# Patient Record
Sex: Female | Born: 1975
Health system: Southern US, Community
[De-identification: ages and names within clinical notes are randomized; demographics above are authoritative.]

## PROBLEM LIST (undated history)

## (undated) DIAGNOSIS — F319 Bipolar disorder, unspecified: Secondary | ICD-10-CM

## (undated) DIAGNOSIS — D219 Benign neoplasm of connective and other soft tissue, unspecified: Secondary | ICD-10-CM

## (undated) DIAGNOSIS — F419 Anxiety disorder, unspecified: Secondary | ICD-10-CM

## (undated) DIAGNOSIS — F329 Major depressive disorder, single episode, unspecified: Secondary | ICD-10-CM

## (undated) DIAGNOSIS — F32A Depression, unspecified: Secondary | ICD-10-CM

## (undated) DIAGNOSIS — J45909 Unspecified asthma, uncomplicated: Secondary | ICD-10-CM

## (undated) HISTORY — DX: Major depressive disorder, single episode, unspecified: F32.9

## (undated) HISTORY — DX: Unspecified asthma, uncomplicated: J45.909

## (undated) HISTORY — DX: Benign neoplasm of connective and other soft tissue, unspecified: D21.9

## (undated) HISTORY — DX: Bipolar disorder, unspecified: F31.9

## (undated) HISTORY — DX: Anxiety disorder, unspecified: F41.9

## (undated) HISTORY — DX: Depression, unspecified: F32.A

---

## 1998-02-04 ENCOUNTER — Emergency Department (HOSPITAL_COMMUNITY): Admission: EM | Admit: 1998-02-04 | Discharge: 1998-02-04 | Payer: Self-pay | Admitting: Emergency Medicine

## 1998-12-07 ENCOUNTER — Emergency Department (HOSPITAL_COMMUNITY): Admission: EM | Admit: 1998-12-07 | Discharge: 1998-12-07 | Payer: Self-pay | Admitting: Emergency Medicine

## 1999-02-26 ENCOUNTER — Inpatient Hospital Stay (HOSPITAL_COMMUNITY): Admission: AD | Admit: 1999-02-26 | Discharge: 1999-02-26 | Payer: Self-pay | Admitting: Obstetrics

## 1999-04-30 ENCOUNTER — Emergency Department (HOSPITAL_COMMUNITY): Admission: EM | Admit: 1999-04-30 | Discharge: 1999-04-30 | Payer: Self-pay | Admitting: *Deleted

## 1999-04-30 ENCOUNTER — Emergency Department (HOSPITAL_COMMUNITY): Admission: EM | Admit: 1999-04-30 | Discharge: 1999-05-01 | Payer: Self-pay | Admitting: Emergency Medicine

## 2000-03-10 ENCOUNTER — Emergency Department (HOSPITAL_COMMUNITY): Admission: EM | Admit: 2000-03-10 | Discharge: 2000-03-10 | Payer: Self-pay | Admitting: Emergency Medicine

## 2000-03-10 ENCOUNTER — Encounter: Payer: Self-pay | Admitting: Emergency Medicine

## 2000-03-29 ENCOUNTER — Inpatient Hospital Stay (HOSPITAL_COMMUNITY): Admission: AD | Admit: 2000-03-29 | Discharge: 2000-03-29 | Payer: Self-pay | Admitting: Obstetrics & Gynecology

## 2000-08-03 ENCOUNTER — Emergency Department (HOSPITAL_COMMUNITY): Admission: EM | Admit: 2000-08-03 | Discharge: 2000-08-04 | Payer: Self-pay | Admitting: Emergency Medicine

## 2001-04-03 ENCOUNTER — Emergency Department (HOSPITAL_COMMUNITY): Admission: EM | Admit: 2001-04-03 | Discharge: 2001-04-03 | Payer: Self-pay | Admitting: Emergency Medicine

## 2001-04-04 ENCOUNTER — Emergency Department (HOSPITAL_COMMUNITY): Admission: EM | Admit: 2001-04-04 | Discharge: 2001-04-04 | Payer: Self-pay

## 2001-05-20 ENCOUNTER — Inpatient Hospital Stay (HOSPITAL_COMMUNITY): Admission: AD | Admit: 2001-05-20 | Discharge: 2001-05-20 | Payer: Self-pay | Admitting: Obstetrics

## 2001-05-20 ENCOUNTER — Encounter: Payer: Self-pay | Admitting: Obstetrics

## 2001-07-18 ENCOUNTER — Other Ambulatory Visit: Admission: RE | Admit: 2001-07-18 | Discharge: 2001-07-18 | Payer: Self-pay | Admitting: Obstetrics and Gynecology

## 2001-08-07 HISTORY — PX: TUBAL LIGATION: SHX77

## 2001-09-09 ENCOUNTER — Other Ambulatory Visit: Admission: RE | Admit: 2001-09-09 | Discharge: 2001-09-09 | Payer: Self-pay | Admitting: Obstetrics and Gynecology

## 2001-12-12 ENCOUNTER — Inpatient Hospital Stay (HOSPITAL_COMMUNITY): Admission: AD | Admit: 2001-12-12 | Discharge: 2001-12-12 | Payer: Self-pay | Admitting: Obstetrics and Gynecology

## 2001-12-19 ENCOUNTER — Inpatient Hospital Stay (HOSPITAL_COMMUNITY): Admission: AD | Admit: 2001-12-19 | Discharge: 2001-12-22 | Payer: Self-pay | Admitting: Obstetrics and Gynecology

## 2001-12-19 ENCOUNTER — Encounter (INDEPENDENT_AMBULATORY_CARE_PROVIDER_SITE_OTHER): Payer: Self-pay

## 2002-12-18 ENCOUNTER — Emergency Department (HOSPITAL_COMMUNITY): Admission: EM | Admit: 2002-12-18 | Discharge: 2002-12-18 | Payer: Self-pay | Admitting: Emergency Medicine

## 2002-12-18 ENCOUNTER — Encounter: Payer: Self-pay | Admitting: Emergency Medicine

## 2003-03-17 ENCOUNTER — Emergency Department (HOSPITAL_COMMUNITY): Admission: EM | Admit: 2003-03-17 | Discharge: 2003-03-17 | Payer: Self-pay | Admitting: Emergency Medicine

## 2005-05-02 ENCOUNTER — Emergency Department (HOSPITAL_COMMUNITY): Admission: EM | Admit: 2005-05-02 | Discharge: 2005-05-02 | Payer: Self-pay | Admitting: Emergency Medicine

## 2005-07-23 ENCOUNTER — Emergency Department (HOSPITAL_COMMUNITY): Admission: EM | Admit: 2005-07-23 | Discharge: 2005-07-23 | Payer: Self-pay | Admitting: Emergency Medicine

## 2005-10-06 ENCOUNTER — Emergency Department (HOSPITAL_COMMUNITY): Admission: EM | Admit: 2005-10-06 | Discharge: 2005-10-06 | Payer: Self-pay | Admitting: Emergency Medicine

## 2006-06-10 ENCOUNTER — Inpatient Hospital Stay (HOSPITAL_COMMUNITY): Admission: AD | Admit: 2006-06-10 | Discharge: 2006-06-10 | Payer: Self-pay | Admitting: Family Medicine

## 2006-08-01 ENCOUNTER — Emergency Department (HOSPITAL_COMMUNITY): Admission: EM | Admit: 2006-08-01 | Discharge: 2006-08-01 | Payer: Self-pay | Admitting: Emergency Medicine

## 2006-11-04 ENCOUNTER — Emergency Department (HOSPITAL_COMMUNITY): Admission: EM | Admit: 2006-11-04 | Discharge: 2006-11-04 | Payer: Self-pay | Admitting: Emergency Medicine

## 2007-12-12 ENCOUNTER — Emergency Department (HOSPITAL_COMMUNITY): Admission: EM | Admit: 2007-12-12 | Discharge: 2007-12-12 | Payer: Self-pay | Admitting: Emergency Medicine

## 2009-06-01 ENCOUNTER — Emergency Department (HOSPITAL_COMMUNITY): Admission: EM | Admit: 2009-06-01 | Discharge: 2009-06-01 | Payer: Self-pay | Admitting: Emergency Medicine

## 2009-12-30 ENCOUNTER — Ambulatory Visit (HOSPITAL_COMMUNITY): Admission: RE | Admit: 2009-12-30 | Discharge: 2009-12-30 | Payer: Self-pay | Admitting: Psychiatry

## 2010-01-05 ENCOUNTER — Other Ambulatory Visit (HOSPITAL_COMMUNITY): Admission: RE | Admit: 2010-01-05 | Discharge: 2010-01-20 | Payer: Self-pay | Admitting: Psychiatry

## 2010-01-06 ENCOUNTER — Ambulatory Visit (HOSPITAL_COMMUNITY): Payer: Self-pay | Admitting: Psychiatry

## 2010-01-24 ENCOUNTER — Ambulatory Visit (HOSPITAL_COMMUNITY): Payer: Self-pay | Admitting: Psychiatry

## 2010-01-27 ENCOUNTER — Ambulatory Visit: Payer: Self-pay | Admitting: Psychiatry

## 2010-01-31 ENCOUNTER — Ambulatory Visit (HOSPITAL_COMMUNITY): Payer: Self-pay | Admitting: Psychiatry

## 2010-02-21 ENCOUNTER — Ambulatory Visit (HOSPITAL_COMMUNITY): Payer: Self-pay | Admitting: Psychiatry

## 2010-08-29 ENCOUNTER — Ambulatory Visit (HOSPITAL_COMMUNITY)
Admission: RE | Admit: 2010-08-29 | Discharge: 2010-08-29 | Payer: Self-pay | Source: Home / Self Care | Attending: Psychiatry | Admitting: Psychiatry

## 2010-09-01 ENCOUNTER — Other Ambulatory Visit (HOSPITAL_COMMUNITY)
Admission: RE | Admit: 2010-09-01 | Discharge: 2010-09-06 | Payer: Self-pay | Source: Home / Self Care | Attending: Psychiatry | Admitting: Psychiatry

## 2010-09-06 ENCOUNTER — Ambulatory Visit (HOSPITAL_COMMUNITY)
Admission: RE | Admit: 2010-09-06 | Discharge: 2010-09-06 | Payer: Self-pay | Source: Home / Self Care | Attending: Psychiatry | Admitting: Psychiatry

## 2010-09-07 ENCOUNTER — Other Ambulatory Visit (HOSPITAL_COMMUNITY): Payer: PRIVATE HEALTH INSURANCE | Attending: Psychiatry | Admitting: Psychiatry

## 2010-09-08 ENCOUNTER — Other Ambulatory Visit (HOSPITAL_COMMUNITY): Payer: PRIVATE HEALTH INSURANCE | Admitting: Psychiatry

## 2010-09-09 ENCOUNTER — Other Ambulatory Visit (HOSPITAL_COMMUNITY): Payer: PRIVATE HEALTH INSURANCE | Admitting: Psychiatry

## 2010-09-12 ENCOUNTER — Other Ambulatory Visit (HOSPITAL_COMMUNITY): Payer: PRIVATE HEALTH INSURANCE | Admitting: Psychiatry

## 2010-09-13 ENCOUNTER — Other Ambulatory Visit (HOSPITAL_COMMUNITY): Payer: PRIVATE HEALTH INSURANCE | Admitting: Psychiatry

## 2010-09-14 ENCOUNTER — Other Ambulatory Visit (HOSPITAL_COMMUNITY): Payer: PRIVATE HEALTH INSURANCE | Admitting: Psychiatry

## 2010-09-15 ENCOUNTER — Other Ambulatory Visit (HOSPITAL_COMMUNITY): Payer: PRIVATE HEALTH INSURANCE | Admitting: Psychiatry

## 2010-09-16 ENCOUNTER — Other Ambulatory Visit (HOSPITAL_COMMUNITY): Payer: PRIVATE HEALTH INSURANCE | Admitting: Psychiatry

## 2010-09-19 ENCOUNTER — Other Ambulatory Visit (HOSPITAL_COMMUNITY): Payer: PRIVATE HEALTH INSURANCE | Admitting: Psychiatry

## 2010-09-20 ENCOUNTER — Other Ambulatory Visit (HOSPITAL_COMMUNITY): Payer: PRIVATE HEALTH INSURANCE | Admitting: Psychiatry

## 2010-09-21 ENCOUNTER — Other Ambulatory Visit (HOSPITAL_COMMUNITY): Payer: PRIVATE HEALTH INSURANCE | Admitting: Psychiatry

## 2010-09-22 ENCOUNTER — Other Ambulatory Visit (HOSPITAL_COMMUNITY): Payer: PRIVATE HEALTH INSURANCE | Admitting: Psychiatry

## 2010-09-23 ENCOUNTER — Other Ambulatory Visit (HOSPITAL_COMMUNITY): Payer: PRIVATE HEALTH INSURANCE | Admitting: Psychiatry

## 2010-09-26 ENCOUNTER — Other Ambulatory Visit (HOSPITAL_COMMUNITY): Payer: PRIVATE HEALTH INSURANCE | Admitting: Psychiatry

## 2010-09-27 ENCOUNTER — Other Ambulatory Visit (HOSPITAL_COMMUNITY): Payer: PRIVATE HEALTH INSURANCE | Admitting: Psychiatry

## 2010-12-23 NOTE — Discharge Summary (Signed)
[facility]  Patient:    Alexis Beck, Alexis Beck Visit Number: [id number] MRN: [id number]          Service Type: OBS Location: [facility] Attending Physician:  Leonard Schwartz Dictated by:   Vance Gather Duplantis, C.N.M. Adm. Date:  12/19/01 Disc. Date: 12/22/01                             Discharge Summary  ADMISSION DIAGNOSES:          1. Intrauterine pregnancy at term.                               2. Active labor.                               3. Multiparity.                               4. Desires bilateral tubal ligation for                                  sterilization.  DISCHARGE DIAGNOSES:          1. Intrauterine pregnancy at term.                               2. Active labor.                               3. Multiparity.                               4. Desires bilateral tubal ligation for                                  sterilization.                               5. Bottle feeding.  PROCEDURE:                    1. Normal spontaneous vaginal delivery of a                                  viable female infant named Alexis Beck who had                                  Apgars of 8 and 9 and weighed 7 pounds 4                                  ounces on Dec 19, 2001 attended by Alexis Lias  Mayford Beck, CNM.                               2. Bilateral tubal ligation for sterilization by                                  Dr. Leonard Schwartz on Dec 20, 2001                                  with no complications.  Please see operative                                  note for details.  HOSPITAL COURSE:              Alexis Beck is a 35 year old gravida 4, para 2-0-1-2 at 60 5/7 weeks who presents complaining of uterine contractions every two to three minutes and was admitted in early active labor.  She progressed rapidly to complete delivery of a viable female infant named Alexis Beck on Dec 20, 2001 who weighed 7  pounds 4 ounces and had Apgars of 8 and 9 attended by Alexis Beck, CNM.  She underwent bilateral tubal ligation for sterilization on the same day by Dr. Leonard Schwartz, also without complications.  Postoperatively and postpartum she has done well.  She is ambulating, voiding, and eating without difficulty.  She is bottle feeding also without difficulty.  She is deemed ready for discharge today.  DISCHARGE INSTRUCTIONS:       As per the [facility] handout.  DISCHARGE MEDICATIONS:        1. Motrin 600 mg p.o. q.6h. p.r.n. for pain.                               2. Tylox one to two p.o. q.4-6h. p.r.n. for                                  pain.                               3. Prenatal vitamins daily.  DISCHARGE LABORATORIES:       Hemoglobin 11.0, WBC 7.4, platelets 189,000.  DISCHARGE FOLLOWUP:           [facility] in six weeks or p.r.n. Dictated by:   Vance Gather Duplantis, C.N.M. Attending Physician:  Leonard Schwartz DD:  12/22/01 TD:  12/24/01 Job: [id number]

## 2010-12-23 NOTE — H&P (Signed)
[facility]  Patient:    Alexis Beck, Alexis Beck Visit Number: [id number] MRN: [id number]          Service Type: OBS Location: [facility] Attending Physician:  Shaune Spittle Dictated by:   Wynelle Bourgeois, CNM Admit Date:  12/12/2001 Discharge Date: 12/12/2001                           History and Physical  HISTORY OF PRESENT ILLNESS:   This is a 35 year old G4, P2-0-1-2, at 38-5/7ths weeks, who presents with complaints of regular uterine contractions x2 hours.  She denies leaking or bleeding and reports positive fetal movement. The pregnancy has been remarkable for: 1. Sickle cell trait.  2. History of rapid labor.  3. History of hypertension with her first pregnancy.  4. History of abnormal Pap.  5. Asthma.  6. Group B strep negative.  OBSTETRICAL HISTORY:          Remarkable for vaginal deliveries in 1993 and 1995 of term infants with no complications other than elevated blood pressure in the first pregnancy.  She had an elective abortion in 1997.  PRENATAL LABORATORIES:        Hemoglobin 13, platelets 188.  Blood type B positive.  Antibody screen negative.  Sickle cell negative.  RPR nonreactive.  Rubella immune.  HBsAg negative.  HIV nonreactive.  Pap test ASCUS.  Gonorrhea negative.  Chlamydia negative.  Three-hour GTT within normal limits.  AFP within normal limits.  Group B strep negative.  MEDICAL HISTORY:              Remarkable for questionable elevated blood pressure in her first pregnancy, history of abnormal Pap in 1991 which was normal after that, history of childhood varicella, history of asthma for which she uses an albuterol inhaler as needed.  FAMILY HISTORY:               Remarkable for grandmother and grandfather and mother with heart disease, mother with hypertension, sister with lung disease, diabetes in her grandmother, renal failure in her grandmother, bladder cancer in her grandmother, lung cancer in her grandfather, two  cousins with psychiatric diseases.  GENETIC HISTORY:              Remarkable for patient, father, brother and daughter with sickle cell trait.  SOCIAL HISTORY:               The patient is married to [employer] who involved and supportive.  She is of the [place] faith.  She denies any alcohol, tobacco or drug use.  OBJECTIVE DATA:  VITAL SIGNS:                  Stable, afebrile.  HEENT:                        Within normal limits.  NECK:                         Thyroid normal, not enlarged.  CHEST:                        Clear to auscultation.  HEART RATE:                   Regular rate and rhythm.  ABDOMEN:  Gravid at 38 cm.  EFM shows a reactive fetal heart rate with uterine contractions every 2-3 minutes.  Amniotomy was performed for clear fluid.  PELVIC:                       Cervical exam 4, 80%, -1 station with a vertex presentation.  EXTREMITIES:                  Within normal limits.  ASSESSMENT: 1. Intrauterine pregnancy at 38-5/7ths weeks. 2. Active labor.  PLAN: 1. Admit to birthing suites, Dr. Pennie Rushing notified. 2. Routine CNM orders. Dictated by:   Wynelle Bourgeois, CNM Attending Physician:  Shaune Spittle DD:  12/19/01 TD:  12/20/01 Job: [id number]

## 2010-12-23 NOTE — Op Note (Signed)
[facility]  Patient:    Alexis Beck, Alexis Beck Visit Number: [id number] MRN: [id number]          Service Type: OBS Location: [facility] Attending Physician:  Leonard Schwartz Dictated by:   Janine Limbo, M.D. Proc. Date: 12/20/01                             Operative Report  PREOPERATIVE DIAGNOSES:       1. Postpartum day 0.                               2. Desires permanent sterilization.  POSTOPERATIVE DIAGNOSES:      1. Postpartum day 0.                               2. Desires permanent sterilization.  PROCEDURE:                    Postpartum bilateral tubal ligation.  SURGEON:                      Janine Limbo, M.D.  ANESTHESIA:                   General.  DISPOSITION:                  [name] is a 35 year old female now para 3-0-1-3 who had a vaginal delivery of a healthy female infant earlier today.  She desires permanent sterilization.  She understands the indications for her procedure and she accepts the risks of, but not limited to, anesthetic complications, bleeding, infections, possible damage to the surrounding organs, and possible tubal failure (17 per 1000).  FINDINGS:                     The fallopian tubes were normal bilaterally.  PROCEDURE:                    The patient was taken to the operating room where a general anesthetic was given.  The patients abdomen was prepped with multiple layers of Betadine and then sterilely draped.  The subumbilical area was injected with 10 cc of 0.5% Marcaine with epinephrine.  A subumbilical incision was made and the incision was carried sharply through the subcutaneous tissue, the fascia, and the anterior peritoneum.  The left fallopian tube was identified and followed to its fimbriated end.  A knuckle of tube was made on the left using a free tie and then a suture ligature of 0 plain catgut.  The knuckle of tube thus made was excised.  Hemostasis was adequate.   An identical procedure was carried out on the opposite side.  Once again, hemostasis was adequate.  All instruments were removed.  The fascia and the anterior peritoneum were reapproximated in the midline using 2-0 Vicryl. The skin was reapproximated using a subcuticular suture of 4-0 Vicryl. Sponge, needle, and instrument counts were correct on two occasions.  The estimated blood loss was 3 cc.  The patient tolerated her procedure well.  She was awakened from her anesthetic and taken to the recovery room in stable condition. Dictated by:   Janine Limbo, M.D. Attending Physician:  Leonard Schwartz DD:  12/20/01 TD:  12/23/01 Job: [phone] [id number]

## 2011-03-13 ENCOUNTER — Ambulatory Visit (HOSPITAL_COMMUNITY)
Admission: RE | Admit: 2011-03-13 | Discharge: 2011-03-13 | Disposition: A | Discharge status: Home / Self Care | Payer: 59 | Attending: Psychiatry | Admitting: Psychiatry

## 2011-03-13 DIAGNOSIS — F331 Major depressive disorder, recurrent, moderate: Secondary | ICD-10-CM | POA: Insufficient documentation

## 2011-03-14 ENCOUNTER — Other Ambulatory Visit (HOSPITAL_COMMUNITY): Payer: PRIVATE HEALTH INSURANCE | Admitting: Psychiatry

## 2011-03-15 ENCOUNTER — Other Ambulatory Visit (HOSPITAL_COMMUNITY): Payer: PRIVATE HEALTH INSURANCE | Attending: Psychiatry | Admitting: Psychiatry

## 2011-03-15 DIAGNOSIS — F331 Major depressive disorder, recurrent, moderate: Secondary | ICD-10-CM | POA: Insufficient documentation

## 2011-03-15 DIAGNOSIS — J45909 Unspecified asthma, uncomplicated: Secondary | ICD-10-CM | POA: Insufficient documentation

## 2011-03-15 DIAGNOSIS — IMO0002 Reserved for concepts with insufficient information to code with codable children: Secondary | ICD-10-CM | POA: Insufficient documentation

## 2011-03-15 DIAGNOSIS — Z6379 Other stressful life events affecting family and household: Secondary | ICD-10-CM | POA: Insufficient documentation

## 2011-03-16 ENCOUNTER — Other Ambulatory Visit (HOSPITAL_COMMUNITY): Payer: PRIVATE HEALTH INSURANCE | Admitting: Psychiatry

## 2011-03-17 ENCOUNTER — Other Ambulatory Visit (HOSPITAL_COMMUNITY): Payer: PRIVATE HEALTH INSURANCE | Admitting: Psychiatry

## 2011-03-20 ENCOUNTER — Other Ambulatory Visit (HOSPITAL_BASED_OUTPATIENT_CLINIC_OR_DEPARTMENT_OTHER): Payer: PRIVATE HEALTH INSURANCE | Admitting: Psychiatry

## 2011-03-20 DIAGNOSIS — F319 Bipolar disorder, unspecified: Secondary | ICD-10-CM

## 2011-03-21 ENCOUNTER — Other Ambulatory Visit (HOSPITAL_COMMUNITY): Payer: PRIVATE HEALTH INSURANCE | Admitting: Psychiatry

## 2011-03-22 ENCOUNTER — Other Ambulatory Visit (HOSPITAL_COMMUNITY): Payer: PRIVATE HEALTH INSURANCE | Admitting: Psychiatry

## 2011-03-23 ENCOUNTER — Other Ambulatory Visit (HOSPITAL_COMMUNITY): Payer: PRIVATE HEALTH INSURANCE | Admitting: Psychiatry

## 2011-03-24 ENCOUNTER — Other Ambulatory Visit (HOSPITAL_COMMUNITY): Payer: PRIVATE HEALTH INSURANCE | Admitting: Psychiatry

## 2011-03-27 ENCOUNTER — Other Ambulatory Visit (HOSPITAL_COMMUNITY): Payer: PRIVATE HEALTH INSURANCE | Admitting: Psychiatry

## 2011-03-28 ENCOUNTER — Other Ambulatory Visit (HOSPITAL_COMMUNITY): Payer: PRIVATE HEALTH INSURANCE | Admitting: Psychiatry

## 2011-03-29 ENCOUNTER — Other Ambulatory Visit (HOSPITAL_COMMUNITY): Payer: PRIVATE HEALTH INSURANCE | Admitting: Psychiatry

## 2011-03-30 ENCOUNTER — Other Ambulatory Visit (HOSPITAL_COMMUNITY): Payer: PRIVATE HEALTH INSURANCE | Admitting: Psychiatry

## 2011-03-31 ENCOUNTER — Other Ambulatory Visit (HOSPITAL_COMMUNITY): Payer: PRIVATE HEALTH INSURANCE | Admitting: Psychiatry

## 2011-04-03 ENCOUNTER — Other Ambulatory Visit (HOSPITAL_COMMUNITY): Payer: PRIVATE HEALTH INSURANCE | Admitting: Psychiatry

## 2011-04-04 ENCOUNTER — Other Ambulatory Visit (HOSPITAL_COMMUNITY): Payer: PRIVATE HEALTH INSURANCE | Admitting: Psychiatry

## 2011-04-05 ENCOUNTER — Other Ambulatory Visit (HOSPITAL_COMMUNITY): Payer: PRIVATE HEALTH INSURANCE

## 2011-04-06 ENCOUNTER — Other Ambulatory Visit (HOSPITAL_COMMUNITY): Payer: PRIVATE HEALTH INSURANCE

## 2011-04-07 ENCOUNTER — Other Ambulatory Visit (HOSPITAL_COMMUNITY): Payer: PRIVATE HEALTH INSURANCE

## 2011-04-17 ENCOUNTER — Other Ambulatory Visit (HOSPITAL_COMMUNITY): Payer: PRIVATE HEALTH INSURANCE | Admitting: Psychiatry

## 2012-01-25 ENCOUNTER — Ambulatory Visit (HOSPITAL_COMMUNITY)
Admission: RE | Admit: 2012-01-25 | Discharge: 2012-01-25 | Disposition: A | Discharge status: Home / Self Care | Payer: 59 | Attending: Psychiatry | Admitting: Psychiatry

## 2012-01-25 DIAGNOSIS — Z658 Other specified problems related to psychosocial circumstances: Secondary | ICD-10-CM | POA: Insufficient documentation

## 2012-01-25 DIAGNOSIS — F411 Generalized anxiety disorder: Secondary | ICD-10-CM | POA: Insufficient documentation
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
## 2012-02-01 ENCOUNTER — Other Ambulatory Visit (HOSPITAL_COMMUNITY): Payer: PRIVATE HEALTH INSURANCE | Attending: Psychiatry

## 2012-02-01 ENCOUNTER — Telehealth (HOSPITAL_COMMUNITY): Payer: Self-pay | Admitting: Psychiatry

## 2012-02-01 ENCOUNTER — Encounter (HOSPITAL_COMMUNITY): Payer: Self-pay

## 2012-02-01 DIAGNOSIS — F332 Major depressive disorder, recurrent severe without psychotic features: Secondary | ICD-10-CM | POA: Insufficient documentation

## 2012-02-01 DIAGNOSIS — F3132 Bipolar disorder, current episode depressed, moderate: Secondary | ICD-10-CM

## 2012-02-01 DIAGNOSIS — J45909 Unspecified asthma, uncomplicated: Secondary | ICD-10-CM | POA: Insufficient documentation

## 2012-02-01 DIAGNOSIS — F429 Obsessive-compulsive disorder, unspecified: Secondary | ICD-10-CM | POA: Insufficient documentation

## 2012-02-01 NOTE — Progress Notes (Signed)
    Daily Group Progress Note  Program: IOP  Group Time: 9:00-10:30 am   Participation Level: Active  Behavioral Response: Appropriate  Type of Therapy:  Process Group  Summary of Progress: Patient was new to the group today and talked about how she is returning to the IOP due to the return of her depression symptoms. She states her doctor recently diagnosed her with Borderline Personality Disorder and she is hoping to learn better tools to have healthier relationships with others.      Group Time: 10:30 am - 12:00 pm   Participation Level:  Active  Behavioral Response: Appropriate  Type of Therapy: Psycho-education Group  Summary of Progress:  Patient participated in a relaxation exercise using Progressive Muscle Relaxation as a tool to manage anxiety symptoms.   Maxcine Ham, MSW, LCSW
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
## 2012-02-02 ENCOUNTER — Other Ambulatory Visit (HOSPITAL_COMMUNITY): Payer: PRIVATE HEALTH INSURANCE

## 2012-02-02 MED ORDER — ESCITALOPRAM OXALATE 20 MG PO TABS: 20.0000 mg | ORAL_TABLET | Freq: Every day | ORAL | Status: DC
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
## 2012-02-02 NOTE — Progress Notes (Signed)
    Daily Group Progress Note  Program: IOP  Group Time: 9:00-10:30 am   Participation Level: Active  Behavioral Response: Appropriate  Type of Therapy:  Process Group  Summary of Progress:   Patient participated in a goodbye ceremony for two members ending the program and expressed hopes for them going forward.       Group Time: 10:30 am - 12:00 pm  Participation Level:  Active  Behavioral Response: Appropriate  Type of Therapy: Psycho-education Group  Summary of Progress:   Patient participated in a goodbye ceremony for two members ending the program and expressed hopes for them going forward.   Maxcine Ham, MSW, LCSW

## 2012-02-05 ENCOUNTER — Other Ambulatory Visit (HOSPITAL_COMMUNITY): Payer: PRIVATE HEALTH INSURANCE | Attending: Psychiatry

## 2012-02-05 ENCOUNTER — Ambulatory Visit (INDEPENDENT_AMBULATORY_CARE_PROVIDER_SITE_OTHER): Payer: 59 | Admitting: Licensed Clinical Social Worker

## 2012-02-05 DIAGNOSIS — F411 Generalized anxiety disorder: Secondary | ICD-10-CM | POA: Insufficient documentation

## 2012-02-05 DIAGNOSIS — F332 Major depressive disorder, recurrent severe without psychotic features: Secondary | ICD-10-CM | POA: Insufficient documentation

## 2012-02-05 DIAGNOSIS — F4323 Adjustment disorder with mixed anxiety and depressed mood: Secondary | ICD-10-CM

## 2012-02-05 DIAGNOSIS — J45909 Unspecified asthma, uncomplicated: Secondary | ICD-10-CM | POA: Insufficient documentation

## 2012-02-05 DIAGNOSIS — F429 Obsessive-compulsive disorder, unspecified: Secondary | ICD-10-CM | POA: Insufficient documentation

## 2012-02-06 ENCOUNTER — Other Ambulatory Visit (HOSPITAL_COMMUNITY): Payer: PRIVATE HEALTH INSURANCE

## 2012-02-07 ENCOUNTER — Telehealth (HOSPITAL_COMMUNITY): Payer: Self-pay | Admitting: Psychiatry

## 2012-02-07 ENCOUNTER — Other Ambulatory Visit (HOSPITAL_COMMUNITY): Payer: PRIVATE HEALTH INSURANCE

## 2012-02-09 ENCOUNTER — Other Ambulatory Visit (HOSPITAL_COMMUNITY): Payer: PRIVATE HEALTH INSURANCE

## 2012-02-12 ENCOUNTER — Other Ambulatory Visit (HOSPITAL_COMMUNITY): Payer: PRIVATE HEALTH INSURANCE

## 2012-02-12 ENCOUNTER — Encounter (HOSPITAL_COMMUNITY): Payer: Self-pay

## 2012-02-12 ENCOUNTER — Ambulatory Visit: Payer: Self-pay | Admitting: Licensed Clinical Social Worker

## 2012-02-12 DIAGNOSIS — F419 Anxiety disorder, unspecified: Secondary | ICD-10-CM | POA: Insufficient documentation

## 2012-02-12 DIAGNOSIS — F329 Major depressive disorder, single episode, unspecified: Secondary | ICD-10-CM | POA: Insufficient documentation

## 2012-02-12 NOTE — Progress Notes (Signed)
    Daily Group Progress Note  Program: IOP  Group Time: 9:00-10:30 am   Participation Level: Active  Behavioral Response: Appropriate  Type of Therapy:  Process Group  Summary of Progress: Patient expressed high anxiety and depression resulting from stress in her home with her husband and children as well as the added stressor of having car problems that has been interfering with her transportation to and from appointments. She described having a stressful weekend where her oldest daughter moved out due to not liking the household rules and how the conflict with her husband has escalated to him sleeping on the couch. Patient is questioning her effectiveness as a mother which is increasing her depression and anxiety symptoms.      Group Time: 10:30 am - 12:00 pm   Participation Level:  None  Behavioral Response: None  Type of Therapy: Grief and Loss  Summary of Progress: Patient left group to pick up her husband due to car problems and was not present for this group.   Maxcine Ham, MSW, LCSW

## 2012-02-13 ENCOUNTER — Other Ambulatory Visit (HOSPITAL_COMMUNITY): Payer: PRIVATE HEALTH INSURANCE

## 2012-02-13 NOTE — Progress Notes (Signed)
    Daily Group Progress Note  Program: IOP  Group Time: 9:00-10:30 am   Participation Level: Active  Behavioral Response: Appropriate  Type of Therapy:  Process Group  Summary of Progress: Patient reports high stress and depression from work and family situations. She is struggling with ways to manage the symptoms and create mood stability.      Group Time: 10:30 am - 12:00 pm   Participation Level:  Active  Behavioral Response: Appropriate  Type of Therapy: Psycho-education Group  Summary of Progress: Patient participated in a discussion on the symptoms of depression and anxiety and identified personal symptoms associated with the conditions.   Maxcine Ham, MSW, LCSW

## 2012-02-14 ENCOUNTER — Other Ambulatory Visit (HOSPITAL_COMMUNITY): Payer: PRIVATE HEALTH INSURANCE

## 2012-02-14 NOTE — Progress Notes (Signed)
    Daily Group Progress Note  Program: IOP  Group Time: 9:00-10:30 am   Participation Level: Active  Behavioral Response: Appropriate  Type of Therapy:  Process Group  Summary of Progress: Patient reports high depression and anxiety. She identified with another members in feeling "overwhelmed" and taking on more than she can handle, but has not provided an update on her stressors in a few days. She said she was disappointed when she learned her last day in the group would be this Friday.      Group Time: 10:30 am - 12:00 pm   Participation Level:  Active  Behavioral Response: Appropriate  Type of Therapy: Psycho-education Group  Summary of Progress: Patient participated in a discussion on defining group therapy and barriers that could impact making the most progress in treatment as well as planning on how to avoid those pitfalls.   Maxcine Ham, MSW, LCSW

## 2012-02-15 ENCOUNTER — Other Ambulatory Visit (HOSPITAL_COMMUNITY): Payer: PRIVATE HEALTH INSURANCE

## 2012-02-15 NOTE — Progress Notes (Signed)
    Daily Group Progress Note  Program: IOP  Group Time: 9:00-10:30 am   Participation Level: Active  Behavioral Response: Appropriate  Type of Therapy:  Process Group  Summary of Progress: Patient reports feeling "good" today. She described how she is trying to set more realistic expectations of her family and not do more than she can handle. She described how she is trying to take personal responsibility over her wellness.      Group Time: 10:30 am - 12:00 pm   Participation Level:  Active  Behavioral Response: Appropriate  Type of Therapy: Psycho-education Group  Summary of Progress: Patient learned about creating a daily maintainence  list to ensure wellness and monitor depression symptoms and personalized their own individualized list.   Maxcine Ham, MSW, LCSW

## 2012-02-16 ENCOUNTER — Other Ambulatory Visit (HOSPITAL_COMMUNITY): Payer: PRIVATE HEALTH INSURANCE

## 2012-02-16 NOTE — Progress Notes (Signed)
    Daily Group Progress Note  Program: IOP  Group Time: 9:00-10:30 am   Participation Level: Active  Behavioral Response: Appropriate  Type of Therapy:  Process Group  Summary of Progress: Patient reports less depression and anxiety and processed decision she has made recently that have gotten her in bad situations and how she is learning to better manage herself and her moods. She reports feeling ready to end the group and return to her job on Monday.      Group Time: 10:30 am - 12:00 pm   Participation Level:  Active  Behavioral Response: Appropriate  Type of Therapy: Psycho-education Group  Summary of Progress: Patient participated in a goodbye ceremony to say goodbye to other members.   Maxcine Ham, MSW, LCSW

## 2012-02-16 NOTE — Patient Instructions (Signed)
Patient will follow up with Dr. Gwyndolyn Kaufman and Danice Goltz, [facility].  Encouraged support groups.  Return to work on 02-19-12 without any restrictions.

## 2012-02-16 NOTE — Progress Notes (Signed)
Patient ID: Johnye Kist, female   DOB: 04-09-1976, 36 y.o.   MRN: [id number] D:  This is a 11 married african [place] female who was referred per assessment department, treatment for bipolar disorder (depressed) with SI.  Denies SI/HI or A/V hallucinations.  Reports decreased anxiety and depression.  States sleep, concentration and appetite have improved.  Decreased irritability.  Reports that she has been working on being more positive.  "I enjoyed th WRAP (wellness, recovery, action plan) work in the groups."  A:  D/C today.  F/U with Dr. Gwyndolyn Kaufman and Danice Goltz, [facility].  Encouraged support groups.  RTW on 02-19-12 without any restrictions.  R:  Pt receptive.

## 2012-02-19 ENCOUNTER — Other Ambulatory Visit (HOSPITAL_COMMUNITY): Payer: PRIVATE HEALTH INSURANCE

## 2012-02-20 ENCOUNTER — Other Ambulatory Visit (HOSPITAL_COMMUNITY): Payer: PRIVATE HEALTH INSURANCE

## 2012-02-21 ENCOUNTER — Other Ambulatory Visit (HOSPITAL_COMMUNITY): Payer: PRIVATE HEALTH INSURANCE

## 2012-02-22 ENCOUNTER — Other Ambulatory Visit (HOSPITAL_COMMUNITY): Payer: PRIVATE HEALTH INSURANCE

## 2012-02-22 NOTE — Progress Notes (Signed)
  [facility] Intensive Outpatient Program Discharge Summary  [name] [id number]  Discharge Note  Patient:  Alexis Beck is an 36 y.o., female DOB:  July 04, 1976  Date of Admission:  02-02-12  Date of Discharge:  02-16-12  Reason for Admission: Depression and anxiety  Hospital Course: Patient started the IOP and because of her depression was started on Lexapro 10 mg which was later increased to 20 mg. She tolerated this medication well and participated actively in groups. She dealt with multiple issues regarding her family and previous traumas and did very well. Her sleep and appetite were good, her mood and anxiety had improved significantly and she had nose suicidal or homicidal ideation and was coping well and tolerating her medications well.  Mental Status at Discharge: Alert, oriented x3, affect was bright mood was euthymic speech was normal. No suicidal or homicidal ideation. No hallucinations or delusions. Recent and remote memory were good, judgment and insight were good, concentration and recall were good.  Lab Results: No results found for this or any previous visit (from the past 48 hour(s)).  Current outpatient prescriptions:escitalopram (LEXAPRO) 20 MG tablet, Take 1 tablet (20 mg total) by mouth daily., Disp: 30 tablet, Rfl: 0  Axis Diagnosis:   Axis I: Major Depression, Recurrent severe and Obsessive Compulsive Disorder Axis II: Deferred Axis III:  Past Medical History  Diagnosis Date  . Asthma   . Anxiety   . Bipolar disorder   . Depression    Axis IV: economic problems, occupational problems, other psychosocial or environmental problems, problems related to social environment and problems with primary support group Axis V: 61-70 mild symptoms   Level of Care:  OP  Discharge destination:  Home  Is patient on multiple antipsychotic therapies at discharge:  No    Has Patient had three or more failed trials of antipsychotic monotherapy by  history:  No  Patient phone:  [phone] (home)  Patient address:   [address] [place] [address],   Follow-up recommendations:  Activity:  As tolerated Diet:  Regular Other:  Followup with Dr. Gwyndolyn Kaufman for medications and Lestine Mount for therapy  Comments:  None  The patient received suicide prevention pamphlet:  Yes Belongings returned:  Valuables  Margit Banda 02/22/2012, 1:25 PM   [facility] 02/22/2012

## 2012-02-23 ENCOUNTER — Other Ambulatory Visit (HOSPITAL_COMMUNITY): Payer: PRIVATE HEALTH INSURANCE

## 2012-02-26 ENCOUNTER — Other Ambulatory Visit (HOSPITAL_COMMUNITY): Payer: PRIVATE HEALTH INSURANCE

## 2012-02-27 ENCOUNTER — Other Ambulatory Visit (HOSPITAL_COMMUNITY): Payer: PRIVATE HEALTH INSURANCE

## 2012-02-28 ENCOUNTER — Other Ambulatory Visit (HOSPITAL_COMMUNITY): Payer: PRIVATE HEALTH INSURANCE

## 2012-02-29 ENCOUNTER — Other Ambulatory Visit (HOSPITAL_COMMUNITY): Payer: PRIVATE HEALTH INSURANCE

## 2013-03-06 ENCOUNTER — Emergency Department (HOSPITAL_COMMUNITY)
Admission: EM | Admit: 2013-03-06 | Discharge: 2013-03-06 | Disposition: A | Discharge status: Home / Self Care | Payer: Self-pay | Attending: Emergency Medicine | Admitting: Emergency Medicine

## 2013-03-06 ENCOUNTER — Encounter (HOSPITAL_COMMUNITY): Payer: Self-pay | Admitting: Cardiology

## 2013-03-06 DIAGNOSIS — J4521 Mild intermittent asthma with (acute) exacerbation: Secondary | ICD-10-CM

## 2013-03-06 DIAGNOSIS — Z79899 Other long term (current) drug therapy: Secondary | ICD-10-CM | POA: Insufficient documentation

## 2013-03-06 DIAGNOSIS — Z8659 Personal history of other mental and behavioral disorders: Secondary | ICD-10-CM | POA: Insufficient documentation

## 2013-03-06 DIAGNOSIS — J45901 Unspecified asthma with (acute) exacerbation: Secondary | ICD-10-CM | POA: Insufficient documentation

## 2013-03-06 MED ORDER — ALBUTEROL SULFATE HFA 108 (90 BASE) MCG/ACT IN AERS
2.0000 | INHALATION_SPRAY | RESPIRATORY_TRACT | Status: DC | PRN
  Administered 2013-03-06: 2 via RESPIRATORY_TRACT
  Filled 2013-03-06: qty 6.7

## 2013-03-06 MED ORDER — PREDNISONE 20 MG PO TABS: 40.0000 mg | ORAL_TABLET | Freq: Every day | ORAL | Status: DC

## 2013-03-06 MED ORDER — IPRATROPIUM BROMIDE 0.02 % IN SOLN
RESPIRATORY_TRACT | Status: AC
  Administered 2013-03-06: 0.5 mg
  Filled 2013-03-06: qty 2.5

## 2013-03-06 MED ORDER — ALBUTEROL SULFATE (5 MG/ML) 0.5% IN NEBU
INHALATION_SOLUTION | RESPIRATORY_TRACT | Status: AC
  Administered 2013-03-06: 5 mg
  Filled 2013-03-06: qty 1

## 2013-03-06 MED ORDER — PREDNISONE 20 MG PO TABS
60.0000 mg | ORAL_TABLET | Freq: Once | ORAL | Status: AC
  Administered 2013-03-06: 60 mg via ORAL
  Filled 2013-03-06: qty 3

## 2013-03-06 NOTE — ED Notes (Signed)
Pt reports a hx of asthma- states that since Friday she has been feeling SOB. States that when she uses her inhaler that she only gets about 15 minutes of relief. Pt able to speak in complete sentences. Pt is winded when ambulating.

## 2013-03-06 NOTE — ED Provider Notes (Signed)
Medical screening examination/treatment/procedure(s) were performed by non-physician practitioner and as supervising physician I was immediately available for consultation/collaboration.  Ethelda Chick, MD 03/06/13 [phone]

## 2013-03-06 NOTE — ED Notes (Signed)
Pt discharged.Vital signs stable and GCS 15 

## 2013-03-06 NOTE — ED Provider Notes (Signed)
CSN: [id number]     Arrival date & time 03/06/13  1409 History     First MD Initiated Contact with Patient 03/06/13 1420     Chief Complaint  Patient presents with  . Shortness of Breath   (Consider location/radiation/quality/duration/timing/severity/associated sxs/prior Treatment) HPI Comments: Patient presents to the emergency department with chief complaint of asthma exacerbation. She states that she has been noticing increasing wheezing and shortness of breath since last Friday. She states that she has been using her inhaler, but she ran out, and began using her mothers. She states that she only gets about 15 minutes relief. She states that it is exacerbated with activity. She denies fevers, chills, cough, chest pain, nausea, vomiting, diarrhea, constipation. She has had asthma exacerbations in the past.  The history is provided by the patient. No language interpreter was used.    Past Medical History  Diagnosis Date  . Asthma   . Anxiety   . Bipolar disorder   . Depression    History reviewed. No pertinent past surgical history. Family History  Problem Relation Age of Onset  . Drug abuse Mother   . Drug abuse Father   . Depression Brother   . Depression Maternal Grandmother   . Schizophrenia Cousin    History  Substance Use Topics  . Smoking status: Unknown If Ever Smoked  . Smokeless tobacco: Not on file  . Alcohol Use: No   OB History   Grav Para Term Preterm Abortions TAB SAB Ect Mult Living                 Review of Systems  All other systems reviewed and are negative.    Allergies  Review of patient's allergies indicates no known allergies.  Home Medications   Current Outpatient Rx  Name  Route  Sig  Dispense  Refill  . albuterol (PROVENTIL HFA;VENTOLIN HFA) 108 (90 BASE) MCG/ACT inhaler   Inhalation   Inhale 2 puffs into the lungs every 6 (six) hours as needed for wheezing or shortness of breath.         . predniSONE (DELTASONE) 20 MG tablet  Oral   Take 2 tablets (40 mg total) by mouth daily.   10 tablet   0    BP 129/64  Pulse 101  Temp(Src) 98.3 F (36.8 C) (Oral)  Resp 20 Physical Exam  Nursing note and vitals reviewed. Constitutional: She is oriented to person, place, and time. She appears well-developed and well-nourished.  HENT:  Head: Normocephalic and atraumatic.  Eyes: Conjunctivae and EOM are normal. Pupils are equal, round, and reactive to light.  Neck: Normal range of motion. Neck supple.  Cardiovascular: Normal rate and regular rhythm.  Exam reveals no gallop and no friction rub.   No murmur heard. Pulmonary/Chest: Effort normal and breath sounds normal. No respiratory distress. She has no wheezes. She has no rales. She exhibits no tenderness.  Abdominal: Soft. Bowel sounds are normal. She exhibits no distension and no mass. There is no tenderness. There is no rebound and no guarding.  Musculoskeletal: Normal range of motion. She exhibits no edema and no tenderness.  Neurological: She is alert and oriented to person, place, and time.  Skin: Skin is warm and dry.  Psychiatric: She has a normal mood and affect. Her behavior is normal. Judgment and thought content normal.    ED Course   Procedures (including critical care time)  Labs Reviewed - No data to display No results found. 1. Asthma exacerbation, mild  intermittent     MDM  Patient with asthma exacerbation. She received a breathing treatment prior to my initial evaluation. Following the breathing treatment, she did not have any wheezing. She states that her breathing has improved dramatically. She states that she feels much better.  Patient ambulated in ED with O2 saturations maintained >90, no current signs of respiratory distress. Lung exam improved after nebulizer treatment. Prednisone given in the ED and pt will bd dc with 5 day burst. Pt states they are breathing at baseline. Pt has been instructed to continue using prescribed medications and  to speak with PCP about today's exacerbation.    Roxy Horseman, PA-C 03/06/13 1541

## 2013-03-06 NOTE — ED Notes (Signed)
SPO2 98 when walking.

## 2013-03-08 ENCOUNTER — Observation Stay (HOSPITAL_COMMUNITY)
Admission: EM | Admit: 2013-03-08 | Discharge: 2013-03-09 | Disposition: A | Discharge status: Home / Self Care | Payer: MEDICAID | Attending: Internal Medicine | Admitting: Internal Medicine

## 2013-03-08 ENCOUNTER — Encounter (HOSPITAL_COMMUNITY): Payer: Self-pay | Admitting: Emergency Medicine

## 2013-03-08 DIAGNOSIS — T7840XA Allergy, unspecified, initial encounter: Secondary | ICD-10-CM

## 2013-03-08 DIAGNOSIS — R220 Localized swelling, mass and lump, head: Secondary | ICD-10-CM

## 2013-03-08 DIAGNOSIS — J45909 Unspecified asthma, uncomplicated: Secondary | ICD-10-CM

## 2013-03-08 DIAGNOSIS — D509 Iron deficiency anemia, unspecified: Secondary | ICD-10-CM | POA: Insufficient documentation

## 2013-03-08 DIAGNOSIS — Z23 Encounter for immunization: Secondary | ICD-10-CM | POA: Insufficient documentation

## 2013-03-08 DIAGNOSIS — T782XXA Anaphylactic shock, unspecified, initial encounter: Principal | ICD-10-CM

## 2013-03-08 DIAGNOSIS — R000 Tachycardia, unspecified: Secondary | ICD-10-CM | POA: Insufficient documentation

## 2013-03-08 DIAGNOSIS — F319 Bipolar disorder, unspecified: Secondary | ICD-10-CM | POA: Insufficient documentation

## 2013-03-08 DIAGNOSIS — R062 Wheezing: Secondary | ICD-10-CM

## 2013-03-08 DIAGNOSIS — I10 Essential (primary) hypertension: Secondary | ICD-10-CM | POA: Insufficient documentation

## 2013-03-08 DIAGNOSIS — R0600 Dyspnea, unspecified: Secondary | ICD-10-CM

## 2013-03-08 LAB — CBC
HCT: 34.8 % — ABNORMAL LOW (ref 36.0–46.0)
Hemoglobin: 11.1 g/dL — ABNORMAL LOW (ref 12.0–15.0)
MCV: 75.8 fL — ABNORMAL LOW (ref 78.0–100.0)
RBC: 4.59 MIL/uL (ref 3.87–5.11)
RDW: 16.6 % — ABNORMAL HIGH (ref 11.5–15.5)
WBC: 6.4 10*3/uL (ref 4.0–10.5)

## 2013-03-08 LAB — CREATININE, SERUM
GFR calc Af Amer: >90 mL/min (ref >90)
GFR calc non Af Amer: 79 mL/min — ABNORMAL LOW (ref >90)

## 2013-03-08 LAB — CBC WITH DIFFERENTIAL/PLATELET
Basophils Relative: 0 % (ref 0–1)
Eosinophils Absolute: 0.2 10*3/uL (ref 0.0–0.7)
Hemoglobin: 11.6 g/dL — ABNORMAL LOW (ref 12.0–15.0)
MCH: 24.4 pg — ABNORMAL LOW (ref 26.0–34.0)
MCHC: 32.2 g/dL (ref 30.0–36.0)
Neutro Abs: 3.9 10*3/uL (ref 1.7–7.7)
Neutrophils Relative %: 47 % (ref 43–77)
Platelets: 225 10*3/uL (ref 150–400)
RBC: 4.76 MIL/uL (ref 3.87–5.11)

## 2013-03-08 LAB — BASIC METABOLIC PANEL
Chloride: 104 mEq/L (ref 96–112)
GFR calc Af Amer: 85 mL/min — ABNORMAL LOW (ref >90)
GFR calc non Af Amer: 73 mL/min — ABNORMAL LOW (ref >90)
Potassium: 4.4 mEq/L (ref 3.5–5.1)
Sodium: 140 mEq/L (ref 135–145)

## 2013-03-08 LAB — TROPONIN I
Troponin I: <0.3 ng/mL (ref <0.30)
Troponin I: <0.3 ng/mL (ref <0.30)
Troponin I: <0.3 ng/mL (ref <0.30)

## 2013-03-08 MED ORDER — SODIUM CHLORIDE 0.9 % IV SOLN
INTRAVENOUS | Status: DC
  Administered 2013-03-08 (×2): via INTRAVENOUS

## 2013-03-08 MED ORDER — DIPHENHYDRAMINE HCL 50 MG/ML IJ SOLN
50.0000 mg | Freq: Four times a day (QID) | INTRAMUSCULAR | Status: DC | PRN
  Filled 2013-03-08: qty 1

## 2013-03-08 MED ORDER — SODIUM CHLORIDE 0.9 % IJ SOLN: 3.0000 mL | Freq: Two times a day (BID) | INTRAMUSCULAR | Status: DC

## 2013-03-08 MED ORDER — DIPHENHYDRAMINE HCL 50 MG/ML IJ SOLN
50.0000 mg | Freq: Once | INTRAMUSCULAR | Status: AC
  Administered 2013-03-08: 50 mg via INTRAVENOUS
  Filled 2013-03-08: qty 1

## 2013-03-08 MED ORDER — PNEUMOCOCCAL VAC POLYVALENT 25 MCG/0.5ML IJ INJ
0.5000 mL | INJECTION | INTRAMUSCULAR | Status: AC
  Administered 2013-03-09: 0.5 mL via INTRAMUSCULAR
  Filled 2013-03-08: qty 0.5

## 2013-03-08 MED ORDER — ACETAMINOPHEN 650 MG RE SUPP: 650.0000 mg | Freq: Four times a day (QID) | RECTAL | Status: DC | PRN

## 2013-03-08 MED ORDER — LEVALBUTEROL HCL 0.63 MG/3ML IN NEBU: 0.6300 mg | INHALATION_SOLUTION | Freq: Four times a day (QID) | RESPIRATORY_TRACT | Status: DC | PRN

## 2013-03-08 MED ORDER — FAMOTIDINE IN NACL 20-0.9 MG/50ML-% IV SOLN
20.0000 mg | Freq: Once | INTRAVENOUS | Status: AC
  Administered 2013-03-08: 20 mg via INTRAVENOUS
  Filled 2013-03-08: qty 50

## 2013-03-08 MED ORDER — METHYLPREDNISOLONE SODIUM SUCC 125 MG IJ SOLR
125.0000 mg | Freq: Once | INTRAMUSCULAR | Status: AC
  Administered 2013-03-08: 125 mg via INTRAVENOUS
  Filled 2013-03-08: qty 2

## 2013-03-08 MED ORDER — ALBUTEROL SULFATE (5 MG/ML) 0.5% IN NEBU
5.0000 mg | INHALATION_SOLUTION | RESPIRATORY_TRACT | Status: DC
  Administered 2013-03-08 (×2): 5 mg via RESPIRATORY_TRACT
  Filled 2013-03-08: qty 1
  Filled 2013-03-08: qty 0.5
  Filled 2013-03-08: qty 1
  Filled 2013-03-08: qty 0.5

## 2013-03-08 MED ORDER — ALBUTEROL SULFATE (5 MG/ML) 0.5% IN NEBU
2.5000 mg | INHALATION_SOLUTION | RESPIRATORY_TRACT | Status: DC | PRN
  Administered 2013-03-09: 2.5 mg via RESPIRATORY_TRACT
  Filled 2013-03-08: qty 0.5

## 2013-03-08 MED ORDER — EPINEPHRINE 0.3 MG/0.3ML IJ SOAJ
0.3000 mg | Freq: Once | INTRAMUSCULAR | Status: AC
  Administered 2013-03-08: 0.3 mg via INTRAMUSCULAR
  Filled 2013-03-08: qty 0.3

## 2013-03-08 MED ORDER — IPRATROPIUM BROMIDE 0.02 % IN SOLN
0.5000 mg | RESPIRATORY_TRACT | Status: DC
  Administered 2013-03-08 (×2): 0.5 mg via RESPIRATORY_TRACT
  Filled 2013-03-08 (×3): qty 2.5

## 2013-03-08 MED ORDER — IPRATROPIUM BROMIDE 0.02 % IN SOLN
0.5000 mg | RESPIRATORY_TRACT | Status: DC | PRN
  Administered 2013-03-09: 0.5 mg via RESPIRATORY_TRACT
  Filled 2013-03-08: qty 2.5

## 2013-03-08 MED ORDER — ACETAMINOPHEN 325 MG PO TABS: 650.0000 mg | ORAL_TABLET | Freq: Four times a day (QID) | ORAL | Status: DC | PRN

## 2013-03-08 MED ORDER — ONDANSETRON HCL 4 MG/2ML IJ SOLN: 4.0000 mg | Freq: Four times a day (QID) | INTRAMUSCULAR | Status: DC | PRN

## 2013-03-08 MED ORDER — ENOXAPARIN SODIUM 40 MG/0.4ML ~~LOC~~ SOLN
40.0000 mg | SUBCUTANEOUS | Status: DC
  Administered 2013-03-08 – 2013-03-09 (×2): 40 mg via SUBCUTANEOUS
  Filled 2013-03-08 (×2): qty 0.4

## 2013-03-08 MED ORDER — METHYLPREDNISOLONE SODIUM SUCC 40 MG IJ SOLR
40.0000 mg | Freq: Two times a day (BID) | INTRAMUSCULAR | Status: DC
  Administered 2013-03-08 – 2013-03-09 (×2): 40 mg via INTRAVENOUS
  Filled 2013-03-08 (×4): qty 1

## 2013-03-08 MED ORDER — ONDANSETRON HCL 4 MG PO TABS: 4.0000 mg | ORAL_TABLET | Freq: Four times a day (QID) | ORAL | Status: DC | PRN

## 2013-03-08 MED ORDER — ALBUTEROL SULFATE (5 MG/ML) 0.5% IN NEBU: 5.0000 mg | INHALATION_SOLUTION | RESPIRATORY_TRACT | Status: DC | PRN

## 2013-03-08 MED ORDER — FAMOTIDINE IN NACL 20-0.9 MG/50ML-% IV SOLN
20.0000 mg | Freq: Two times a day (BID) | INTRAVENOUS | Status: DC
  Administered 2013-03-08 – 2013-03-09 (×2): 20 mg via INTRAVENOUS
  Filled 2013-03-08 (×4): qty 50

## 2013-03-08 MED ORDER — ONDANSETRON HCL 4 MG/2ML IJ SOLN: 4.0000 mg | Freq: Three times a day (TID) | INTRAMUSCULAR | Status: AC | PRN

## 2013-03-08 MED ORDER — WHITE PETROLATUM GEL
Status: AC
  Administered 2013-03-08: 0.2
  Filled 2013-03-08: qty 5

## 2013-03-08 NOTE — H&P (Signed)
[facility] History and Physical  Alexis Beck JWJ:[id number] DOB: [birth year] DOA: 03/08/2013  Referring physician: PCP: No primary provider on file.   Chief Complaint: Shortness of breath   HPI:  37 year old female who presents to the ER with a chief complaint of swelling, numbness and tingling in her lips, wheezing worse over the last couple of days. As well as an allergic reaction to an unknown trigger. The patient denies any recent history of travel denies any recent exposure to seafood. Denies any recent exposure to over-the-counter medications. She received IV Solu-Medrol, Pepcid, Benadryl and the ED.She has some swelling in her face yesterday and woke up with worsening swelling of her face, mild shortness of breath and wheezing. She also noted her voice is different, and she cannot open her mouth as much. She denies any chest pain, rash, stridor, difficulty in breathing, abdominal pain, abdominal cramping, nausea, vomiting, diarrhea, lower extremity swelling.       Review of Systems: negative for the following  Constitutional: Denies fever, chills, diaphoresis, appetite change and fatigue.  HEENT: Denies photophobia, eye pain, redness, hearing loss, ear pain, congestion, sore throat, rhinorrhea, sneezing, mouth sores, trouble swallowing, neck pain, neck stiffness and tinnitus.  Respiratory: Positive for SOB, DOE, cough, chest tightness, and wheezing.  Cardiovascular: Denies chest pain, palpitations and leg swelling.  Gastrointestinal: Denies nausea, vomiting, abdominal pain, diarrhea, constipation, blood in stool and abdominal distention.  Genitourinary: Denies dysuria, urgency, frequency, hematuria, flank pain and difficulty urinating.  Musculoskeletal: Denies myalgias, back pain, joint swelling, arthralgias and gait problem.  Skin: Denies pallor, rash and wound.  Neurological: Denies dizziness, seizures, syncope, weakness, light-headedness, numbness and headaches.   Hematological: Denies adenopathy. Easy bruising, personal or family bleeding history  Psychiatric/Behavioral: Denies suicidal ideation, mood changes, confusion, nervousness, sleep disturbance and agitation       Past Medical History  Diagnosis Date  . Asthma   . Anxiety   . Bipolar disorder   . Depression      Past Surgical History  Procedure Laterality Date  . Tubal ligation  2003      Social History:  reports that she has quit smoking. She does not have any smokeless tobacco history on file. She reports that she does not drink alcohol or use illicit drugs.   Allergies  Allergen Reactions  . Shellfish Allergy Anaphylaxis and Hives    Family History  Problem Relation Age of Onset  . Drug abuse Mother   . Drug abuse Father   . Depression Brother   . Depression Maternal Grandmother   . Schizophrenia Cousin      Prior to Admission medications   Medication Sig Start Date End Date Taking? Authorizing Provider  albuterol (PROVENTIL HFA;VENTOLIN HFA) 108 (90 BASE) MCG/ACT inhaler Inhale 2 puffs into the lungs every 6 (six) hours as needed for wheezing or shortness of breath.   Yes Historical Provider, MD  predniSONE (DELTASONE) 20 MG tablet Take 2 tablets (40 mg total) by mouth daily. 03/06/13   Roxy Horseman, PA-C     Physical Exam: Filed Vitals:   03/08/13 0506 03/08/13 0610 03/08/13 0715 03/08/13 0817  BP: 127/90 126/72 122/76 133/75  Pulse:  85 83 78  Temp: 98.6 F (37 C)   98.7 F (37.1 C)  TempSrc: Oral   Oral  Resp: 18   18  Height:    5\' 5"  (1.651 m)  Weight:    90.6 kg (199 lb 11.8 oz)  SpO2: 100% 100% 99% 99%  Constitutional: Vital signs reviewed. Patient is a well-developed and well-nourished in no acute distress and cooperative with exam. Alert and oriented x3.  Head: Normocephalic and atraumatic  Ear: TM normal bilaterally  Mouth: no erythema or exudates, MMM  Eyes: PERRL, EOMI, conjunctivae normal, No scleral icterus.  Neck: Supple,  Trachea midline normal ROM, No JVD, mass, thyromegaly, or carotid bruit present.  Cardiovascular: RRR, S1 normal, S2 normal, no MRG, pulses symmetric and intact bilaterally  Pulmonary/Chest: CTAB, no wheezes, rales, or rhonchi  Abdominal: Soft. Non-tender, non-distended, bowel sounds are normal, no masses, organomegaly, or guarding present.  GU: no CVA tenderness Musculoskeletal: No joint deformities, erythema, or stiffness, ROM full and no nontender Ext: no edema and no cyanosis, pulses palpable bilaterally (DP and PT)  Hematology: no cervical, inginal, or axillary adenopathy.  Neurological: A&O x3, Strenght is normal and symmetric bilaterally, cranial nerve II-XII are grossly intact, no focal motor deficit, sensory intact to light touch bilaterally.  Skin: Warm, dry and intact. No rash, cyanosis, or clubbing.  Psychiatric: Normal mood and affect. speech and behavior is normal. Judgment and thought content normal. Cognition and memory are normal.       Labs on Admission:    Basic Metabolic Panel:  Recent Labs Lab 03/08/13 0516 03/08/13 0744  NA 140  --   K 4.4  --   CL 104  --   CO2 25  --   GLUCOSE 130*  --   BUN 13  --   CREATININE 0.97 0.91  CALCIUM 9.0  --    Liver Function Tests: No results found for this basename: AST, ALT, ALKPHOS, BILITOT, PROT, ALBUMIN,  in the last 168 hours No results found for this basename: LIPASE, AMYLASE,  in the last 168 hours No results found for this basename: AMMONIA,  in the last 168 hours CBC:  Recent Labs Lab 03/08/13 0516 03/08/13 0744  WBC 8.3 6.4  NEUTROABS 3.9  --   HGB 11.6* 11.1*  HCT 36.0 34.8*  MCV 75.6* 75.8*  PLT 225 186   Cardiac Enzymes:  Recent Labs Lab 03/08/13 0744  TROPONINI <0.30    BNP (last 3 results) No results found for this basename: PROBNP,  in the last 8760 hours    CBG: No results found for this basename: GLUCAP,  in the last 168 hours  Radiological Exams on Admission: No results  found.  EKG: Independently reviewed.    Assessment/Plan Principal Problem:   Anaphylaxis Active Problems:   Asthma   Anaphylaxis Continue Solu-Medrol, Benadryl, Pepcid Oral complement studies C3-C4   Asthma Continue scheduled nebulizer treatments and steroids    Code Status:   full Family Communication: bedside Disposition Plan: admit observation anticipate discharge tomorrow  Time spent: 70 mins   [facility] Pager [phone]  If 7PM-7AM, please contact night-coverage [email or web address] Password [facility] 03/08/2013, 10:16 AM

## 2013-03-08 NOTE — ED Provider Notes (Signed)
CSN: [id number]     Arrival date & time 03/08/13  0449 History     First MD Initiated Contact with Patient 03/08/13 0501     Chief Complaint  Patient presents with  . Allergic Reaction   HPI Alexis Beck is a 37 y.o. female with a history of allergic reactions to food, she is a known shellfish allergy, she says his skin test came back positive for she can indeed consistently don't affect her.  Patient was seen on Thursday and treated for mild asthma exacerbation with prednisone. She has some swelling in her face yesterday and woke up with worsening swelling of her face, mild shortness of breath and wheezing. She also noted her voice is different, and she cannot open her mouth as much.  She denies any chest pain, rash, stridor, difficulty in breathing, abdominal pain, abdominal cramping, nausea, vomiting, diarrhea, lower extremity swelling.   Past Medical History  Diagnosis Date  . Asthma   . Anxiety   . Bipolar disorder   . Depression    Past Surgical History  Procedure Laterality Date  . Tubal ligation  2003   Family History  Problem Relation Age of Onset  . Drug abuse Mother   . Drug abuse Father   . Depression Brother   . Depression Maternal Grandmother   . Schizophrenia Cousin    History  Substance Use Topics  . Smoking status: Former Games developer  . Smokeless tobacco: Not on file  . Alcohol Use: No   OB History   Grav Para Term Preterm Abortions TAB SAB Ect Mult Living                 Review of Systems At least 10pt or greater review of systems completed and are negative except where specified in the HPI.  Allergies  Shellfish allergy  Home Medications   Current Outpatient Rx  Name  Route  Sig  Dispense  Refill  . albuterol (PROVENTIL HFA;VENTOLIN HFA) 108 (90 BASE) MCG/ACT inhaler   Inhalation   Inhale 2 puffs into the lungs every 6 (six) hours as needed for wheezing or shortness of breath.         . predniSONE (DELTASONE) 20 MG tablet   Oral   Take 2  tablets (40 mg total) by mouth daily.   10 tablet   0    BP 127/90  Temp(Src) 98.6 F (37 C) (Oral)  Resp 18  SpO2 100%  LMP 02/07/2013 Physical Exam  Nursing notes reviewed.  Electronic medical record reviewed. VITAL SIGNS:   Filed Vitals:   03/08/13 0506  BP: 127/90  Temp: 98.6 F (37 C)  TempSrc: Oral  Resp: 18  SpO2: 100%   CONSTITUTIONAL: Awake, oriented, appears non-toxic HENT: Atraumatic, normocephalic, generalized swelling to the face.  Oral mucosa pink and moist, airway patent - voice slightly muffled.  Patient cannot open mouth very far, she says this is new. Nares patent without drainage. External ears normal. EYES: Conjunctiva injected, EOMI, PERRLA NECK: Trachea midline, non-tender, supple CARDIOVASCULAR: Normal heart rate, Normal rhythm, No murmurs, rubs, gallops PULMONARY/CHEST: Poor air movement bilaterally, wheezing Symmetrical breath sounds. Non-tender. ABDOMINAL: Non-distended, soft, non-tender - no rebound or guarding.  BS normal. NEUROLOGIC: Non-focal, moving all four extremities, no gross sensory or motor deficits. EXTREMITIES: No clubbing, cyanosis, or edema SKIN: Warm, Dry, No erythema, No rash  ED Course   Procedures (including critical care time)  Labs Reviewed  CBC WITH DIFFERENTIAL - Abnormal; Notable for the following:  Hemoglobin 11.6 (*)    MCV 75.6 (*)    MCH 24.4 (*)    RDW 16.5 (*)    All other components within normal limits  BASIC METABOLIC PANEL - Abnormal; Notable for the following:    Glucose, Bld 130 (*)    GFR calc non Af Amer 73 (*)    GFR calc Af Amer 85 (*)    All other components within normal limits   No results found. 1. Allergic reaction, initial encounter   2. Mouth swelling   3. Wheezing   4. Dyspnea     MDM  Alexis Beck is a 37 y.o. female presenting with allergic reaction concerning for early anaphylaxis, patient was not hypotensive, however patient's had some airway involvement, change in her voice,  wheezing and poor air movement-patient was treated with Pepcid, Benadryl and EpiPen which reversed her symptoms. Patient was also given steroids. Will admit patient given clinical picture and airway involvement.  Patient feels much better at this time, minimal wheezing, good air movement bilaterally, voice sounds normal. Admit patient for observation- admitted stable discussed with [facility]   Jones Skene, MD 03/08/13 [phone]

## 2013-03-08 NOTE — Progress Notes (Signed)
Pt's heart increased to the 140's ST when ambulating to bathroom. Pt asymptomatic. MD notified. Will continue to monitor.

## 2013-03-08 NOTE — Progress Notes (Signed)
Admission note:  Arrival Method: from ED Mental Orientation: A&Ox4 Telemetry: Yes, tele applied box #11 Assessment: see charting Skin: Intact, edema lips IV: RAC NS@75  Pain: N/A Tubes: N/A Safety Measures: Patient Handbook has been given. Left at bedside. Admission: Completed and admission orders have been written  6700 Orientation: Patient has been oriented to the unit, staff and to the room.  Family: None present

## 2013-03-08 NOTE — ED Notes (Signed)
Pt's face and lips are less swollen. Pt is able to talk without mumbling, and she states that she feels better.

## 2013-03-08 NOTE — ED Notes (Signed)
Pt states that was seen on Thursday and given Prednisone. Pt started to notice swelling in her face yesterday, and woke up this AM swelling, slight SOB, and wheezing. Pt's lung sounds are diminished, and states that she is having SOB.

## 2013-03-09 LAB — CBC
Hemoglobin: 11 g/dL — ABNORMAL LOW (ref 12.0–15.0)
Platelets: 243 10*3/uL (ref 150–400)
WBC: 8.9 10*3/uL (ref 4.0–10.5)

## 2013-03-09 LAB — COMPREHENSIVE METABOLIC PANEL
ALT: 11 U/L (ref 0–35)
BUN: 10 mg/dL (ref 6–23)
CO2: 24 mEq/L (ref 19–32)
Calcium: 9.3 mg/dL (ref 8.4–10.5)
GFR calc Af Amer: >90 mL/min (ref >90)
GFR calc non Af Amer: 86 mL/min — ABNORMAL LOW (ref >90)
Glucose, Bld: 160 mg/dL — ABNORMAL HIGH (ref 70–99)
Sodium: 137 mEq/L (ref 135–145)

## 2013-03-09 MED ORDER — FAMOTIDINE 40 MG PO TABS: 20.0000 mg | ORAL_TABLET | Freq: Two times a day (BID) | ORAL | Status: DC

## 2013-03-09 MED ORDER — PREDNISONE 20 MG PO TABS: 40.0000 mg | ORAL_TABLET | Freq: Every day | ORAL | Status: AC

## 2013-03-09 MED ORDER — EPINEPHRINE 0.15 MG/0.3ML IJ SOAJ: 0.1500 mg | INTRAMUSCULAR | Status: DC | PRN

## 2013-03-09 NOTE — Progress Notes (Signed)
Discussed discharge instructions and medications with pt. Pt showed no barriers to discharge. IV removed. Tele removed. Pt discharged to home with husband. Assessment unchanged from morning.   On a side note: Pt's TSH is elevated, this is new for the pt. MD Abrol notified. Per MD, instructed pt to follow-up with new PCP in regards to this (may need further diagnostics).

## 2013-03-09 NOTE — Progress Notes (Signed)
03/09/2013 1220 NCM spoke to pt and provided contact info for [facility] and [employer]. Explained to call Monday to arrange appt. States she lost her job and currently does not have insurance or financial resources to pay COBRA. She has small child in the home and plans to follow up with applying for Medicaid. Resources information added to dc instruction to follow up with DSS to apply for Medicaid. Isidoro Donning RN CCM Case Mgmt phone [phone]

## 2013-03-09 NOTE — Discharge Summary (Signed)
Physician Discharge Summary  Alexis Beck MRN: [id number] DOB/AGE: Jul 19, 1976 37 y.o.  PCP: No primary provider on file.   Admit date: 03/08/2013 Discharge date: 03/09/2013  Discharge Diagnoses:      Anaphylaxis Active Problems:   Asthma Microcytic anemia Tachycardia likely secondary to anemia    Medication List         albuterol 108 (90 BASE) MCG/ACT inhaler  Commonly known as:  PROVENTIL HFA;VENTOLIN HFA  Inhale 2 puffs into the lungs every 6 (six) hours as needed for wheezing or shortness of breath.     famotidine 40 MG tablet  Commonly known as:  PEPCID  Take 0.5 tablets (20 mg total) by mouth 2 (two) times daily.     predniSONE 20 MG tablet  Commonly known as:  DELTASONE  Take 2 tablets (40 mg total) by mouth daily.        Discharge Condition: Stable Disposition: 01-Home or Self Care   Consults:    Significant Diagnostic Studies: No results found.     Microbiology: No results found for this or any previous visit (from the past 240 hour(s)).   Labs: Results for orders placed during the hospital encounter of 03/08/13 (from the past 48 hour(s))  CBC WITH DIFFERENTIAL     Status: Abnormal   Collection Time    03/08/13  5:16 AM      Result Value Range   WBC 8.3  4.0 - 10.5 K/uL   RBC 4.76  3.87 - 5.11 MIL/uL   Hemoglobin 11.6 (*) 12.0 - 15.0 g/dL   HCT 40.9  81.1 - 91.4 %   MCV 75.6 (*) 78.0 - 100.0 fL   MCH 24.4 (*) 26.0 - 34.0 pg   MCHC 32.2  30.0 - 36.0 g/dL   RDW 78.2 (*) 95.6 - 21.3 %   Platelets 225  150 - 400 K/uL   Neutrophils Relative % 47  43 - 77 %   Neutro Abs 3.9  1.7 - 7.7 K/uL   Lymphocytes Relative 43  12 - 46 %   Lymphs Abs 3.6  0.7 - 4.0 K/uL   Monocytes Relative 6  3 - 12 %   Monocytes Absolute 0.5  0.1 - 1.0 K/uL   Eosinophils Relative 3  0 - 5 %   Eosinophils Absolute 0.2  0.0 - 0.7 K/uL   Basophils Relative 0  0 - 1 %   Basophils Absolute 0.0  0.0 - 0.1 K/uL  BASIC METABOLIC PANEL     Status: Abnormal   Collection  Time    03/08/13  5:16 AM      Result Value Range   Sodium 140  135 - 145 mEq/L   Potassium 4.4  3.5 - 5.1 mEq/L   Chloride 104  96 - 112 mEq/L   CO2 25  19 - 32 mEq/L   Glucose, Bld 130 (*) 70 - 99 mg/dL   BUN 13  6 - 23 mg/dL   Creatinine, Ser 0.86  0.50 - 1.10 mg/dL   Calcium 9.0  8.4 - 57.8 mg/dL   GFR calc non Af Amer 73 (*) >90 mL/min   GFR calc Af Amer 85 (*) >90 mL/min   Comment:            The eGFR has been calculated     using the CKD EPI equation.     This calculation has not been     validated in all clinical     situations.  eGFR's persistently     <90 mL/min signify     possible Chronic Kidney Disease.  CBC     Status: Abnormal   Collection Time    03/08/13  7:44 AM      Result Value Range   WBC 6.4  4.0 - 10.5 K/uL   RBC 4.59  3.87 - 5.11 MIL/uL   Hemoglobin 11.1 (*) 12.0 - 15.0 g/dL   HCT 16.1 (*) 09.6 - 04.5 %   MCV 75.8 (*) 78.0 - 100.0 fL   MCH 24.2 (*) 26.0 - 34.0 pg   MCHC 31.9  30.0 - 36.0 g/dL   RDW 40.9 (*) 81.1 - 91.4 %   Platelets 186  150 - 400 K/uL  CREATININE, SERUM     Status: Abnormal   Collection Time    03/08/13  7:44 AM      Result Value Range   Creatinine, Ser 0.91  0.50 - 1.10 mg/dL   GFR calc non Af Amer 79 (*) >90 mL/min   GFR calc Af Amer >90  >90 mL/min   Comment:            The eGFR has been calculated     using the CKD EPI equation.     This calculation has not been     validated in all clinical     situations.     eGFR's persistently     <90 mL/min signify     possible Chronic Kidney Disease.  TROPONIN I     Status: None   Collection Time    03/08/13  7:44 AM      Result Value Range   Troponin I <0.30  <0.30 ng/mL   Comment:            Due to the release kinetics of cTnI,     a negative result within the first hours     of the onset of symptoms does not rule out     myocardial infarction with certainty.     If myocardial infarction is still suspected,     repeat the test at appropriate intervals.  TSH      Status: Abnormal   Collection Time    03/08/13  7:44 AM      Result Value Range   TSH 8.484 (*) 0.350 - 4.500 uIU/mL  TROPONIN I     Status: None   Collection Time    03/08/13  2:47 PM      Result Value Range   Troponin I <0.30  <0.30 ng/mL   Comment:            Due to the release kinetics of cTnI,     a negative result within the first hours     of the onset of symptoms does not rule out     myocardial infarction with certainty.     If myocardial infarction is still suspected,     repeat the test at appropriate intervals.  TROPONIN I     Status: None   Collection Time    03/08/13  6:32 PM      Result Value Range   Troponin I <0.30  <0.30 ng/mL   Comment:            Due to the release kinetics of cTnI,     a negative result within the first hours     of the onset of symptoms does not rule out     myocardial infarction with certainty.  If myocardial infarction is still suspected,     repeat the test at appropriate intervals.  COMPREHENSIVE METABOLIC PANEL     Status: Abnormal   Collection Time    03/09/13  4:45 AM      Result Value Range   Sodium 137  135 - 145 mEq/L   Potassium 4.4  3.5 - 5.1 mEq/L   Chloride 104  96 - 112 mEq/L   CO2 24  19 - 32 mEq/L   Glucose, Bld 160 (*) 70 - 99 mg/dL   BUN 10  6 - 23 mg/dL   Creatinine, Ser 1.61  0.50 - 1.10 mg/dL   Calcium 9.3  8.4 - 09.6 mg/dL   Total Protein 6.9  6.0 - 8.3 g/dL   Albumin 3.5  3.5 - 5.2 g/dL   AST 15  0 - 37 U/L   ALT 11  0 - 35 U/L   Alkaline Phosphatase 48  39 - 117 U/L   Total Bilirubin 0.4  0.3 - 1.2 mg/dL   GFR calc non Af Amer 86 (*) >90 mL/min   GFR calc Af Amer >90  >90 mL/min   Comment:            The eGFR has been calculated     using the CKD EPI equation.     This calculation has not been     validated in all clinical     situations.     eGFR's persistently     <90 mL/min signify     possible Chronic Kidney Disease.  CBC     Status: Abnormal   Collection Time    03/09/13  4:45 AM       Result Value Range   WBC 8.9  4.0 - 10.5 K/uL   RBC 4.59  3.87 - 5.11 MIL/uL   Hemoglobin 11.0 (*) 12.0 - 15.0 g/dL   HCT 04.5 (*) 40.9 - 81.1 %   MCV 75.6 (*) 78.0 - 100.0 fL   MCH 24.0 (*) 26.0 - 34.0 pg   MCHC 31.7  30.0 - 36.0 g/dL   RDW 91.4 (*) 78.2 - 95.6 %   Platelets 243  150 - 400 K/uL   Comment: DELTA CHECK NOTED     REPEATED TO VERIFY     HPI : 37 year old female who presents to the ER with a chief complaint of swelling, numbness and tingling in her lips, wheezing worse over the last couple of days. As well as an allergic reaction to an unknown trigger. The patient denies any recent history of travel denies any recent exposure to seafood. Denies any recent exposure to over-the-counter medications. She was found to have wheezing and poor air movement-patient was treated with Pepcid, Benadryl and EpiPen which reversed her symptoms. Patient was also given steroids. She has some swelling in her face yesterday and woke up with worsening swelling of her face, mild shortness of breath and wheezing. She also noted her voice is different, and she cannot open her mouth as much. She denies any chest pain, rash, stridor, difficulty in breathing, abdominal pain, abdominal cramping, nausea, vomiting, diarrhea, lower extremity swelling.  HOSPITAL COURSE:   Anaphylaxis Symptoms improved Continue with by mouth prednisone and Pepcid for another 3-4 days. Patient advised to try over-the-counter Benadryl for symptoms flare up again Complement studies C3 and C4 were drawn results pending at this time  Asthma Patient continue with oral prednisone and inhalers, but she is without exacerbation  Anemia Microcytic anemia Patient complains  of heavy menstrual flow She has lost her insurance She has been advised to followup with [facility] Followup CBC in one week Advice to start over-the-counter iron tablets and multivitamin   Tachycardia likely secondary to her anemia, TSH  pending  Discharge Exam:  Blood pressure 130/63, pulse 83, temperature 98.8 F (37.1 C), temperature source Oral, resp. rate 18, height 5\' 5"  (1.651 m), weight 89.8 kg (197 lb 15.6 oz), last menstrual period 02/07/2013, SpO2 98.00%.  Neck: Supple, Trachea midline normal ROM, No JVD, mass, thyromegaly, or carotid bruit present.  Cardiovascular: RRR, S1 normal, S2 normal, no MRG, pulses symmetric and intact bilaterally  Pulmonary/Chest: CTAB, no wheezes, rales, or rhonchi  Abdominal: Soft. Non-tender, non-distended, bowel sounds are normal, no masses, organomegaly, or guarding present.  GU: no CVA tenderness Musculoskeletal: No joint deformities, erythema, or stiffness, ROM full and no nontender Ext: no edema and no cyanosis, pulses palpable bilaterally (DP and PT)  Hematology: no cervical, inginal, or axillary adenopathy.  Neurological: A&O x3, Strenght is normal and symmetric bilaterally, cranial nerve II-XII are grossly intact, no focal motor deficit, sensory intact to light touch bilaterally.  Skin: Warm, dry and intact. No rash, cyanosis, or clubbing.  Psychiatric: Normal mood and affect. speech and behavior is normal. Judgment and thought content normal. Cognition and memory are normal.         Signed: Alejandro Gamel 03/09/2013, 8:05 AM

## 2013-03-20 ENCOUNTER — Ambulatory Visit: Payer: Self-pay

## 2014-04-20 ENCOUNTER — Encounter (HOSPITAL_COMMUNITY): Payer: Self-pay | Admitting: Emergency Medicine

## 2014-04-20 ENCOUNTER — Emergency Department (HOSPITAL_COMMUNITY)
Admission: EM | Admit: 2014-04-20 | Discharge: 2014-04-20 | Disposition: A | Discharge status: Home / Self Care | Payer: 59 | Attending: Emergency Medicine | Admitting: Emergency Medicine

## 2014-04-20 DIAGNOSIS — Z79899 Other long term (current) drug therapy: Secondary | ICD-10-CM | POA: Insufficient documentation

## 2014-04-20 DIAGNOSIS — F410 Panic disorder [episodic paroxysmal anxiety] without agoraphobia: Secondary | ICD-10-CM | POA: Insufficient documentation

## 2014-04-20 DIAGNOSIS — Z87891 Personal history of nicotine dependence: Secondary | ICD-10-CM | POA: Insufficient documentation

## 2014-04-20 DIAGNOSIS — F419 Anxiety disorder, unspecified: Secondary | ICD-10-CM

## 2014-04-20 DIAGNOSIS — R259 Unspecified abnormal involuntary movements: Secondary | ICD-10-CM | POA: Insufficient documentation

## 2014-04-20 DIAGNOSIS — F411 Generalized anxiety disorder: Secondary | ICD-10-CM | POA: Insufficient documentation

## 2014-04-20 DIAGNOSIS — J45909 Unspecified asthma, uncomplicated: Secondary | ICD-10-CM | POA: Insufficient documentation

## 2014-04-20 DIAGNOSIS — R002 Palpitations: Secondary | ICD-10-CM | POA: Insufficient documentation

## 2014-04-20 MED ORDER — ALPRAZOLAM 0.5 MG PO TABS
0.5000 mg | ORAL_TABLET | Freq: Once | ORAL | Status: AC
  Administered 2014-04-20: 0.5 mg via ORAL
  Filled 2014-04-20: qty 1

## 2014-04-20 NOTE — ED Notes (Signed)
Per pt, states a history of anxiety-off meds-had anxiety attack this am

## 2014-04-20 NOTE — Progress Notes (Signed)
[place],   Provided pt with a [employer], highlighting [facility] [place], to help patient establish primary care.

## 2014-04-20 NOTE — ED Provider Notes (Signed)
CSN: [id number]     Arrival date & time 04/20/14  1017 History   First MD Initiated Contact with Patient 04/20/14 1041     Chief Complaint  Patient presents with  . Anxiety   Patient is a 38 y.o. female presenting with anxiety.  Anxiety Pertinent negatives include no chest pain.    Patient is a 38 y.o. Female with a history of anxiety, bipolar 2, OCD, and possible borderline personality presents to the ED with panic attack.  Patient states that she woke up this morning feeling extremely anxious and had tremors of her hands, crying, rapid heart beat, and feelings of anxiety.  Patient states that she feels a little better now, but she is still really anxious.  Patient states that she used to be on medicines, but has not been on them for the past two years.  She states that her mother recently died and now she has developed anxiety again and does not have a doctor and is looking for some resources.    Past Medical History  Diagnosis Date  . Asthma   . Anxiety   . Bipolar disorder   . Depression    Past Surgical History  Procedure Laterality Date  . Tubal ligation  2003   Family History  Problem Relation Age of Onset  . Drug abuse Mother   . Drug abuse Father   . Depression Brother   . Depression Maternal Grandmother   . Schizophrenia Cousin    History  Substance Use Topics  . Smoking status: Former Research scientist (life sciences)  . Smokeless tobacco: Not on file  . Alcohol Use: No   OB History   Grav Para Term Preterm Abortions TAB SAB Ect Mult Living                 Review of Systems  Respiratory: Negative for chest tightness and shortness of breath.   Cardiovascular: Positive for palpitations. Negative for chest pain.  Neurological: Positive for tremors.  Psychiatric/Behavioral: Positive for decreased concentration. Negative for suicidal ideas and sleep disturbance. The patient is nervous/anxious.   All other systems reviewed and are negative.     Allergies  Shellfish allergy  Home  Medications   Prior to Admission medications   Medication Sig Start Date End Date Taking? Authorizing Provider  albuterol (PROVENTIL HFA;VENTOLIN HFA) 108 (90 BASE) MCG/ACT inhaler Inhale 2 puffs into the lungs every 6 (six) hours as needed for wheezing or shortness of breath.   Yes Historical Provider, MD  EPINEPHrine (EPIPEN JR) 0.15 MG/0.3ML injection Inject 0.15 mg into the muscle as needed for anaphylaxis.   Yes Historical Provider, MD   BP 132/78  Pulse 84  Temp(Src) 98.5 F (36.9 C) (Oral)  Resp 16  SpO2 99%  LMP 04/03/2014 Physical Exam  Nursing note and vitals reviewed. Constitutional: She is oriented to person, place, and time. She appears well-developed and well-nourished. No distress.  HENT:  Head: Normocephalic and atraumatic.  Mouth/Throat: Oropharynx is clear and moist. No oropharyngeal exudate.  Eyes: Conjunctivae and EOM are normal. Pupils are equal, round, and reactive to light. No scleral icterus.  Neck: Normal range of motion. Neck supple. No JVD present. No thyromegaly present.  Cardiovascular: Normal rate, regular rhythm, normal heart sounds and intact distal pulses.  Exam reveals no gallop and no friction rub.   No murmur heard. Pulmonary/Chest: Effort normal and breath sounds normal. No respiratory distress. She has no wheezes. She has no rales. She exhibits no tenderness.  Lymphadenopathy:  She has no cervical adenopathy.  Neurological: She is alert and oriented to person, place, and time.  Skin: Skin is warm and dry. She is not diaphoretic.  Psychiatric: Her speech is normal. Judgment normal. Her mood appears anxious. She is withdrawn. Cognition and memory are normal. She expresses no homicidal and no suicidal ideation. She expresses no suicidal plans and no homicidal plans.    ED Course  Procedures (including critical care time) Labs Review Labs Reviewed - No data to display  Imaging Review No results found.   EKG Interpretation None      MDM    Final diagnoses:  Anxiety  Panic attacks   Patient is a 38 y.o. Female who presents to the ED with anxiety.  Patient had unremarkable physical examination here.  Patient treated with xanax as she has a ride.  Patient to be given referral to [facility] and was told to go there after leaving here.  Will also give a resource list at this time.  Patient denies homicidal or suicidal ideations.  Patient to return to the ED for suicidal or homicidal ideations.  Patient is stable for discharge at this time.      Cherylann Parr, PA-C 04/20/14 1106

## 2014-04-20 NOTE — Discharge Instructions (Signed)
Panic Attacks Panic attacks are sudden, short-livedsurges of severe anxiety, fear, or discomfort. They may occur for no reason when you are relaxed, when you are anxious, or when you are sleeping. Panic attacks may occur for a number of reasons:   Healthy people occasionally have panic attacks in extreme, life-threatening situations, such as war or natural disasters. Normal anxiety is a protective mechanism of the body that helps [place] react to danger (fight or flight response).  Panic attacks are often seen with anxiety disorders, such as panic disorder, social anxiety disorder, generalized anxiety disorder, and phobias. Anxiety disorders cause excessive or uncontrollable anxiety. They may interfere with your relationships or other life activities.  Panic attacks are sometimes seen with other mental illnesses, such as depression and posttraumatic stress disorder.  Certain medical conditions, prescription medicines, and drugs of abuse can cause panic attacks. SYMPTOMS  Panic attacks start suddenly, peak within 20 minutes, and are accompanied by four or more of the following symptoms:  Pounding heart or fast heart rate (palpitations).  Sweating.  Trembling or shaking.  Shortness of breath or feeling smothered.  Feeling choked.  Chest pain or discomfort.  Nausea or strange feeling in your stomach.  Dizziness, light-headedness, or feeling like you will faint.  Chills or hot flushes.  Numbness or tingling in your lips or hands and feet.  Feeling that things are not real or feeling that you are not yourself.  Fear of losing control or going crazy.  Fear of dying. Some of these symptoms can mimic serious medical conditions. For example, you may think you are having a heart attack. Although panic attacks can be very scary, they are not life threatening. DIAGNOSIS  Panic attacks are diagnosed through an assessment by your health care provider. Your health care provider will ask  questions about your symptoms, such as where and when they occurred. Your health care provider will also ask about your medical history and use of alcohol and drugs, including prescription medicines. Your health care provider may order blood tests or other studies to rule out a serious medical condition. Your health care provider may refer you to a mental health professional for further evaluation. TREATMENT   Most healthy people who have one or two panic attacks in an extreme, life-threatening situation will not require treatment.  The treatment for panic attacks associated with anxiety disorders or other mental illness typically involves counseling with a mental health professional, medicine, or a combination of both. Your health care provider will help determine what treatment is best for you.  Panic attacks due to physical illness usually go away with treatment of the illness. If prescription medicine is causing panic attacks, talk with your health care provider about stopping the medicine, decreasing the dose, or substituting another medicine.  Panic attacks due to alcohol or drug abuse go away with abstinence. Some adults need professional help in order to stop drinking or using drugs. HOME CARE INSTRUCTIONS   Take all medicines as directed by your health care provider.   Schedule and attend follow-up visits as directed by your health care provider. It is important to keep all your appointments. SEEK MEDICAL CARE IF:  You are not able to take your medicines as prescribed.  Your symptoms do not improve or get worse. SEEK IMMEDIATE MEDICAL CARE IF:   You experience panic attack symptoms that are different than your usual symptoms.  You have serious thoughts about hurting yourself or others.  You are taking medicine for panic attacks and  have a serious side effect. °MAKE SURE YOU: °· Understand these instructions. °· Will watch your condition. °· Will get help right away if you are not  doing well or get worse. °Document Released: 07/24/2005 Document Revised: 07/29/2013 Document Reviewed: 03/07/2013 °ExitCare® Patient Information ©2015 [employer]. This information is not intended to replace advice given to you by your health care provider. Make sure you discuss any questions you have with your health care provider. ° °Emergency Department Resource Guide °1) Find a Doctor and Pay Out of Pocket °Although you won't have to find out who is covered by your insurance plan, it is a good idea to ask around and get recommendations. You will then need to call the office and see if the doctor you have chosen will accept you as a new patient and what types of options they offer for patients who are self-pay. Some doctors offer discounts or will set up payment plans for their patients who do not have insurance, but you will need to ask so you aren't surprised when you get to your appointment. ° °2) Contact Your Local Health Department °Not all health departments have doctors that can see patients for sick visits, but many do, so it is worth a call to see if yours does. If you don't know where your local health department is, you can check in your phone book. The CDC also has a tool to help you locate your state's health department, and many state websites also have listings of all of their local health departments. ° °3) Find a Walk-in Clinic °If your illness is not likely to be very severe or complicated, you may want to try a walk in clinic. These are popping up all over the country in pharmacies, drugstores, and shopping centers. They're usually staffed by nurse practitioners or physician assistants that have been trained to treat common illnesses and complaints. They're usually fairly quick and inexpensive. However, if you have serious medical issues or chronic medical problems, these are probably not your best option. ° °No Primary Care Doctor: °- Call [facility] at  832-8000 - they can help you  locate a primary care doctor that  accepts your insurance, provides certain services, etc. °- Physician Referral Service- 1-800-533-3463 ° °Chronic Pain Problems: °Organization         Address  Phone   Notes  °[facility]  (336) 297-2271 Patients need to be referred by their primary care doctor.  ° °Medication Assistance: °Organization         Address  Phone   Notes  °[place] [facility] [address] °[place], [place] [address] (336) 641-8030 --Must be a resident of [place] °-- Must have NO insurance coverage whatsoever (no Medicaid/ Medicare, etc.) °-- The pt. MUST have a primary care doctor that directs their care regularly and follows them in the community °  °[facility]  (866) 331-1348   °[employer]  (888) 892-1162   ° °Agencies that provide inexpensive medical care: °Organization         Address  Phone   Notes  °[facility]  (336) 832-8035   °[facility]    (336) 832-7272   °[facility] [address] °[place], [place] [address] (336) 832-4777   °[facility] [address], ° (336) 271-4999   °[facility]    (336) 373-0678   °[facility]    (336) 272-1050   °  [facility] ° [address], [place] Phone:  (336) 832-4444, Fax:  (336) 832-4440 Hours of Operation:  9 am - 6 pm, M-F.  Also accepts Medicaid/Medicare and self-pay.  °[facility] ° [address], [place] Phone: (336) 832-3150, Fax: (336) 832-3151. Hours of Operation:  8:30 am - 5:30 pm, M-F.  Also accepts Medicaid and self-pay.  °[facility] [place] [address], [place] Phone: (336) 878-6027   °[facility] [address], [place], [place] (336)723-1848, Ext. 123 Mondays & Thursdays: 7-9 AM.  First 15 patients are seen on a first come, first serve basis. °  ° °Medicaid-accepting [place]  Providers: ° °Organization         Address  Phone   Notes  °[facility] [address], [place] (336) 641-2100 Also accepts self-pay patients.  °[facility] [address], [place] ° (336) 856-9996   °[facility] [address], [place] (336) 288-8857   °[facility] [address] [place] [address], [place] (336) 299-7000   °Veita Bland [address], [place]  ° (336) 373-1557 Only accepts [place] Access Medicaid patients after they have their name applied to their card.  ° °Self-Pay (no insurance) in [place]: ° °Organization         Address  Phone   Notes  °Sickle Cell Patients, [facility] [address], [place] (336) 832-1970   °[facility] [address], [place] (336) 832-4400   °[facility] ° [address], [place] (336) 992-4800   °[facility]/Dr. Osei-Bonsu ° [address] [place] [address], [place] or [address], [place] (336) 841-8500 Phone number for both [place] and [place] locations is the same.  °[facility] [address], [place] (336) 299-0000   °[facility] [address] [place] [address], [place] or [address] (336) 852-7530 °(336) 878-2260   °[facility] [address], [place] (336) 350-1642, phone; (336) 294-5005, fax Sees patients 1st and 3rd Saturday of every month.  Must not qualify for public or private insurance (i.e. Medicaid, Medicare, [place] Health Choice, Veterans' Benefits) • Household income should be no more than 200% of the poverty level •The clinic cannot treat you if you are pregnant or think you are pregnant • Sexually transmitted diseases are not treated at the clinic.  ° ° °Dental Care: °Organization         Address  Phone  Notes  °[facility] [address], [place] (336) 641-6152 Accepts children up to age 21 who are enrolled in Medicaid or [place] Health Choice; pregnant women with a Medicaid card; and children who have applied for Medicaid or [place] Health Choice, but were declined, whose parents can pay a reduced fee at time of service.  °[facility] [place]  [address], [place] (336) 641-7733 Accepts children up to age 21 who are enrolled in Medicaid or [place] Health Choice; pregnant women with a Medicaid card; and children who have applied for Medicaid or  Health Choice, but were declined, whose parents can pay a reduced fee at time of service.  °[facility] ° [address], [place] (336) 641-4533 Patients are seen by appointment only. Walk-ins are not accepted. [facility] will see patients 18 years of age and older. °Monday - Tuesday (8am-5pm) °Most Wednesdays (8:30-5pm) °$30 per visit, cash only  °[facility] ° [address], [place] (336) 641-4533 Patients are seen by appointment only. Walk-ins are not accepted. [facility] will see patients 18 years of age and older. °One Wednesday Evening (Monthly: Volunteer Based).  $30 per visit, cash only  °[facility]  (919) 537-3737 for adults; Children under age 4, call Graduate Pediatric Dentistry at (919) [phone]. Children aged 4-14, please call (919) [phone] to request a pediatric application. ° Dental services are provided in all areas of dental care including fillings, crowns and bridges, complete and partial dentures, implants, gum treatment, root canals, and extractions. Preventive care is also provided. Treatment is provided to both adults and children. °Patients are selected via a lottery and there is often a waiting list. °  °[facility] [address], °[place] ° (336) 763-8833 [email or web address] °  °[facility]  [address], [place], [place] (336)723-1848, Ext. 123 Second and Fourth Thursday of each month, opens at 6:30 AM; Clinic ends at 9 AM.  Patients are seen on a first-come first-served basis, and a limited number are seen during each clinic.  ° °[facility] ° [address], [place], [place] (336) 723-7904   Eligibility Requirements °You must have lived in [place], [place], or [place] counties for at least the last three months. °  You cannot be eligible for state or federal sponsored healthcare insurance, including Veterans Administration, Medicaid, or Medicare. °  You generally cannot be eligible for healthcare insurance through your employer.  °  How to apply: °Eligibility screenings are held every Tuesday and Wednesday afternoon from 1:00 pm until 4:00 pm. You do not need an appointment for the interview!  °[facility] [address], [place], [place] 336-631-2330   °[facility]  336-342-8273   °[facility]  336-703-3100   °[facility]  336-570-6415   ° °Behavioral Health Resources in the Community: °Intensive Outpatient Programs °Organization         Address  Phone  Notes  °[facility] [address], [place], [place] 336-878-6098   °[facility] [address], [place], [place] 336-832-9800   °[facility] [address], [place], [place] ° 336-882-2125   °[facility] [address],  °[place], [place] 1-800-853-5163 or 336-641-4981   °Substance Abuse Resources °Organization         Address  Phone  Notes  °[facility]  336-882-2125   °[facility]  336-784-9470   °[facility]  336-285-9073   °[facility]  336-845-3988   °Residential & Outpatient Substance Abuse Program  1-800-659-3381   °Psychological Services °Organization         Address  Phone  Notes  °[facility]  336- 832-9600     °[facility]  336- 378-7881   °[facility] [address], [place] 1-800-853-5163 or 336-641-4981   ° °Mobile Crisis Teams °Organization         Address  Phone  Notes  °[facility], Mobile Crisis Care Unit  1-877-626-1772   °Assertive °[facility] ° [address] [place],  336-834-9664   °  Sharon DeEsch [address] °[place] 336-554-5454   ° °Self-Help/Support Groups °Organization         Address  Phone             Notes  °[facility] [place] - variety of support groups  336- 373-1402 Call for more information  °Narcotics Anonymous (NA), [facility] [address], °[place]  2 meetings at this location  ° °Residential Treatment Programs °Organization         Address  Phone  Notes  °[facility] [address],    °[place]  1-866-801-8205   °[facility] ° [address], [place], [place] 704-293-8524   °[facility] [address], [place] 336-845-3988 Admissions: 8am-3pm M-F  °[facility] [address]    °[place], [place] 336-841-1104   °[name] [address], [place], [place] 336-379-7146   °[facility] [address]  °[place], [place] 336-285-9073   °[facility] - Intensive Outpatient [address], [place], [place] 336-852-3033   °[facility] [address]  °[place], [place] 1-877-615-2722 or 336-784-9470   °[facility] [address] [place], [place] 336-227-7417 Accepts Medicaid  °[facility] [address]  °[place] 1-800-659-3381 Substance Abuse/Addiction Treatment  ° °[place] Behavioral Health Resources °Organization         Address  Phone  Notes  °[facility]  (888) 581-9988   °Julie Brannon, PhD [address] [place], [place]   (336) 349-5553 or (336) 951-0000    °[facility]   [address] °[place], [place] (336) 349-4454   °[facility] [address], [place], [place] (336) 342-8316 Insurance/Medicaid/sponsorship through [facility]  °[facility] [address]                                    [place], [place] (336) 342-8316 Therapy/tele-psych/case  °[facility] [address]  ° [place], [place] (336) 349-2233    °Dr. Arfeen  (336) 349-4544   °[facility] [place]  [employer] [place] [facility] 1) [address], [place] °2) [address], [place] °3)  [address], [place] (336) 349-3220 °(336) 342-7768 ° °(336) 342-8140   °[place] [facility] (336) 342-1394 or (336) 342-3537 (After Hours)    ° ° ° °

## 2014-04-20 NOTE — ED Provider Notes (Signed)
Medical screening examination/treatment/procedure(s) were performed by non-physician practitioner and as supervising physician I was immediately available for consultation/collaboration.  Ephraim Hamburger, MD 04/20/14 [phone]

## 2014-09-08 ENCOUNTER — Inpatient Hospital Stay (HOSPITAL_COMMUNITY)
Admission: AD | Admit: 2014-09-08 | Discharge: 2014-09-08 | Discharge status: Against Medical Advice | Payer: 59 | Source: Ambulatory Visit | Attending: Family Medicine | Admitting: Family Medicine

## 2014-09-08 DIAGNOSIS — R109 Unspecified abdominal pain: Secondary | ICD-10-CM | POA: Insufficient documentation

## 2014-09-08 DIAGNOSIS — N939 Abnormal uterine and vaginal bleeding, unspecified: Secondary | ICD-10-CM | POA: Insufficient documentation

## 2014-09-08 DIAGNOSIS — Z5320 Procedure and treatment not carried out because of patient's decision for unspecified reasons: Secondary | ICD-10-CM | POA: Insufficient documentation

## 2014-09-08 LAB — URINALYSIS, ROUTINE W REFLEX MICROSCOPIC
BILIRUBIN URINE: NEGATIVE
Glucose, UA: NEGATIVE mg/dL
Hgb urine dipstick: NEGATIVE
Ketones, ur: NEGATIVE mg/dL
Nitrite: NEGATIVE
Protein, ur: NEGATIVE mg/dL
Specific Gravity, Urine: >1.03 — ABNORMAL HIGH (ref 1.005–1.030)
Urobilinogen, UA: 0.2 mg/dL (ref 0.0–1.0)
pH: 5.5 (ref 5.0–8.0)

## 2014-09-08 LAB — URINE MICROSCOPIC-ADD ON

## 2014-09-08 LAB — POCT PREGNANCY, URINE: Preg Test, Ur: NEGATIVE

## 2014-09-08 NOTE — MAU Note (Signed)
Not in lobby

## 2014-09-08 NOTE — MAU Note (Signed)
#  3 not in lobby 

## 2014-09-08 NOTE — MAU Note (Signed)
Been spotting in between cycles.  Has had some abd pain.  This is the first month this has happened.

## 2014-10-02 ENCOUNTER — Encounter (HOSPITAL_COMMUNITY): Payer: Self-pay | Admitting: *Deleted

## 2014-10-02 ENCOUNTER — Inpatient Hospital Stay (HOSPITAL_COMMUNITY)
Admission: AD | Admit: 2014-10-02 | Discharge: 2014-10-02 | Disposition: A | Discharge status: Home / Self Care | Payer: Self-pay | Source: Ambulatory Visit | Attending: Family Medicine | Admitting: Family Medicine

## 2014-10-02 DIAGNOSIS — A599 Trichomoniasis, unspecified: Secondary | ICD-10-CM

## 2014-10-02 DIAGNOSIS — Z87891 Personal history of nicotine dependence: Secondary | ICD-10-CM | POA: Insufficient documentation

## 2014-10-02 DIAGNOSIS — A5901 Trichomonal vulvovaginitis: Secondary | ICD-10-CM | POA: Insufficient documentation

## 2014-10-02 DIAGNOSIS — N898 Other specified noninflammatory disorders of vagina: Secondary | ICD-10-CM | POA: Insufficient documentation

## 2014-10-02 LAB — WET PREP, GENITAL: Yeast Wet Prep HPF POC: NONE SEEN

## 2014-10-02 LAB — URINALYSIS, ROUTINE W REFLEX MICROSCOPIC
Bilirubin Urine: NEGATIVE
Glucose, UA: NEGATIVE mg/dL
Ketones, ur: NEGATIVE mg/dL
Nitrite: NEGATIVE
PH: 5.5 (ref 5.0–8.0)
Protein, ur: NEGATIVE mg/dL
Specific Gravity, Urine: >1.03 — ABNORMAL HIGH (ref 1.005–1.030)
Urobilinogen, UA: 0.2 mg/dL (ref 0.0–1.0)

## 2014-10-02 LAB — URINE MICROSCOPIC-ADD ON

## 2014-10-02 LAB — POCT PREGNANCY, URINE: Preg Test, Ur: NEGATIVE

## 2014-10-02 MED ORDER — METRONIDAZOLE 500 MG PO TABS
2000.0000 mg | ORAL_TABLET | Freq: Once | ORAL | Status: AC
  Administered 2014-10-02: 2000 mg via ORAL
  Filled 2014-10-02: qty 4

## 2014-10-02 NOTE — MAU Provider Note (Signed)
History     CSN: [id number]  Arrival date and time: 10/02/14 1116   First Provider Initiated Contact with Patient 10/02/14 1315      Chief Complaint  Patient presents with  . pelvic pressure   . Vaginal Discharge   HPI  [name] is a 39 y.o. [phone] who presents to MAU today with complaint of pelvic pressure and vaginal discharge x 1 month. The patient states it is a yellow, thin discharge. She noted spotting last month, but none recently. She states a discomfort in the lower pelvic area more on the left than right, but denies pain. She denies UTI symptoms, fever, N/V/D or constipation. She is sexually active and uses condoms sometimes.   OB History    Gravida Para Term Preterm AB TAB SAB Ectopic Multiple Living   4 3 3  1     3       Past Medical History  Diagnosis Date  . Asthma   . Anxiety   . Bipolar disorder   . Depression     Past Surgical History  Procedure Laterality Date  . Tubal ligation  2003    Family History  Problem Relation Age of Onset  . Drug abuse Mother   . Drug abuse Father   . Depression Brother   . Depression Maternal Grandmother   . Schizophrenia Cousin     History  Substance Use Topics  . Smoking status: Former Research scientist (life sciences)  . Smokeless tobacco: Not on file  . Alcohol Use: No    Allergies:  Allergies  Allergen Reactions  . Shellfish Allergy Anaphylaxis and Hives    Prescriptions prior to admission  Medication Sig Dispense Refill Last Dose  . ibuprofen (ADVIL,MOTRIN) 200 MG tablet Take 400 mg by mouth every 6 (six) hours as needed for moderate pain.   10/02/2014 at Unknown time  . Lysine 1000 MG TABS Take 1 tablet by mouth daily.   10/01/2014 at Unknown time  . Melatonin 3 MG TABS Take 2 tablets by mouth at bedtime.   10/01/2014 at Unknown time  . albuterol (PROVENTIL HFA;VENTOLIN HFA) 108 (90 BASE) MCG/ACT inhaler Inhale 2 puffs into the lungs every 6 (six) hours as needed for wheezing or shortness of breath.   unknown  .  EPINEPHrine (EPIPEN JR) 0.15 MG/0.3ML injection Inject 0.15 mg into the muscle as needed for anaphylaxis.   unknown    Review of Systems  Constitutional: Negative for fever and malaise/fatigue.  Gastrointestinal: Negative for nausea, vomiting, abdominal pain, diarrhea and constipation.  Genitourinary: Negative for dysuria, urgency and frequency.       Neg - vaginal bleeding + vaginal discharge   Physical Exam   Blood pressure 142/82, pulse 94, temperature 98.7 F (37.1 C), resp. rate 16, last menstrual period 09/10/2014.  Physical Exam  Constitutional: She is oriented to person, place, and time. She appears well-developed and well-nourished. No distress.  HENT:  Head: Normocephalic.  Cardiovascular: Normal rate.   Respiratory: [place] normal.  GI: Soft. Bowel sounds are normal. She exhibits no distension and no mass. There is tenderness (mild tenderness to palpation of the LLQ). There is no rebound and no guarding.  Genitourinary: Uterus is not enlarged and not tender. Cervix exhibits friability. Cervix exhibits no motion tenderness and no discharge. Right adnexum displays no mass and no tenderness. Left adnexum displays no mass and no tenderness. No bleeding in the vagina. Vaginal discharge (moderate amount of thin, frothy yellow discharge noted) found.  Neurological: She is alert and  oriented to person, place, and time.  Skin: Skin is warm and dry. No erythema.  Psychiatric: She has a normal mood and affect.   Results for orders placed or performed during the hospital encounter of 10/02/14 (from the past 24 hour(s))  Urinalysis, Routine w reflex microscopic     Status: Abnormal   Collection Time: 10/02/14 11:30 AM  Result Value Ref Range   Color, Urine YELLOW YELLOW   APPearance HAZY (A) CLEAR   Specific Gravity, Urine >1.030 (H) 1.005 - 1.030   pH 5.5 5.0 - 8.0   Glucose, UA NEGATIVE NEGATIVE mg/dL   Hgb urine dipstick TRACE (A) NEGATIVE   Bilirubin Urine NEGATIVE NEGATIVE    Ketones, ur NEGATIVE NEGATIVE mg/dL   Protein, ur NEGATIVE NEGATIVE mg/dL   Urobilinogen, UA 0.2 0.0 - 1.0 mg/dL   Nitrite NEGATIVE NEGATIVE   Leukocytes, UA MODERATE (A) NEGATIVE  Urine microscopic-add on     Status: Abnormal   Collection Time: 10/02/14 11:30 AM  Result Value Ref Range   Squamous Epithelial / LPF MANY (A) RARE   WBC, UA 11-20 <3 WBC/hpf   RBC / HPF 0-2 <3 RBC/hpf   Bacteria, UA MANY (A) RARE   Urine-Other MUCOUS PRESENT   Pregnancy, urine POC     Status: None   Collection Time: 10/02/14 11:41 AM  Result Value Ref Range   Preg Test, Ur NEGATIVE NEGATIVE  Wet prep, genital     Status: Abnormal   Collection Time: 10/02/14  1:24 PM  Result Value Ref Range   Yeast Wet Prep HPF POC NONE SEEN NONE SEEN   Trich, Wet Prep MODERATE (A) NONE SEEN   Clue Cells Wet Prep HPF POC FEW (A) NONE SEEN   WBC, Wet Prep HPF POC MODERATE (A) NONE SEEN     MAU Course  Procedures None  MDM UPT - negative UA, wet prep, GC/Chlmayida, HIV and RPR today Urine culture pending 2G Flagyl given in MAU  Assessment and Plan  A: Trichomonas  P: Discharge home Patient treated in MAU. Partner treatment advised GC/Chlmaydia, Urine culture, HIV, RPR pending Patient advised to follow-up with [facility] as needed Patient may return to MAU as needed or if her condition were to change or worsen   Luvenia Redden, PA-C  10/02/2014, 2:08 PM

## 2014-10-02 NOTE — Discharge Instructions (Signed)
Trichomoniasis Trichomoniasis is an infection caused by an organism called Trichomonas. The infection can affect both women and men. In women, the outer female genitalia and the vagina are affected. In men, the penis is mainly affected, but the prostate and other reproductive organs can also be involved. Trichomoniasis is a sexually transmitted infection (STI) and is most often passed to another person through sexual contact.  RISK FACTORS  Having unprotected sexual intercourse.  Having sexual intercourse with an infected partner. SIGNS AND SYMPTOMS  Symptoms of trichomoniasis in women include:  Abnormal gray-green frothy vaginal discharge.  Itching and irritation of the vagina.  Itching and irritation of the area outside the vagina. Symptoms of trichomoniasis in men include:   Penile discharge with or without pain.  Pain during urination. This results from inflammation of the urethra. DIAGNOSIS  Trichomoniasis may be found during a Pap test or physical exam. Your health care provider may use one of the following methods to help diagnose this infection:  Examining vaginal discharge under a microscope. For men, urethral discharge would be examined.  Testing the pH of the vagina with a test tape.  Using a vaginal swab test that checks for the Trichomonas organism. A test is available that provides results within a few minutes.  Doing a culture test for the organism. This is not usually needed. TREATMENT   You may be given medicine to fight the infection. Women should inform their health care provider if they could be or are pregnant. Some medicines used to treat the infection should not be taken during pregnancy.  Your health care provider may recommend over-the-counter medicines or creams to decrease itching or irritation.  Your sexual partner will need to be treated if infected. HOME CARE INSTRUCTIONS   Take medicines only as directed by your health care provider.  Take  over-the-counter medicine for itching or irritation as directed by your health care provider.  Do not have sexual intercourse while you have the infection.  Women should not douche or wear tampons while they have the infection.  Discuss your infection with your partner. Your partner may have gotten the infection from you, or you may have gotten it from your partner.  Have your sex partner get examined and treated if necessary.  Practice safe, informed, and protected sex.  See your health care provider for other STI testing. SEEK MEDICAL CARE IF:   You still have symptoms after you finish your medicine.  You develop abdominal pain.  You have pain when you urinate.  You have bleeding after sexual intercourse.  You develop a rash.  Your medicine makes you sick or makes you throw up (vomit). MAKE SURE YOU:  Understand these instructions.  Will watch your condition.  Will get help right away if you are not doing well or get worse. Document Released: 01/17/2001 Document Revised: 12/08/2013 Document Reviewed: 05/05/2013 [facility] Patient Information 2015 [place], [place]. This information is not intended to replace advice given to you by your health care provider. Make sure you discuss any questions you have with your health care provider.  Sexually Transmitted Disease A sexually transmitted disease (STD) is a disease or infection often passed to another person during sex. However, STDs can be passed through nonsexual ways. An STD can be passed through:  Spit (saliva).  Semen.  Blood.  Mucus from the vagina.  Pee (urine). HOW CAN I LESSEN MY CHANCES OF GETTING AN STD?  Use:  Latex condoms.  Water-soluble lubricants with condoms. Do not use petroleum  jelly or oils.  Dental dams. These are small pieces of latex that are used as a barrier during oral sex.  Avoid having more than one sex partner.  Do not have sex with someone who has other sex partners.  Do not have  sex with anyone you do not know or who is at high risk for an STD.  Avoid risky sex that can break your skin.  Do not have sex if you have open sores on your mouth or skin.  Avoid drinking too much alcohol or taking illegal drugs. Alcohol and drugs can affect your good judgment.  Avoid oral and anal sex acts.  Get shots (vaccines) for HPV and hepatitis.  If you are at risk of being infected with HIV, it is advised that you take a certain medicine daily to prevent HIV infection. This is called pre-exposure prophylaxis (PrEP). You may be at risk if:  You are a man who has sex with other men (MSM).  You are attracted to the opposite sex (heterosexual) and are having sex with more than one partner.  You take drugs with a needle.  You have sex with someone who has HIV.  Talk with your doctor about if you are at high risk of being infected with HIV. If you begin to take PrEP, get tested for HIV first. Get tested every 3 months for as long as you are taking PrEP. WHAT SHOULD I DO IF I THINK I HAVE AN STD?  See your doctor.  Tell your sex partner(s) that you have an STD. They should be tested and treated.  Do not have sex until your doctor says it is okay. WHEN SHOULD I GET HELP? Get help right away if:  You have bad belly (abdominal) pain.  You are a man and have puffiness (swelling) or pain in your testicles.  You are a woman and have puffiness in your vagina. Document Released: 08/31/2004 Document Revised: 07/29/2013 Document Reviewed: 01/17/2013 [facility] Patient Information 2015 [place], [place]. This information is not intended to replace advice given to you by your health care provider. Make sure you discuss any questions you have with your health care provider.

## 2014-10-02 NOTE — MAU Note (Signed)
Pt presents to MAU with complaints of pelvic pressure and vaginal discharge for approximately a month. Denies any abdominal cramping or vaginal bleeding

## 2014-10-03 LAB — HIV ANTIBODY (ROUTINE TESTING W REFLEX): HIV Screen 4th Generation wRfx: NONREACTIVE

## 2014-10-03 LAB — RPR: RPR Ser Ql: NONREACTIVE

## 2014-10-04 LAB — URINE CULTURE
CULTURE: NO GROWTH
Colony Count: NO GROWTH

## 2014-10-05 LAB — GC/CHLAMYDIA PROBE AMP (~~LOC~~) NOT AT ARMC
CHLAMYDIA, DNA PROBE: NEGATIVE
Neisseria Gonorrhea: NEGATIVE

## 2015-05-24 ENCOUNTER — Inpatient Hospital Stay (HOSPITAL_COMMUNITY)
Admission: AD | Admit: 2015-05-24 | Discharge: 2015-05-24 | Disposition: A | Discharge status: Home / Self Care | Payer: Self-pay | Source: Ambulatory Visit | Attending: Family Medicine | Admitting: Family Medicine

## 2015-05-24 ENCOUNTER — Encounter (HOSPITAL_COMMUNITY): Payer: Self-pay

## 2015-05-24 DIAGNOSIS — B9689 Other specified bacterial agents as the cause of diseases classified elsewhere: Secondary | ICD-10-CM | POA: Insufficient documentation

## 2015-05-24 DIAGNOSIS — N760 Acute vaginitis: Secondary | ICD-10-CM | POA: Insufficient documentation

## 2015-05-24 DIAGNOSIS — A499 Bacterial infection, unspecified: Secondary | ICD-10-CM

## 2015-05-24 DIAGNOSIS — R1030 Lower abdominal pain, unspecified: Secondary | ICD-10-CM | POA: Insufficient documentation

## 2015-05-24 LAB — URINALYSIS, ROUTINE W REFLEX MICROSCOPIC
Bilirubin Urine: NEGATIVE
Glucose, UA: NEGATIVE mg/dL
Hgb urine dipstick: NEGATIVE
Ketones, ur: 15 mg/dL — AB
LEUKOCYTES UA: NEGATIVE
NITRITE: NEGATIVE
PH: 6 (ref 5.0–8.0)
Protein, ur: NEGATIVE mg/dL
Specific Gravity, Urine: 1.025 (ref 1.005–1.030)
Urobilinogen, UA: 0.2 mg/dL (ref 0.0–1.0)

## 2015-05-24 LAB — POCT PREGNANCY, URINE: Preg Test, Ur: NEGATIVE

## 2015-05-24 LAB — WET PREP, GENITAL
TRICH WET PREP: NONE SEEN
YEAST WET PREP: NONE SEEN

## 2015-05-24 MED ORDER — METRONIDAZOLE 500 MG PO TABS: 500.0000 mg | ORAL_TABLET | Freq: Two times a day (BID) | ORAL | Status: DC

## 2015-05-24 NOTE — Discharge Instructions (Signed)

## 2015-05-24 NOTE — MAU Note (Signed)
Patient present with vaginal discomfort x4 weeks and abdominal cramping x3 weeks.

## 2015-05-24 NOTE — MAU Provider Note (Signed)
History     CSN: [id number]  Arrival date and time: 05/24/15 1752   First Provider Initiated Contact with Patient 05/24/15 1836      Chief Complaint  Patient presents with  . Abdominal Pain    [name] is a 39 y.o. female [phone] at Unknown presenting to MAU with abdominal pain and abnormal vaginal discharge. 1 month ago she  started noticing lower abdominal pain and discharge. She had sex with a condom at the beginning of septemember, however feels that something is wrong.   Discharge is light, pale yellow without an odor.   Abdominal Pain This is a recurrent problem. The current episode started more than 1 month ago. The onset quality is undetermined. The problem occurs intermittently. The pain is located in the LLQ, RLQ and suprapubic region. The pain is at a severity of 1/10. The quality of the pain is aching and cramping. Pertinent negatives include no constipation, diarrhea, dysuria, fever, nausea or vomiting.      OB History    Gravida Para Term Preterm AB TAB SAB Ectopic Multiple Living   4 3 3  1 1    3       Past Medical History  Diagnosis Date  . Asthma   . Anxiety   . Bipolar disorder ([place])   . Depression     Past Surgical History  Procedure Laterality Date  . Tubal ligation  2003    Family History  Problem Relation Age of Onset  . Drug abuse Mother   . Drug abuse Father   . Depression Brother   . Depression Maternal Grandmother   . Schizophrenia Cousin     Social History  Substance Use Topics  . Smoking status: Former Research scientist (life sciences)  . Smokeless tobacco: None  . Alcohol Use: No    Allergies:  Allergies  Allergen Reactions  . Shellfish Allergy Anaphylaxis and Hives    Prescriptions prior to admission  Medication Sig Dispense Refill Last Dose  . albuterol (PROVENTIL HFA;VENTOLIN HFA) 108 (90 BASE) MCG/ACT inhaler Inhale 2 puffs into the lungs every 6 (six) hours as needed for wheezing or shortness of breath.   05/23/2015 at Unknown time   . benzocaine-resorcinol (VAGISIL) 5-2 % vaginal cream Place 1 application vaginally at bedtime.   Past Week at Unknown time  . Cyanocobalamin (B-12 PO) Take 1 tablet by mouth daily.   Past Week at Unknown time  . DM-Doxylamine-Acetaminophen (NYQUIL COLD & FLU PO) Take 2 capsules by mouth once.   05/23/2015 at Unknown time  . ibuprofen (ADVIL,MOTRIN) 200 MG tablet Take 400 mg by mouth every 6 (six) hours as needed for moderate pain.   Past Month at Unknown time  . Melatonin 3 MG TABS Take 2 tablets by mouth at bedtime.   Past Week at Unknown time  . EPINEPHrine (EPIPEN JR) 0.15 MG/0.3ML injection Inject 0.15 mg into the muscle as needed for anaphylaxis.   rescue   Results for orders placed or performed during the hospital encounter of 05/24/15 (from the past 24 hour(s))  Urinalysis, Routine w reflex microscopic (not at [facility])     Status: Abnormal   Collection Time: 05/24/15  6:00 PM  Result Value Ref Range   Color, Urine YELLOW YELLOW   APPearance CLEAR CLEAR   Specific Gravity, Urine 1.025 1.005 - 1.030   pH 6.0 5.0 - 8.0   Glucose, UA NEGATIVE NEGATIVE mg/dL   Hgb urine dipstick NEGATIVE NEGATIVE   Bilirubin Urine NEGATIVE NEGATIVE   Ketones, ur  15 (A) NEGATIVE mg/dL   Protein, ur NEGATIVE NEGATIVE mg/dL   Urobilinogen, UA 0.2 0.0 - 1.0 mg/dL   Nitrite NEGATIVE NEGATIVE   Leukocytes, UA NEGATIVE NEGATIVE  Pregnancy, urine POC     Status: None   Collection Time: 05/24/15  6:04 PM  Result Value Ref Range   Preg Test, Ur NEGATIVE NEGATIVE  Wet prep, genital     Status: Abnormal   Collection Time: 05/24/15  6:45 PM  Result Value Ref Range   Yeast Wet Prep HPF POC NONE SEEN NONE SEEN   Trich, Wet Prep NONE SEEN NONE SEEN   Clue Cells Wet Prep HPF POC FEW (A) NONE SEEN   WBC, Wet Prep HPF POC MODERATE (A) NONE SEEN    Review of Systems  Constitutional: Negative for fever and chills.  Gastrointestinal: Positive for abdominal pain. Negative for nausea, vomiting, diarrhea and  constipation.  Genitourinary: Negative for dysuria.   Physical Exam   Blood pressure 140/86, pulse 96, temperature 98.3 F (36.8 C), temperature source Oral, resp. rate 18, height 5\' 5"  (1.651 m).  Physical Exam  Constitutional: She is oriented to person, place, and time. She appears well-developed and well-nourished. No distress.  HENT:  Head: Normocephalic.  Eyes: Pupils are equal, round, and reactive to light.  GI: Soft. She exhibits no distension. There is no tenderness.  Genitourinary:  Speculum exam: Vagina - Small amount of creamy, pale yellow discharge , no odor Cervix - + contact bleeding with Qtip  Bimanual exam: Cervix closed Uterus non tender, normal size Adnexa non tender, no masses bilaterally GC/Chlam, wet prep done Chaperone present for exam.  Musculoskeletal: Normal range of motion.  Neurological: She is alert and oriented to person, place, and time.  Skin: Skin is warm. She is not diaphoretic.  Psychiatric: Her behavior is normal.    MAU Course  Procedures  None  MDM  Wet prep  GC  HIV   Assessment and Plan   A:  1. Bacterial vaginosis    P:  Discharge home in stable condition RX: Flagyl; avoid alcohol Return to MAU for emergency's  GC pending    Lezlie Lye, NP 05/24/2015 6:52 PM

## 2015-05-25 LAB — GC/CHLAMYDIA PROBE AMP (~~LOC~~) NOT AT ARMC
Chlamydia: NEGATIVE
Neisseria Gonorrhea: NEGATIVE

## 2015-05-25 LAB — HIV ANTIBODY (ROUTINE TESTING W REFLEX): HIV Screen 4th Generation wRfx: NONREACTIVE

## 2015-10-26 ENCOUNTER — Emergency Department (HOSPITAL_COMMUNITY): Payer: 59

## 2015-10-26 ENCOUNTER — Emergency Department (HOSPITAL_COMMUNITY)
Admission: EM | Admit: 2015-10-26 | Discharge: 2015-10-26 | Disposition: A | Discharge status: Home / Self Care | Payer: 59 | Attending: Emergency Medicine | Admitting: Emergency Medicine

## 2015-10-26 ENCOUNTER — Encounter (HOSPITAL_COMMUNITY): Payer: Self-pay | Admitting: Emergency Medicine

## 2015-10-26 DIAGNOSIS — Z7951 Long term (current) use of inhaled steroids: Secondary | ICD-10-CM | POA: Insufficient documentation

## 2015-10-26 DIAGNOSIS — R079 Chest pain, unspecified: Secondary | ICD-10-CM | POA: Insufficient documentation

## 2015-10-26 DIAGNOSIS — Z8659 Personal history of other mental and behavioral disorders: Secondary | ICD-10-CM | POA: Insufficient documentation

## 2015-10-26 DIAGNOSIS — J4540 Moderate persistent asthma, uncomplicated: Secondary | ICD-10-CM

## 2015-10-26 DIAGNOSIS — Z87891 Personal history of nicotine dependence: Secondary | ICD-10-CM | POA: Insufficient documentation

## 2015-10-26 DIAGNOSIS — J4541 Moderate persistent asthma with (acute) exacerbation: Secondary | ICD-10-CM | POA: Insufficient documentation

## 2015-10-26 MED ORDER — ALBUTEROL SULFATE HFA 108 (90 BASE) MCG/ACT IN AERS
2.0000 | INHALATION_SPRAY | RESPIRATORY_TRACT | Status: DC | PRN
  Filled 2015-10-26: qty 6.7

## 2015-10-26 MED ORDER — AEROCHAMBER PLUS FLO-VU LARGE MISC
1.0000 | Freq: Once | Status: AC
  Administered 2015-10-26: 1
  Filled 2015-10-26: qty 1

## 2015-10-26 MED ORDER — PREDNISONE 10 MG PO TABS: 20.0000 mg | ORAL_TABLET | Freq: Every day | ORAL | Status: DC

## 2015-10-26 MED ORDER — IPRATROPIUM-ALBUTEROL 0.5-2.5 (3) MG/3ML IN SOLN
3.0000 mL | Freq: Once | RESPIRATORY_TRACT | Status: AC
  Administered 2015-10-26: 3 mL via RESPIRATORY_TRACT
  Filled 2015-10-26: qty 3

## 2015-10-26 MED ORDER — PREDNISONE 20 MG PO TABS
60.0000 mg | ORAL_TABLET | Freq: Once | ORAL | Status: AC
  Administered 2015-10-26: 60 mg via ORAL
  Filled 2015-10-26: qty 3

## 2015-10-26 NOTE — ED Notes (Signed)
Pt states that she has been struggling to breath today with her asthma and feels tight across her chest and back now. Alert and oriented.

## 2015-10-26 NOTE — ED Provider Notes (Signed)
CSN: [id number]     Arrival date & time 10/26/15  0004 History   First MD Initiated Contact with Patient 10/26/15 [phone]     Chief Complaint  Patient presents with  . Asthma  . Chest Pain     (Consider location/radiation/quality/duration/timing/severity/associated sxs/prior Treatment) HPI Comments: This is a 28 year family history of asthma.  He does not currently have a physician.  She is using her brother's inhaler without relief.  She states that she is using this inhaler 8-10 times a day.  Patient is a 40 y.o. female presenting with asthma and chest pain. The history is provided by the patient.  Asthma This is a chronic problem. The problem occurs constantly. The problem has been gradually worsening. Associated symptoms include chest pain. Pertinent negatives include no chills, coughing, fever, nausea or vomiting. The symptoms are aggravated by exertion. Treatments tried: Albuterol inhaler. The treatment provided no relief.  Chest Pain Associated symptoms: no cough, no fever, no nausea and not vomiting     Past Medical History  Diagnosis Date  . Asthma   . Anxiety   . Bipolar disorder ([place])   . Depression    Past Surgical History  Procedure Laterality Date  . Tubal ligation  2003   Family History  Problem Relation Age of Onset  . Drug abuse Mother   . Drug abuse Father   . Depression Brother   . Depression Maternal Grandmother   . Schizophrenia Cousin    Social History  Substance Use Topics  . Smoking status: Former Research scientist (life sciences)  . Smokeless tobacco: None  . Alcohol Use: No   OB History    Gravida Para Term Preterm AB TAB SAB Ectopic Multiple Living   4 3 3  1 1    3      Review of Systems  Constitutional: Negative for fever and chills.  Respiratory: Positive for wheezing. Negative for cough.   Cardiovascular: Positive for chest pain.  Gastrointestinal: Negative for nausea and vomiting.  All other systems reviewed and are negative.     Allergies  Shellfish  allergy  Home Medications   Prior to Admission medications   Medication Sig Start Date End Date Taking? Authorizing Provider  beclomethasone (QVAR) 80 MCG/ACT inhaler Inhale 2 puffs into the lungs 2 (two) times daily as needed (SOB, wheezing).   Yes Historical Provider, MD  metroNIDAZOLE (FLAGYL) 500 MG tablet Take 1 tablet (500 mg total) by mouth 2 (two) times daily. Patient not taking: Reported on 10/26/2015 05/24/15   Lezlie Lye, NP  predniSONE (DELTASONE) 10 MG tablet Take 2 tablets (20 mg total) by mouth daily with breakfast. 10/26/15   Junius Creamer, NP   BP 124/63 mmHg  Pulse 74  Temp(Src) 98.3 F (36.8 C) (Oral)  Resp 20  SpO2 98%  LMP 09/29/2015 (Approximate) Physical Exam  Constitutional: She appears well-developed and well-nourished.  HENT:  Head: Normocephalic.  Eyes: Pupils are equal, round, and reactive to light.  Neck: Normal range of motion.  Cardiovascular: Normal rate and regular rhythm.   Pulmonary/Chest: Effort normal. She has wheezes. She exhibits tenderness.  Abdominal: Soft.  Musculoskeletal: Normal range of motion.  Neurological: She is alert.  Skin: Skin is warm.  Nursing note and vitals reviewed.   ED Course  Procedures (including critical care time) Labs Review Labs Reviewed - No data to display  Imaging Review Dg Chest 2 View  10/26/2015  CLINICAL DATA:  Chest pain and shortness of breath for 2 days. History of asthma.  EXAM: CHEST  2 VIEW COMPARISON:  06/01/2009 FINDINGS: The heart size and mediastinal contours are within normal limits. Both lungs are clear. The visualized skeletal structures are unremarkable. IMPRESSION: No active cardiopulmonary disease. Electronically Signed   By: Lucienne Capers M.D.   On: 10/26/2015 01:11   I have personally reviewed and evaluated these images and lab results as part of my medical decision-making.   EKG Interpretation None    Patient needs better medical management.  She will be referred to [facility].  I will supply her with an albuterol inhaler as well as a spacer and start her on oral prednisone  After respiratory treatment.  Patient is breathing much more air.  She feels like she is getting a full lung of oxygen with each breath.  She will be discharged home with albuterol inhaler and spacer and a steroid taper MDM   Final diagnoses:  Asthma, moderate persistent, uncomplicated         Junius Creamer, NP 10/26/15 0428  Junius Creamer, NP 10/26/15 [place], MD 10/27/15 [id number]

## 2015-10-26 NOTE — ED Notes (Addendum)
Pt states, "I feel so much better after that breathing treatment."

## 2015-10-26 NOTE — ED Notes (Signed)
PA at bedside.

## 2015-10-26 NOTE — Discharge Instructions (Signed)
You have been provided with an inhaler and spacer.  Please use this as needed , preferably every 4-6 hours while awake.  You have also been given a prescription for prednisone .  Please take this as directed until all tablets have been completed.  You have been given a referral to [facility].  Please call and start the process for qualification for regular health care at which time I would like you to discuss your asthma and increased therapy   Asthma, Adult Asthma is a condition of the lungs in which the airways tighten and narrow. Asthma can make it hard to breathe. Asthma cannot be cured, but medicine and lifestyle changes can help control it. Asthma may be started (triggered) by:  Animal skin flakes (dander).  Dust.  Cockroaches.  Pollen.  Mold.  Smoke.  Cleaning products.  Hair sprays or aerosol sprays.  Paint fumes or strong smells.  Cold air, weather changes, and winds.  Crying or laughing hard.  Stress.  Certain medicines or drugs.  Foods, such as dried fruit, potato chips, and sparkling grape juice.  Infections or conditions (colds, flu).  Exercise.  Certain medical conditions or diseases.  Exercise or tiring activities. HOME CARE   Take medicine as told by your doctor.  Use a peak flow meter as told by your doctor. A peak flow meter is a tool that measures how well the lungs are working.  Record and keep track of the peak flow meter's readings.  Understand and use the asthma action plan. An asthma action plan is a written plan for taking care of your asthma and treating your attacks.  To help prevent asthma attacks:  Do not smoke. Stay away from secondhand smoke.  Change your heating and air conditioning filter often.  Limit your use of fireplaces and wood stoves.  Get rid of pests (such as roaches and mice) and their droppings.  Throw away plants if you see mold on them.  Clean your floors. Dust regularly. Use cleaning products  that do not smell.  Have someone vacuum when you are not home. Use a vacuum cleaner with a HEPA filter if possible.  Replace carpet with wood, tile, or vinyl flooring. Carpet can trap animal skin flakes and dust.  Use allergy-proof pillows, mattress covers, and box spring covers.  Wash bed sheets and blankets every week in hot water and dry them in a dryer.  Use blankets that are made of polyester or cotton.  Clean bathrooms and kitchens with bleach. If possible, have someone repaint the walls in these rooms with mold-resistant paint. Keep out of the rooms that are being cleaned and painted.  Wash hands often. GET HELP IF:  You have make a whistling sound when breaking (wheeze), have shortness of breath, or have a cough even if taking medicine to prevent attacks.  The colored mucus you cough up (sputum) is thicker than usual.  The colored mucus you cough up changes from clear or white to yellow, green, gray, or bloody.  You have problems from the medicine you are taking such as:  A rash.  Itching.  Swelling.  Trouble breathing.  You need reliever medicines more than 2-3 times a week.  Your peak flow measurement is still at 50-79% of your personal best after following the action plan for 1 hour.  You have a fever. GET HELP RIGHT AWAY IF:   You seem to be worse and are not responding to medicine during an asthma attack.  You  are short of breath even at rest.  You get short of breath when doing very little activity.  You have trouble eating, drinking, or talking.  You have chest pain.  You have a fast heartbeat.  Your lips or fingernails start to turn blue.  You are light-headed, dizzy, or faint.  Your peak flow is less than 50% of your personal best.   This information is not intended to replace advice given to you by your health care provider. Make sure you discuss any questions you have with your health care provider.   Document Released: 01/10/2008  Document Revised: 04/14/2015 Document Reviewed: 02/20/2013 Elsevier Interactive Patient Education [employer].

## 2015-11-11 ENCOUNTER — Encounter (HOSPITAL_COMMUNITY): Payer: Self-pay

## 2015-11-11 ENCOUNTER — Inpatient Hospital Stay (HOSPITAL_COMMUNITY)
Admission: AD | Admit: 2015-11-11 | Discharge: 2015-11-11 | Disposition: A | Discharge status: Home / Self Care | Payer: 59 | Source: Ambulatory Visit | Attending: Obstetrics & Gynecology | Admitting: Obstetrics & Gynecology

## 2015-11-11 DIAGNOSIS — F419 Anxiety disorder, unspecified: Secondary | ICD-10-CM | POA: Insufficient documentation

## 2015-11-11 DIAGNOSIS — Z87891 Personal history of nicotine dependence: Secondary | ICD-10-CM | POA: Insufficient documentation

## 2015-11-11 DIAGNOSIS — F319 Bipolar disorder, unspecified: Secondary | ICD-10-CM | POA: Insufficient documentation

## 2015-11-11 DIAGNOSIS — Z79899 Other long term (current) drug therapy: Secondary | ICD-10-CM | POA: Insufficient documentation

## 2015-11-11 DIAGNOSIS — J45909 Unspecified asthma, uncomplicated: Secondary | ICD-10-CM | POA: Insufficient documentation

## 2015-11-11 DIAGNOSIS — R350 Frequency of micturition: Secondary | ICD-10-CM | POA: Insufficient documentation

## 2015-11-11 DIAGNOSIS — N898 Other specified noninflammatory disorders of vagina: Secondary | ICD-10-CM | POA: Insufficient documentation

## 2015-11-11 DIAGNOSIS — N9089 Other specified noninflammatory disorders of vulva and perineum: Secondary | ICD-10-CM

## 2015-11-11 LAB — URINALYSIS, ROUTINE W REFLEX MICROSCOPIC
BILIRUBIN URINE: NEGATIVE
GLUCOSE, UA: NEGATIVE mg/dL
Hgb urine dipstick: NEGATIVE
KETONES UR: NEGATIVE mg/dL
Nitrite: NEGATIVE
Protein, ur: NEGATIVE mg/dL
Specific Gravity, Urine: 1.025 (ref 1.005–1.030)
pH: 5 (ref 5.0–8.0)

## 2015-11-11 LAB — WET PREP, GENITAL
CLUE CELLS WET PREP: NONE SEEN
SPERM: NONE SEEN
Trich, Wet Prep: NONE SEEN
Yeast Wet Prep HPF POC: NONE SEEN

## 2015-11-11 LAB — URINE MICROSCOPIC-ADD ON: RBC / HPF: NONE SEEN RBC/hpf (ref 0–5)

## 2015-11-11 MED ORDER — ACYCLOVIR 400 MG PO TABS: 400.0000 mg | ORAL_TABLET | Freq: Three times a day (TID) | ORAL | Status: DC

## 2015-11-11 NOTE — Discharge Instructions (Signed)

## 2015-11-11 NOTE — MAU Provider Note (Signed)
History     CSN: [id number]  Arrival date and time: 11/11/15 [id number]   First Provider Initiated Contact with Patient 11/11/15 1050      Chief Complaint  Patient presents with  . Vaginal Itching   HPI   Alexis Beck is a 40 year old female presenting to [facility] complaining of vaginal irritation. She states that the symptoms began a week ago with increased vaginal discharge and odor. Yesterday the patient noticed a tender vaginal bump, and some increased urinary frequency and burning. She reports having shaved recently and is relating the bump to possible trauma from shaving. She has recently begun to have unprotected sex with her partner and is concerned about STIs -she is unsure of her partner's status. She denies any fever, chills, or abdominal pain.   OB History    Gravida Para Term Preterm AB TAB SAB Ectopic Multiple Living   4 3 3  1 1    3       Past Medical History  Diagnosis Date  . Asthma   . Anxiety   . Bipolar disorder ([place])   . Depression     Past Surgical History  Procedure Laterality Date  . Tubal ligation  2003    Family History  Problem Relation Age of Onset  . Drug abuse Mother   . Drug abuse Father   . Depression Brother   . Depression Maternal Grandmother   . Schizophrenia Cousin     Social History  Substance Use Topics  . Smoking status: Former Research scientist (life sciences)  . Smokeless tobacco: None  . Alcohol Use: No    Allergies:  Allergies  Allergen Reactions  . Shellfish Allergy Anaphylaxis and Hives    Prescriptions prior to admission  Medication Sig Dispense Refill Last Dose  . albuterol (PROVENTIL HFA;VENTOLIN HFA) 108 (90 Base) MCG/ACT inhaler Inhale 1-2 puffs into the lungs every 6 (six) hours as needed for wheezing or shortness of breath.   11/11/2015 at Unknown time  . OVER THE COUNTER MEDICATION Place 1 Dose into both eyes as needed (for eye dryness and irritation). [employer] emergeny eye cleanser   11/11/2015 at Unknown time  .  predniSONE (DELTASONE) 10 MG tablet Take 2 tablets (20 mg total) by mouth daily with breakfast. (Patient not taking: Reported on 11/11/2015) 14 tablet 0 Completed Course at Unknown time   Results for orders placed or performed during the hospital encounter of 11/11/15 (from the past 48 hour(s))  Urinalysis, Routine w reflex microscopic (not at [facility])     Status: Abnormal   Collection Time: 11/11/15 10:00 AM  Result Value Ref Range   Color, Urine YELLOW YELLOW   APPearance CLEAR CLEAR   Specific Gravity, Urine 1.025 1.005 - 1.030   pH 5.0 5.0 - 8.0   Glucose, UA NEGATIVE NEGATIVE mg/dL   Hgb urine dipstick NEGATIVE NEGATIVE   Bilirubin Urine NEGATIVE NEGATIVE   Ketones, ur NEGATIVE NEGATIVE mg/dL   Protein, ur NEGATIVE NEGATIVE mg/dL   Nitrite NEGATIVE NEGATIVE   Leukocytes, UA TRACE (A) NEGATIVE  Urine microscopic-add on     Status: Abnormal   Collection Time: 11/11/15 10:00 AM  Result Value Ref Range   Squamous Epithelial / LPF 0-5 (A) NONE SEEN   WBC, UA 0-5 0 - 5 WBC/hpf   RBC / HPF NONE SEEN 0 - 5 RBC/hpf   Bacteria, UA FEW (A) NONE SEEN  Wet prep, genital     Status: Abnormal   Collection Time: 11/11/15 11:15 AM  Result Value Ref Range   Yeast Wet Prep HPF POC NONE SEEN NONE SEEN   Trich, Wet Prep NONE SEEN NONE SEEN   Clue Cells Wet Prep HPF POC NONE SEEN NONE SEEN   WBC, Wet Prep HPF POC MODERATE (A) NONE SEEN    Comment: MANY BACTERIA SEEN   Sperm NONE SEEN     Review of Systems  Constitutional: Negative for fever and chills.  Gastrointestinal: Negative for abdominal pain.   Physical Exam   Blood pressure 142/73, pulse 78, temperature 98 F (36.7 C), temperature source Oral, resp. rate 18, height 5\' 5"  (1.651 m), weight 197 lb (89.359 kg), last menstrual period 10/28/2015.  Physical Exam  Constitutional: She is oriented to person, place, and time. She appears well-developed and well-nourished. No distress.  HENT:  Head: Normocephalic.  Eyes: Pupils are equal,  round, and reactive to light.  Respiratory: Effort normal.  GI: Soft. She exhibits no distension. There is no tenderness.  Genitourinary:     Speculum exam: Vagina - Small amount of creamy, white discharge, mild odor Cervix - No contact bleeding Bimanual exam: Cervix closed, no CMT  Uterus non tender, normal size Adnexa non tender, no masses bilaterally GC/Chlam, wet prep done Chaperone present for exam. Pinpoint size lesion noted on left labia minora, near clitoris. Tender to touch, herpetic like appearance   Neurological: She is alert and oriented to person, place, and time.  Skin: Skin is warm. She is not diaphoretic.    MAU Course  Procedures  None  MDM  Wet prep GC HSV pending   Assessment and Plan   A:  1. Labial lesion   2. Vaginal irritation     P:  Discharge home in stable condition RX: Acyclovir  Patient will be notified of HSV results. Condoms always Return to MAU if symptoms worsen Patient given the [facility] and the HD contact information and told to call if needed for GYN care.  Lezlie Lye, NP 11/11/2015 1:55 PM

## 2015-11-11 NOTE — MAU Note (Signed)
Pt reports she has had some vaginal irritation for the past week that has gotten a little worse. Haivne some clear discharge.

## 2015-11-12 LAB — GC/CHLAMYDIA PROBE AMP (~~LOC~~) NOT AT ARMC
CHLAMYDIA, DNA PROBE: NEGATIVE
NEISSERIA GONORRHEA: NEGATIVE

## 2015-11-15 LAB — HERPES SIMPLEX VIRUS CULTURE
Culture: NOT DETECTED
SPECIAL REQUESTS: NORMAL

## 2015-11-22 ENCOUNTER — Telehealth: Payer: Self-pay | Admitting: Obstetrics and Gynecology

## 2015-11-22 NOTE — Telephone Encounter (Signed)
patient notified of negative HSV results.

## 2016-02-28 ENCOUNTER — Inpatient Hospital Stay (HOSPITAL_COMMUNITY)
Admission: AD | Admit: 2016-02-28 | Discharge: 2016-02-28 | Disposition: A | Discharge status: Home / Self Care | Payer: 59 | Source: Ambulatory Visit | Attending: Family Medicine | Admitting: Family Medicine

## 2016-02-28 ENCOUNTER — Encounter (HOSPITAL_COMMUNITY): Payer: Self-pay | Admitting: *Deleted

## 2016-02-28 DIAGNOSIS — Z91013 Allergy to seafood: Secondary | ICD-10-CM | POA: Insufficient documentation

## 2016-02-28 DIAGNOSIS — Z79899 Other long term (current) drug therapy: Secondary | ICD-10-CM | POA: Insufficient documentation

## 2016-02-28 DIAGNOSIS — N3000 Acute cystitis without hematuria: Secondary | ICD-10-CM | POA: Insufficient documentation

## 2016-02-28 DIAGNOSIS — J45909 Unspecified asthma, uncomplicated: Secondary | ICD-10-CM | POA: Insufficient documentation

## 2016-02-28 LAB — URINALYSIS, ROUTINE W REFLEX MICROSCOPIC
BILIRUBIN URINE: NEGATIVE
GLUCOSE, UA: 250 mg/dL — AB
HGB URINE DIPSTICK: NEGATIVE
KETONES UR: 15 mg/dL — AB
Nitrite: POSITIVE — AB
PROTEIN: 100 mg/dL — AB
SPECIFIC GRAVITY, URINE: 1.02 (ref 1.005–1.030)
pH: 5 (ref 5.0–8.0)

## 2016-02-28 LAB — URINE MICROSCOPIC-ADD ON

## 2016-02-28 LAB — POCT PREGNANCY, URINE: Preg Test, Ur: NEGATIVE

## 2016-02-28 MED ORDER — METOCLOPRAMIDE HCL 10 MG PO TABS: 10.0000 mg | ORAL_TABLET | Freq: Four times a day (QID) | ORAL | 0 refills | Status: DC | PRN

## 2016-02-28 MED ORDER — SULFAMETHOXAZOLE-TRIMETHOPRIM 800-160 MG PO TABS: 1.0000 | ORAL_TABLET | Freq: Two times a day (BID) | ORAL | 0 refills | Status: DC

## 2016-02-28 NOTE — MAU Note (Signed)
Frequency and urgency. Doesn't feel like emptying bladder, feel pressure. Having symptoms for a wk. Denies fever, no blood in urine

## 2016-02-28 NOTE — MAU Provider Note (Signed)
History     CSN: [id number]  Arrival date and time: 02/28/16 1030   First Provider Initiated Contact with Patient 02/28/16 1229      Chief Complaint  Patient presents with  . Dysuria   Alexis Beck is a 40 y.o. Female who presents with urinary complaints.    Urinary Tract Infection   This is a new problem. The current episode started in the past 7 days. The problem occurs every urination. The problem has been gradually worsening. The pain is at a severity of 0/10. The patient is experiencing no pain. There has been no fever. She is sexually active. There is no history of pyelonephritis. Associated symptoms include frequency, nausea and urgency. Pertinent negatives include no chills, discharge, flank pain, hematuria, possible pregnancy or vomiting. Treatments tried: AZO. The treatment provided no relief. There is no history of catheterization, kidney stones or recurrent UTIs.    OB History    Gravida Para Term Preterm AB Living   4 3 3   1 3    SAB TAB Ectopic Multiple Live Births     1            Past Medical History:  Diagnosis Date  . Anxiety   . Asthma   . Bipolar disorder ([place])   . Depression     Past Surgical History:  Procedure Laterality Date  . TUBAL LIGATION  2003    Family History  Problem Relation Age of Onset  . Drug abuse Mother   . Drug abuse Father   . Depression Brother   . Depression Maternal Grandmother   . Schizophrenia Cousin     Social History  Substance Use Topics  . Smoking status: Never Smoker  . Smokeless tobacco: Not on file  . Alcohol use No    Allergies:  Allergies  Allergen Reactions  . Shellfish Allergy Anaphylaxis and Hives    Prescriptions Prior to Admission  Medication Sig Dispense Refill Last Dose  . acyclovir (ZOVIRAX) 400 MG tablet Take 1 tablet (400 mg total) by mouth 3 (three) times daily. For reoccurring outbreaks: Take 400 mg 3X per day for 5 days. 30 tablet 2   . albuterol (PROVENTIL HFA;VENTOLIN HFA) 108 (90  Base) MCG/ACT inhaler Inhale 1-2 puffs into the lungs every 6 (six) hours as needed for wheezing or shortness of breath.   11/11/2015 at Unknown time  . OVER THE COUNTER MEDICATION Place 1 Dose into both eyes as needed (for eye dryness and irritation). [place] [place] emergeny eye cleanser   11/11/2015 at Unknown time    Review of Systems  Constitutional: Negative for chills and fever.  Gastrointestinal: Positive for nausea. Negative for abdominal pain and vomiting.  Genitourinary: Positive for frequency and urgency. Negative for dysuria, flank pain and hematuria.   Physical Exam   Blood pressure 119/77, pulse 73, temperature 98.4 F (36.9 C), temperature source Oral, resp. rate 18, weight 189 lb 12 oz (86.1 kg), last menstrual period 02/07/2016.  Physical Exam  Nursing note and vitals reviewed. Constitutional: She is oriented to person, place, and time. She appears well-developed and well-nourished. No distress.  HENT:  Head: Normocephalic and atraumatic.  Eyes: Conjunctivae are normal. Right eye exhibits no discharge. Left eye exhibits no discharge. No scleral icterus.  Neck: Normal range of motion.  Cardiovascular: Normal rate, regular rhythm and normal heart sounds.   No murmur heard. Respiratory: Effort normal and breath sounds normal. No respiratory distress. She has no wheezes.  GI: Soft. There is tenderness  in the suprapubic area. There is no CVA tenderness.  Neurological: She is alert and oriented to person, place, and time.  Skin: Skin is warm and dry. She is not diaphoretic.  Psychiatric: She has a normal mood and affect. Her behavior is normal. Judgment and thought content normal.    MAU Course  Procedures Results for orders placed or performed during the hospital encounter of 02/28/16 (from the past 24 hour(s))  Urinalysis, Routine w reflex microscopic (not at [facility])     Status: Abnormal   Collection Time: 02/28/16 10:31 AM  Result Value Ref Range   Color, Urine AMBER (A)  YELLOW   APPearance CLEAR CLEAR   Specific Gravity, Urine 1.020 1.005 - 1.030   pH 5.0 5.0 - 8.0   Glucose, UA 250 (A) NEGATIVE mg/dL   Hgb urine dipstick NEGATIVE NEGATIVE   Bilirubin Urine NEGATIVE NEGATIVE   Ketones, ur 15 (A) NEGATIVE mg/dL   Protein, ur 100 (A) NEGATIVE mg/dL   Nitrite POSITIVE (A) NEGATIVE   Leukocytes, UA TRACE (A) NEGATIVE  Urine microscopic-add on     Status: Abnormal   Collection Time: 02/28/16 10:31 AM  Result Value Ref Range   Squamous Epithelial / LPF 0-5 (A) NONE SEEN   WBC, UA 0-5 0 - 5 WBC/hpf   RBC / HPF 0-5 0 - 5 RBC/hpf   Bacteria, UA FEW (A) NONE SEEN   Urine-Other MUCOUS PRESENT   Pregnancy, urine POC     Status: None   Collection Time: 02/28/16 10:57 AM  Result Value Ref Range   Preg Test, Ur NEGATIVE NEGATIVE    MDM UPT negative No suspicion for kidney stones or pyelo. VSS. NAD Will tx for uncomplicated UTI  Assessment and Plan  A:  1. Acute cystitis without hematuria     P; Discharge home Rx septra & reglan Urine culture pending Discussed reasons to seek further treatment  Jorje Guild 02/28/2016, 12:28 PM

## 2016-02-28 NOTE — Discharge Instructions (Signed)

## 2016-02-29 LAB — URINE CULTURE: Culture: NO GROWTH

## 2016-06-11 ENCOUNTER — Inpatient Hospital Stay (HOSPITAL_COMMUNITY)
Admission: AD | Admit: 2016-06-11 | Discharge: 2016-06-11 | Disposition: A | Discharge status: Home / Self Care | Payer: 59 | Source: Ambulatory Visit | Attending: Obstetrics and Gynecology | Admitting: Obstetrics and Gynecology

## 2016-06-11 DIAGNOSIS — N760 Acute vaginitis: Secondary | ICD-10-CM

## 2016-06-11 DIAGNOSIS — N898 Other specified noninflammatory disorders of vagina: Secondary | ICD-10-CM

## 2016-06-11 DIAGNOSIS — N72 Inflammatory disease of cervix uteri: Secondary | ICD-10-CM

## 2016-06-11 DIAGNOSIS — B9689 Other specified bacterial agents as the cause of diseases classified elsewhere: Secondary | ICD-10-CM

## 2016-06-11 DIAGNOSIS — Z3202 Encounter for pregnancy test, result negative: Secondary | ICD-10-CM | POA: Insufficient documentation

## 2016-06-11 LAB — URINALYSIS, ROUTINE W REFLEX MICROSCOPIC
Bilirubin Urine: NEGATIVE
GLUCOSE, UA: NEGATIVE mg/dL
HGB URINE DIPSTICK: NEGATIVE
Ketones, ur: NEGATIVE mg/dL
LEUKOCYTES UA: NEGATIVE
Nitrite: NEGATIVE
PROTEIN: NEGATIVE mg/dL
Specific Gravity, Urine: >1.03 — ABNORMAL HIGH (ref 1.005–1.030)
pH: 5.5 (ref 5.0–8.0)

## 2016-06-11 LAB — WET PREP, GENITAL
Sperm: NONE SEEN
TRICH WET PREP: NONE SEEN
YEAST WET PREP: NONE SEEN

## 2016-06-11 LAB — POCT PREGNANCY, URINE: Preg Test, Ur: NEGATIVE

## 2016-06-11 MED ORDER — METRONIDAZOLE 500 MG PO TABS: 500.0000 mg | ORAL_TABLET | Freq: Two times a day (BID) | ORAL | 0 refills | Status: DC

## 2016-06-11 MED ORDER — AZITHROMYCIN 250 MG PO TABS
1000.0000 mg | ORAL_TABLET | Freq: Once | ORAL | Status: AC
  Administered 2016-06-11: 1000 mg via ORAL
  Filled 2016-06-11: qty 4

## 2016-06-11 MED ORDER — CEFTRIAXONE SODIUM 250 MG IJ SOLR
250.0000 mg | Freq: Once | INTRAMUSCULAR | Status: AC
  Administered 2016-06-11: 250 mg via INTRAMUSCULAR
  Filled 2016-06-11: qty 250

## 2016-06-11 MED ORDER — FLUCONAZOLE 150 MG PO TABS: 150.0000 mg | ORAL_TABLET | Freq: Every day | ORAL | 0 refills | Status: DC

## 2016-06-11 NOTE — MAU Note (Signed)
Vaginal pain for about a week and foul smell yellowish discharge since yesterday. Pain in the abdominal area "pressure"

## 2016-06-11 NOTE — MAU Provider Note (Signed)
History     CSN: [id number]  Arrival date and time: 06/11/16 0455   First Provider Initiated Contact with Patient 06/11/16 0539      Chief Complaint  Patient presents with  . Vaginal Pain    started like irritaion for about a week  . Vaginal Discharge    foul and yellowish since yeserday   HPI  [name] is 40 y.o. female [phone] with a history of bipolar here in MAU with vaginal pain. She has been in the MAU for this in the past. Symptoms started as irritation last week. She was treated for HSV recently, however culture came back negative. Denies lesions today.  + discharge that is yellow and foul smelling.   No new sexual partners Has an appointment with the HD this week.  Has multiple visits to MAU for various GYN issues.   OB History    Gravida Para Term Preterm AB Living   4 3 3   1 3    SAB TAB Ectopic Multiple Live Births     1            Past Medical History:  Diagnosis Date  . Anxiety   . Asthma   . Bipolar disorder ([place])   . Depression     Past Surgical History:  Procedure Laterality Date  . TUBAL LIGATION  2003    Family History  Problem Relation Age of Onset  . Drug abuse Mother   . Drug abuse Father   . Depression Brother   . Depression Maternal Grandmother   . Schizophrenia Cousin     Social History  Substance Use Topics  . Smoking status: Never Smoker  . Smokeless tobacco: Not on file  . Alcohol use No    Allergies:  Allergies  Allergen Reactions  . Shellfish Allergy Anaphylaxis and Hives    Prescriptions Prior to Admission  Medication Sig Dispense Refill Last Dose  . acyclovir (ZOVIRAX) 400 MG tablet Take 1 tablet (400 mg total) by mouth 3 (three) times daily. For reoccurring outbreaks: Take 400 mg 3X per day for 5 days. 30 tablet 2   . albuterol (PROVENTIL HFA;VENTOLIN HFA) 108 (90 Base) MCG/ACT inhaler Inhale 1-2 puffs into the lungs every 6 (six) hours as needed for wheezing or shortness of breath.   11/11/2015 at Unknown  time  . metoCLOPramide (REGLAN) 10 MG tablet Take 1 tablet (10 mg total) by mouth every 6 (six) hours as needed for nausea. 20 tablet 0   . OVER THE COUNTER MEDICATION Place 1 Dose into both eyes as needed (for eye dryness and irritation). [place] [place] emergeny eye cleanser   11/11/2015 at Unknown time  . sulfamethoxazole-trimethoprim (BACTRIM DS,SEPTRA DS) 800-160 MG tablet Take 1 tablet by mouth 2 (two) times daily. 10 tablet 0    Results for orders placed or performed during the hospital encounter of 06/11/16 (from the past 48 hour(s))  Urinalysis, Routine w reflex microscopic (not at [facility])     Status: Abnormal   Collection Time: 06/11/16  5:05 AM  Result Value Ref Range   Color, Urine YELLOW YELLOW   APPearance CLEAR CLEAR   Specific Gravity, Urine >1.030 (H) 1.005 - 1.030   pH 5.5 5.0 - 8.0   Glucose, UA NEGATIVE NEGATIVE mg/dL   Hgb urine dipstick NEGATIVE NEGATIVE   Bilirubin Urine NEGATIVE NEGATIVE   Ketones, ur NEGATIVE NEGATIVE mg/dL   Protein, ur NEGATIVE NEGATIVE mg/dL   Nitrite NEGATIVE NEGATIVE   Leukocytes, UA NEGATIVE NEGATIVE  Comment: MICROSCOPIC NOT DONE ON URINES WITH NEGATIVE PROTEIN, BLOOD, LEUKOCYTES, NITRITE, OR GLUCOSE <1000 mg/dL.  Pregnancy, urine POC     Status: None   Collection Time: 06/11/16  5:40 AM  Result Value Ref Range   Preg Test, Ur NEGATIVE NEGATIVE    Comment:        THE SENSITIVITY OF THIS METHODOLOGY IS >24 mIU/mL   Wet prep, genital     Status: Abnormal   Collection Time: 06/11/16  5:47 AM  Result Value Ref Range   Yeast Wet Prep HPF POC NONE SEEN NONE SEEN   Trich, Wet Prep NONE SEEN NONE SEEN   Clue Cells Wet Prep HPF POC PRESENT (A) NONE SEEN   WBC, Wet Prep HPF POC MANY (A) NONE SEEN    Comment: MANY BACTERIA SEEN   Sperm NONE SEEN     Review of Systems  Constitutional: Negative for chills and fever.  Gastrointestinal: Negative for abdominal pain, nausea and vomiting.  Genitourinary: Negative for dysuria, frequency and  urgency.  Psychiatric/Behavioral: Negative for memory loss.   Physical Exam   Blood pressure 138/84, pulse 100, temperature 98.4 F (36.9 C), temperature source Oral, weight 188 lb (85.3 kg), last menstrual period 06/02/2016.  Physical Exam  Constitutional: She is oriented to person, place, and time. She appears well-developed and well-nourished. No distress.  Genitourinary:  Genitourinary Comments: Vagina - Small amount of yellow vaginal discharge, mild odor  Cervix - + contact bleeding, no active bleeding  Bimanual exam: Cervix closed Uterus non tender, normal size Adnexa non tender, no masses bilaterally GC/Chlam, wet prep done Chaperone present for exam.   Musculoskeletal: Normal range of motion.  Neurological: She is alert and oriented to person, place, and time.  Skin: Skin is warm. She is not diaphoretic.  Psychiatric: Her behavior is normal.    MAU Course  Procedures  None  MDM  GC and Wet prep UA UPT negative  Rocephin 250 mg IM Azithromycin 1 gram  Assessment and Plan   A:  1. Cervicitis   2. Bacterial vaginosis   3. Vaginal irritation     P:  Discharge home in stable condition Rx: Flagyl - no alcohol Return to MAU for emergencies only GC pending    Lezlie Lye, NP 06/12/2016 12:04 PM

## 2016-06-11 NOTE — Discharge Instructions (Signed)
Bacterial Vaginosis Bacterial vaginosis is a vaginal infection that occurs when the normal balance of bacteria in the vagina is disrupted. It results from an overgrowth of certain bacteria. This is the most common vaginal infection in women of childbearing age. Treatment is important to prevent complications, especially in pregnant women, as it can cause a premature delivery. CAUSES  Bacterial vaginosis is caused by an increase in harmful bacteria that are normally present in smaller amounts in the vagina. Several different kinds of bacteria can cause bacterial vaginosis. However, the reason that the condition develops is not fully understood. RISK FACTORS Certain activities or behaviors can put you at an increased risk of developing bacterial vaginosis, including:  Having a new sex partner or multiple sex partners.  Douching.  Using an intrauterine device (IUD) for contraception. Women do not get bacterial vaginosis from toilet seats, bedding, swimming pools, or contact with objects around them. SIGNS AND SYMPTOMS  Some women with bacterial vaginosis have no signs or symptoms. Common symptoms include:  Grey vaginal discharge.  A fishlike odor with discharge, especially after sexual intercourse.  Itching or burning of the vagina and vulva.  Burning or pain with urination. DIAGNOSIS  Your health care provider will take a medical history and examine the vagina for signs of bacterial vaginosis. A sample of vaginal fluid may be taken. Your health care provider will look at this sample under a microscope to check for bacteria and abnormal cells. A vaginal pH test may also be done.  TREATMENT  Bacterial vaginosis may be treated with antibiotic medicines. These may be given in the form of a pill or a vaginal cream. A second round of antibiotics may be prescribed if the condition comes back after treatment. Because bacterial vaginosis increases your risk for sexually transmitted diseases, getting  treated can help reduce your risk for chlamydia, gonorrhea, HIV, and herpes. HOME CARE INSTRUCTIONS   Only take over-the-counter or prescription medicines as directed by your health care provider.  If antibiotic medicine was prescribed, take it as directed. Make sure you finish it even if you start to feel better.  Tell all sexual partners that you have a vaginal infection. They should see their health care provider and be treated if they have problems, such as a mild rash or itching.  During treatment, it is important that you follow these instructions:  Avoid sexual activity or use condoms correctly.  Do not douche.  Avoid alcohol as directed by your health care provider.  Avoid breastfeeding as directed by your health care provider. SEEK MEDICAL CARE IF:   Your symptoms are not improving after 3 days of treatment.  You have increased discharge or pain.  You have a fever. MAKE SURE YOU:   Understand these instructions.  Will watch your condition.  Will get help right away if you are not doing well or get worse. FOR MORE INFORMATION  Centers for Disease Control and Prevention, Division of STD Prevention: [employer]raud[employer] American Sexual Health Association (ASHA): www.ashastd.org    This information is not intended to replace advice given to you by your health care provider. Make sure you discuss any questions you have with your health care provider.   Document Released: 07/24/2005 Document Revised: 08/14/2014 Document Reviewed: 03/05/2013 Elsevier Interactive Patient Education 2016 Elsevier Inc.  Cervicitis Cervicitis is a soreness and swelling (inflammation) of the cervix. Your cervix is located at the bottom of your uterus. It opens up to the vagina. CAUSES   Sexually transmitted infections (STIs).  Allergic reaction.   Medicines or birth control devices that are put in the vagina.   Injury to the cervix.   Bacterial infections.  RISK FACTORS You are at  greater risk if you:  Have unprotected sexual intercourse.  Have sexual intercourse with many partners.  Began sexual intercourse at an early age.  Have a history of STIs. SYMPTOMS  There may be no symptoms. If symptoms occur, they may include:   Gray, white, yellow, or bad-smelling vaginal discharge.   Pain or itching of the area outside the vagina.   Painful sexual intercourse.   Lower abdominal or lower back pain, especially during intercourse.   Frequent urination.   Abnormal vaginal bleeding between periods, after sexual intercourse, or after menopause.   Pressure or a heavy feeling in the pelvis.  DIAGNOSIS  Diagnosis is made after a pelvic exam. Other tests may include:   Examination of any discharge under a microscope (wet prep).   A Pap test.  TREATMENT  Treatment will depend on the cause of cervicitis. If it is caused by an STI, both you and your partner will need to be treated. Antibiotic medicines will be given.  HOME CARE INSTRUCTIONS   Do not have sexual intercourse until your health care provider says it is okay.   Do not have sexual intercourse until your partner has been treated, if your cervicitis is caused by an STI.   Take your antibiotics as directed. Finish them even if you start to feel better.  SEEK MEDICAL CARE IF:  Your symptoms come back.   You have a fever.  MAKE SURE YOU:   Understand these instructions.  Will watch your condition.  Will get help right away if you are not doing well or get worse.   This information is not intended to replace advice given to you by your health care provider. Make sure you discuss any questions you have with your health care provider.   Document Released: 07/24/2005 Document Revised: 07/29/2013 Document Reviewed: 01/15/2013 Elsevier Interactive Patient Education [employer].

## 2016-06-11 NOTE — Progress Notes (Signed)
Noni Saupe NP notified

## 2016-06-12 LAB — GC/CHLAMYDIA PROBE AMP (~~LOC~~) NOT AT ARMC
CHLAMYDIA, DNA PROBE: NEGATIVE
NEISSERIA GONORRHEA: NEGATIVE

## 2017-01-20 ENCOUNTER — Inpatient Hospital Stay (HOSPITAL_COMMUNITY)
Admission: AD | Admit: 2017-01-20 | Discharge: 2017-01-20 | Disposition: A | Discharge status: Home / Self Care | Payer: 59 | Source: Ambulatory Visit | Attending: Obstetrics and Gynecology | Admitting: Obstetrics and Gynecology

## 2017-01-20 DIAGNOSIS — Z3202 Encounter for pregnancy test, result negative: Secondary | ICD-10-CM

## 2017-01-20 DIAGNOSIS — R300 Dysuria: Secondary | ICD-10-CM

## 2017-01-20 LAB — URINALYSIS, ROUTINE W REFLEX MICROSCOPIC
Bilirubin Urine: NEGATIVE
GLUCOSE, UA: NEGATIVE mg/dL
Hgb urine dipstick: NEGATIVE
KETONES UR: NEGATIVE mg/dL
LEUKOCYTES UA: NEGATIVE
NITRITE: NEGATIVE
PH: 5 (ref 5.0–8.0)
PROTEIN: NEGATIVE mg/dL
Specific Gravity, Urine: 1.023 (ref 1.005–1.030)

## 2017-01-20 LAB — POCT PREGNANCY, URINE: Preg Test, Ur: NEGATIVE

## 2017-01-20 MED ORDER — PHENAZOPYRIDINE HCL 200 MG PO TABS: 200.0000 mg | ORAL_TABLET | Freq: Three times a day (TID) | ORAL | 0 refills | Status: DC | PRN

## 2017-01-20 NOTE — MAU Provider Note (Signed)
History     CSN: [id number]  Arrival date and time: 01/20/17 1829   First Provider Initiated Contact with Patient 01/20/17 [phone]      Chief Complaint  Patient presents with  . Urinary Tract Infection   HPI Alexis Beck is a 41 y.o. [phone] non pregnant female who presents complaining of lower abdominal cramping and frequency with urination for 1 week. She rates the cramping a 4/10 and has not tried any medication for the pain. She states she hold her urine for long periods of time and worries that she has a urinary tract infection now. She denies any recent intercourse, denies vaginal bleeding or abnormal discharge. LMP 01/04/17.   OB History    Gravida Para Term Preterm AB Living   4 3 3   1 3    SAB TAB Ectopic Multiple Live Births     1            Past Medical History:  Diagnosis Date  . Anxiety   . Asthma   . Bipolar disorder ([place])   . Depression     Past Surgical History:  Procedure Laterality Date  . TUBAL LIGATION  2003    Family History  Problem Relation Age of Onset  . Drug abuse Mother   . Drug abuse Father   . Depression Brother   . Depression Maternal Grandmother   . Schizophrenia Cousin     Social History  Substance Use Topics  . Smoking status: Never Smoker  . Smokeless tobacco: Not on file  . Alcohol use No    Allergies:  Allergies  Allergen Reactions  . Shellfish Allergy Anaphylaxis and Hives    Prescriptions Prior to Admission  Medication Sig Dispense Refill Last Dose  . albuterol (PROVENTIL HFA;VENTOLIN HFA) 108 (90 Base) MCG/ACT inhaler Inhale 1-2 puffs into the lungs every 6 (six) hours as needed for wheezing or shortness of breath.   11/11/2015 at Unknown time  . fluconazole (DIFLUCAN) 150 MG tablet Take 1 tablet (150 mg total) by mouth daily. 1 tablet 0   . metoCLOPramide (REGLAN) 10 MG tablet Take 1 tablet (10 mg total) by mouth every 6 (six) hours as needed for nausea. 20 tablet 0   . metroNIDAZOLE (FLAGYL) 500 MG tablet Take 1  tablet (500 mg total) by mouth 2 (two) times daily. 14 tablet 0   . OVER THE COUNTER MEDICATION Place 1 Dose into both eyes as needed (for eye dryness and irritation). [employer] emergeny eye cleanser   11/11/2015 at Unknown time    Review of Systems  Constitutional: Negative.  Negative for fatigue and fever.  Respiratory: Negative.  Negative for shortness of breath.   Cardiovascular: Negative.  Negative for chest pain.  Gastrointestinal: Positive for abdominal pain.  Genitourinary: Positive for dysuria, frequency and urgency. Negative for vaginal bleeding and vaginal discharge.  Neurological: Negative.  Negative for headaches.   Physical Exam   Blood pressure (!) 144/74, pulse 91, temperature 98.2 F (36.8 C), resp. rate 18, weight 191 lb (86.6 kg), last menstrual period 01/04/2017.  Physical Exam  Nursing note and vitals reviewed. Constitutional: She is oriented to person, place, and time. She appears well-developed and well-nourished.  HENT:  Head: Normocephalic and atraumatic.  Eyes: Conjunctivae are normal. No scleral icterus.  Cardiovascular: Normal rate, regular rhythm and normal heart sounds.   Respiratory: Effort normal and breath sounds normal. No respiratory distress.  GI: Soft. There is no tenderness. There is no guarding.  Musculoskeletal: Normal range  of motion.  Neurological: She is alert and oriented to person, place, and time.  Skin: Skin is warm and dry.  Psychiatric: She has a normal mood and affect. Her behavior is normal. Judgment and thought content normal.    MAU Course  Procedures Results for orders placed or performed during the hospital encounter of 01/20/17 (from the past 24 hour(s))  Urinalysis, Routine w reflex microscopic     Status: None   Collection Time: 01/20/17  8:55 AM  Result Value Ref Range   Color, Urine YELLOW YELLOW   APPearance CLEAR CLEAR   Specific Gravity, Urine 1.023 1.005 - 1.030   pH 5.0 5.0 - 8.0   Glucose, UA NEGATIVE  NEGATIVE mg/dL   Hgb urine dipstick NEGATIVE NEGATIVE   Bilirubin Urine NEGATIVE NEGATIVE   Ketones, ur NEGATIVE NEGATIVE mg/dL   Protein, ur NEGATIVE NEGATIVE mg/dL   Nitrite NEGATIVE NEGATIVE   Leukocytes, UA NEGATIVE NEGATIVE  Pregnancy, urine POC     Status: None   Collection Time: 01/20/17  9:05 AM  Result Value Ref Range   Preg Test, Ur NEGATIVE NEGATIVE    MDM UA, UPT, urine culture Patient refused pelvic exam and refused testing for vaginal infections.   Assessment and Plan   1. Dysuria   2. Pregnancy examination or test, negative result    -Discharge patient home in stable condition.  -Prescription for pyridium 200mg  PO, TID for pain -Urine sent for culture and will call patient with results if treatment needed.  -Encouraged patient to empty bladder frequently, reduce caffeine intake and increase water consumption.  -Encouraged to return here or to other Urgent Care/ED if she develops worsening of symptoms, increase in pain, fever, or other concerning symptoms.   Len Blalock SNM 01/20/2017, 9:27 AM   I confirm that I have verified the information documented in the CNM student's note and that I have also personally performed the physical exam and all medical decision making activities.  Pt declines further testing or exam. Will send urine for culture & treat with pyridium for the time being. Encouraged patient to establish care with a PCP. Discussed reasons to return for care.   Jorje Guild, NP

## 2017-01-20 NOTE — MAU Note (Signed)
Pt presents to MAU with complaints of urine frequency for a week, lower abdominal pain. States she thinks she has a UTI

## 2017-01-20 NOTE — Discharge Instructions (Signed)

## 2017-01-21 LAB — URINE CULTURE: Culture: NO GROWTH

## 2017-10-05 HISTORY — PX: NECK SURGERY: SHX720

## 2017-10-17 ENCOUNTER — Encounter (HOSPITAL_COMMUNITY): Payer: Self-pay | Admitting: Emergency Medicine

## 2017-10-17 ENCOUNTER — Emergency Department (HOSPITAL_COMMUNITY)
Admission: EM | Admit: 2017-10-17 | Discharge: 2017-10-17 | Disposition: A | Discharge status: Home / Self Care | Payer: PRIVATE HEALTH INSURANCE | Attending: Emergency Medicine | Admitting: Emergency Medicine

## 2017-10-17 ENCOUNTER — Emergency Department: Payer: PRIVATE HEALTH INSURANCE

## 2017-10-17 ENCOUNTER — Encounter: Payer: Self-pay | Admitting: Emergency Medicine

## 2017-10-17 ENCOUNTER — Observation Stay
Admission: EM | Admit: 2017-10-17 | Discharge: 2017-10-18 | Payer: PRIVATE HEALTH INSURANCE | Attending: Internal Medicine | Admitting: Internal Medicine

## 2017-10-17 ENCOUNTER — Other Ambulatory Visit: Payer: Self-pay

## 2017-10-17 DIAGNOSIS — R062 Wheezing: Secondary | ICD-10-CM | POA: Diagnosis present

## 2017-10-17 DIAGNOSIS — S01511A Laceration without foreign body of lip, initial encounter: Secondary | ICD-10-CM | POA: Insufficient documentation

## 2017-10-17 DIAGNOSIS — M79642 Pain in left hand: Secondary | ICD-10-CM | POA: Diagnosis present

## 2017-10-17 DIAGNOSIS — Z6831 Body mass index (BMI) 31.0-31.9, adult: Secondary | ICD-10-CM | POA: Insufficient documentation

## 2017-10-17 DIAGNOSIS — R000 Tachycardia, unspecified: Secondary | ICD-10-CM | POA: Diagnosis not present

## 2017-10-17 DIAGNOSIS — R55 Syncope and collapse: Principal | ICD-10-CM | POA: Insufficient documentation

## 2017-10-17 DIAGNOSIS — M50321 Other cervical disc degeneration at C4-C5 level: Secondary | ICD-10-CM | POA: Diagnosis not present

## 2017-10-17 DIAGNOSIS — S0003XA Contusion of scalp, initial encounter: Secondary | ICD-10-CM | POA: Diagnosis not present

## 2017-10-17 DIAGNOSIS — M50222 Other cervical disc displacement at C5-C6 level: Secondary | ICD-10-CM | POA: Insufficient documentation

## 2017-10-17 DIAGNOSIS — Z79899 Other long term (current) drug therapy: Secondary | ICD-10-CM | POA: Insufficient documentation

## 2017-10-17 DIAGNOSIS — F319 Bipolar disorder, unspecified: Secondary | ICD-10-CM | POA: Insufficient documentation

## 2017-10-17 DIAGNOSIS — J45901 Unspecified asthma with (acute) exacerbation: Secondary | ICD-10-CM | POA: Insufficient documentation

## 2017-10-17 DIAGNOSIS — M79641 Pain in right hand: Secondary | ICD-10-CM

## 2017-10-17 DIAGNOSIS — W19XXXA Unspecified fall, initial encounter: Secondary | ICD-10-CM | POA: Diagnosis not present

## 2017-10-17 DIAGNOSIS — F410 Panic disorder [episodic paroxysmal anxiety] without agoraphobia: Secondary | ICD-10-CM | POA: Insufficient documentation

## 2017-10-17 DIAGNOSIS — E669 Obesity, unspecified: Secondary | ICD-10-CM | POA: Diagnosis not present

## 2017-10-17 DIAGNOSIS — J4521 Mild intermittent asthma with (acute) exacerbation: Secondary | ICD-10-CM | POA: Diagnosis not present

## 2017-10-17 DIAGNOSIS — E860 Dehydration: Secondary | ICD-10-CM | POA: Diagnosis not present

## 2017-10-17 DIAGNOSIS — J341 Cyst and mucocele of nose and nasal sinus: Secondary | ICD-10-CM | POA: Diagnosis not present

## 2017-10-17 DIAGNOSIS — Y9289 Other specified places as the place of occurrence of the external cause: Secondary | ICD-10-CM | POA: Diagnosis not present

## 2017-10-17 DIAGNOSIS — S36113A Laceration of liver, unspecified degree, initial encounter: Secondary | ICD-10-CM | POA: Diagnosis not present

## 2017-10-17 LAB — CBC WITH DIFFERENTIAL/PLATELET
BASOS ABS: 0 10*3/uL (ref 0–0.1)
Basophils Relative: 0 %
EOS ABS: 0 10*3/uL (ref 0–0.7)
EOS PCT: 0 %
HCT: 32.3 % — ABNORMAL LOW (ref 35.0–47.0)
Hemoglobin: 10.3 g/dL — ABNORMAL LOW (ref 12.0–16.0)
LYMPHS ABS: 0.3 10*3/uL — AB (ref 1.0–3.6)
Lymphocytes Relative: 8 %
MCH: 21.6 pg — AB (ref 26.0–34.0)
MCHC: 31.8 g/dL — ABNORMAL LOW (ref 32.0–36.0)
MCV: 68 fL — AB (ref 80.0–100.0)
MONO ABS: 0.1 10*3/uL — AB (ref 0.2–0.9)
Monocytes Relative: 1 %
Neutro Abs: 4.1 10*3/uL (ref 1.4–6.5)
Neutrophils Relative %: 91 %
PLATELETS: 231 10*3/uL (ref 150–440)
RBC: 4.75 MIL/uL (ref 3.80–5.20)
RDW: 19.5 % — AB (ref 11.5–14.5)
WBC: 4.5 10*3/uL (ref 3.6–11.0)

## 2017-10-17 LAB — BASIC METABOLIC PANEL
Anion gap: 10 (ref 5–15)
BUN: 11 mg/dL (ref 6–20)
CALCIUM: 9 mg/dL (ref 8.9–10.3)
CO2: 18 mmol/L — ABNORMAL LOW (ref 22–32)
Chloride: 107 mmol/L (ref 101–111)
Creatinine, Ser: 0.8 mg/dL (ref 0.44–1.00)
GFR calc Af Amer: >60 mL/min (ref >60)
GLUCOSE: 202 mg/dL — AB (ref 65–99)
Potassium: 4.3 mmol/L (ref 3.5–5.1)
SODIUM: 135 mmol/L (ref 135–145)

## 2017-10-17 LAB — TROPONIN I: Troponin I: <0.03 ng/mL (ref <0.03)

## 2017-10-17 MED ORDER — ALBUTEROL SULFATE HFA 108 (90 BASE) MCG/ACT IN AERS
1.0000 | INHALATION_SPRAY | Freq: Once | RESPIRATORY_TRACT | Status: AC
Start: 2017-10-17 — End: 2017-10-17
  Administered 2017-10-17: 1 via RESPIRATORY_TRACT
  Filled 2017-10-17: qty 6.7

## 2017-10-17 MED ORDER — PREDNISONE 10 MG PO TABS: 40.0000 mg | ORAL_TABLET | Freq: Every day | ORAL | 0 refills | Status: DC

## 2017-10-17 MED ORDER — LORAZEPAM 2 MG/ML IJ SOLN
1.0000 mg | Freq: Once | INTRAMUSCULAR | Status: AC
  Administered 2017-10-17: 1 mg via INTRAVENOUS
  Filled 2017-10-17: qty 1

## 2017-10-17 MED ORDER — CETIRIZINE HCL 10 MG PO TABS: 10.0000 mg | ORAL_TABLET | Freq: Every day | ORAL | 0 refills | Status: DC

## 2017-10-17 MED ORDER — IOPAMIDOL (ISOVUE-370) INJECTION 76%
100.0000 mL | Freq: Once | INTRAVENOUS | Status: AC | PRN
  Administered 2017-10-17: 100 mL via INTRAVENOUS

## 2017-10-17 MED ORDER — ALBUTEROL SULFATE (2.5 MG/3ML) 0.083% IN NEBU
5.0000 mg | INHALATION_SOLUTION | Freq: Once | RESPIRATORY_TRACT | Status: AC
  Administered 2017-10-17: 5 mg via RESPIRATORY_TRACT
  Filled 2017-10-17: qty 6

## 2017-10-17 MED ORDER — LIDOCAINE-EPINEPHRINE (PF) 1 %-1:200000 IJ SOLN
INTRAMUSCULAR | Status: AC
  Filled 2017-10-17: qty 30

## 2017-10-17 MED ORDER — KETOROLAC TROMETHAMINE 30 MG/ML IJ SOLN
30.0000 mg | Freq: Once | INTRAMUSCULAR | Status: AC
  Administered 2017-10-17: 30 mg via INTRAVENOUS
  Filled 2017-10-17: qty 1

## 2017-10-17 MED ORDER — FLUTICASONE PROPIONATE 50 MCG/ACT NA SUSP: 2.0000 | Freq: Every day | NASAL | 0 refills | Status: DC

## 2017-10-17 MED ORDER — MORPHINE SULFATE (PF) 4 MG/ML IV SOLN
4.0000 mg | Freq: Once | INTRAVENOUS | Status: AC
  Administered 2017-10-17: 4 mg via INTRAVENOUS
  Filled 2017-10-17: qty 1

## 2017-10-17 MED ORDER — ONDANSETRON HCL 4 MG/2ML IJ SOLN
4.0000 mg | Freq: Once | INTRAMUSCULAR | Status: AC
  Administered 2017-10-17: 4 mg via INTRAVENOUS
  Filled 2017-10-17: qty 2

## 2017-10-17 MED ORDER — PREDNISONE 20 MG PO TABS
60.0000 mg | ORAL_TABLET | Freq: Once | ORAL | Status: AC
  Administered 2017-10-17: 60 mg via ORAL
  Filled 2017-10-17: qty 3

## 2017-10-17 MED ORDER — SODIUM CHLORIDE 0.9 % IV BOLUS (SEPSIS)
1000.0000 mL | Freq: Once | INTRAVENOUS | Status: AC
  Administered 2017-10-17: 1000 mL via INTRAVENOUS

## 2017-10-17 NOTE — ED Triage Notes (Signed)
Patient reports wheezing x2 weeks. States she is out of her inhaler. Expiratory wheezing noted in triage. Speaking in full sentences without difficulty.

## 2017-10-17 NOTE — Discharge Instructions (Signed)
You were seen here today for symptoms related to your known Asthma.   Please follow-up with your doctor in regards to your asthma exacerbation in the next 3 days for further evaluation and management of your asthma. Read the instructions below to learn more about factors that can trigger asthma. Use your albuterol inhaler as directed. Your more than welcome to return to the emergency department if you become short of breath, your albuterol inhaler does not relieve symptoms, you are having chest pain associated with difficulty breathing, or any symptoms that are concerning to you.   Medications: Albuterol inhaler (provided in the department)- this medication will help open up your airway. Use inhaler as follows: 1-2 puffs with spacer every 4 hours as needed for wheezing, cough, or shortness of breath.  Prednisone -this medication will help prevent repeat exacerbation of her asthma.  Please note this medication can increase blood sugars if you are diabetic.  Please monitor them if you are.  Also note this medication can cause weight gain, increased hunger, agitation and poor sleep.  Zyrtec & Flonase-this is to help with allergies that may be exacerbating your asthma  IDENTIFY AND CONTROL FACTORS THAT MAKE YOUR ASTHMA WORSE A number of common things can set off or make your asthma symptoms worse (asthma triggers). Keep track of your asthma symptoms for several weeks, detailing all the environmental and emotional factors that are linked with your asthma. When you have an asthma attack, go back to your asthma diary to see which factor, or combination of factors, might have contributed to it. Once you know what these factors are, you can take steps to control many of them.  Allergies: If you have allergies and asthma, it is important to take asthma prevention steps at home. Asthma attacks (worsening of asthma symptoms) can be triggered by allergies, which can cause temporary increased inflammation of your  airways. Minimizing contact with the substance to which you are allergic will help prevent an asthma attack. Animal Dander:  Some people are allergic to the flakes of skin or dried saliva from animals with fur or feathers. Keep these pets out of your home.  If you can't keep a pet outdoors, keep the pet out of your bedroom and other sleeping areas at all times, and keep the door closed.  Remove carpets and furniture covered with cloth from your home. If that is not possible, keep the pet away from fabric-covered furniture and carpets.  Dust Mites: Many people with asthma are allergic to dust mites. Dust mites are tiny bugs that are found in every home, in mattresses, pillows, carpets, fabric-covered furniture, bedcovers, clothes, stuffed toys, fabric, and other fabric-covered items.  Cover your mattress in a special dust-proof cover.  Cover your pillow in a special dust-proof cover, or wash the pillow each week in hot water. Water must be hotter than 130 F to kill dust mites. Cold or warm water used with detergent and bleach can also be effective.  Wash the sheets and blankets on your bed each week in hot water.  Try not to sleep or lie on cloth-covered cushions.  Call ahead when traveling and ask for a smoke-free hotel room. Bring your own bedding and pillows, in case the hotel only supplies feather pillows and down comforters, which may contain dust mites and cause asthma symptoms.  Remove carpets from your bedroom and those laid on concrete, if you can.  Keep stuffed toys out of the bed, or wash the toys weekly in hot  water or cooler water with detergent and bleach.  Cockroaches: Many people with asthma are allergic to the droppings and remains of cockroaches.  Keep food and garbage in closed containers. Never leave food out.  Use poison baits, traps, powders, gels, or paste (for example, boric acid).  If a spray is used to kill cockroaches, stay out of the room until the odor goes away.    Indoor Mold: Fix leaky faucets, pipes, or other sources of water that have mold around them.  Clean moldy surfaces with a cleaner that has bleach in it.  Pollen and Outdoor Mold: When pollen or mold spore counts are high, try to keep your windows closed.  Stay indoors with windows closed from late morning to afternoon, if you can. Pollen and some mold spore counts are highest at that time.  Ask your caregiver whether you need to take or increase anti-inflammatory medicine before your allergy season starts.  Irritants:  Tobacco smoke is an irritant. If you smoke, ask your caregiver how you can quit. Ask family members to quit smoking, too. Do not allow smoking in your home or car.  If possible, do not use a wood-burning stove, kerosene heater, or fireplace. Minimize exposure to all sources of smoke, including incense, candles, fires, and fireworks.  Try to stay away from strong odors and sprays, such as perfume, talcum powder, hair spray, and paints.  Decrease humidity in your home and use an indoor air cleaning device. Reduce indoor humidity to below 60 percent. Dehumidifiers or central air conditioners can do this.  Try to have someone else vacuum for you once or twice a week, if you can. Stay out of rooms while they are being vacuumed and for a short while afterward.  If you vacuum, use a dust mask from a hardware store, a double-layered or microfilter vacuum cleaner bag, or a vacuum cleaner with a HEPA filter.  Sulfites in foods and beverages can be irritants. Do not drink beer or wine, or eat dried fruit, processed potatoes, or shrimp if they cause asthma symptoms.  Cold air can trigger an asthma attack. Cover your nose and mouth with a scarf on cold or windy days.  Several health conditions can make asthma more difficult to manage, including runny nose, sinus infections, reflux disease, psychological stress, and sleep apnea. Your caregiver will treat these conditions, as well.  Avoid close  contact with people who have a cold or the flu, since your asthma symptoms may get worse if you catch the infection from them. Wash your hands thoroughly after touching items that may have been handled by people with a respiratory infection.  Get a flu shot every year to protect against the flu virus, which often makes asthma worse for days or weeks. Also get a pneumonia shot once every five to 10 years.  Drugs: Aspirin and other painkillers can cause asthma attacks. 10% to 20% of people with asthma have sensitivity to aspirin or a group of painkillers called non-steroidal anti-inflammatory drugs (NSAIDS), such as ibuprofen and naproxen. These drugs are used to treat pain and reduce fevers. Asthma attacks caused by any of these medicines can be severe and even fatal. These drugs must be avoided in people who have known aspirin sensitive asthma. Products with acetaminophen are considered safe for people who have asthma. It is important that people with aspirin sensitivity read labels of all over-the-counter drugs used to treat pain, colds, coughs, and fever.  Beta blockers and ACE inhibitors are other drugs  which you should discuss with your caregiver, in relation to your asthma.   EXERCISE  If you have exercise-induced asthma, or are planning vigorous exercise, or exercise in cold, humid, or dry environments, prevent exercise-induced asthma by following your caregiver's advice regarding asthma treatment before exercising. Additional Information:  Your vital signs today were: BP 118/80 (BP Location: Right Arm)    Pulse 88    Temp 99.4 F (37.4 C) (Oral)    Ht 5\' 5"  (1.651 m)    Wt 86.2 kg (190 lb)    LMP 09/26/2017    SpO2 100%    BMI 31.62 kg/m  If your blood pressure (BP) was elevated above 135/85 this visit, please have this repeated by your doctor within one month. ---------------

## 2017-10-17 NOTE — ED Provider Notes (Signed)
[facility] DEPT Provider Note   CSN: [id number] Arrival date & time: 10/17/17  1045     History   Chief Complaint Chief Complaint  Patient presents with  . Wheezing    HPI Alexis Beck is a 42 y.o. female with a history of asthma presenting to the emergency department today for asthma exacerbation.  Patient states that she recently started a new job 2 weeks ago.  She notes that her coworker wears a lavender scented lotion that caused her to have itchy/watery eyes, rhinorrhea and sneezing.  She notes that since that time she feels she typically has been having episodes of wheezing, shortness of breath and chest tightness intermittently that she has required albuterol rescue inhaler treatment for while at work.  She notes that a few days ago she misplaced her albuterol inhaler and has been without it since.  She notes that she had a flare of her asthma this morning including her typical symptoms of wheezing, shortness of breath and chest tightness.  She was noted to have expiratory wheezes in triage and received 1 albuterol nebulizer that provided her relief of her symptoms.  She is currently symptom-free.  She denies any fever, chills, body aches, sore throat, neck stiffness, chest pain, cough, hemoptysis, lower leg swelling.  She is a never smoker.  She denies history of intubations or admissions for her asthma.  HPI  Past Medical History:  Diagnosis Date  . Anxiety   . Asthma   . Bipolar disorder ([place])   . Depression     Patient Active Problem List   Diagnosis Date Noted  . 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 Asthma 03/08/2013  . Depression 02/12/2012  . Anxiety 02/12/2012    Past Surgical History:  Procedure Laterality Date  . TUBAL LIGATION  2003    OB History    Gravida Para Term Preterm AB Living   4 3 3   1 3    SAB TAB Ectopic Multiple Live Births     1             Home Medications    Prior to Admission medications   Medication Sig  Start Date End Date Taking? Authorizing Provider  albuterol (PROVENTIL HFA;VENTOLIN HFA) 108 (90 Base) MCG/ACT inhaler Inhale 1-2 puffs into the lungs every 6 (six) hours as needed for wheezing or shortness of breath.   Yes [provider]  Naproxen Sodium (ALEVE) 220 MG CAPS Take 440 mg by mouth 2 (two) times daily as needed (PAIN).   Yes [provider]  phenazopyridine (PYRIDIUM) 200 MG tablet Take 1 tablet (200 mg total) by mouth 3 (three) times daily as needed for pain. Patient not taking: Reported on 10/17/2017 01/20/17   Jorje Guild, NP    Family History Family History  Problem Relation Age of Onset  . Drug abuse Mother   . Drug abuse Father   . Depression Brother   . Depression Maternal Grandmother   . Schizophrenia Cousin     Social History Social History   Tobacco Use  . Smoking status: Never Smoker  Substance Use Topics  . Alcohol use: No  . Drug use: No     Allergies   Shellfish allergy   Review of Systems Review of Systems  All other systems reviewed and are negative.    Physical Exam Updated Vital Signs BP 118/80 (BP Location: Right Arm)   Pulse 88   Temp 99.4 F (37.4 C) (Oral)   Ht 5\' 5"  (1.651  m)   Wt 86.2 kg (190 lb)   LMP 09/26/2017   SpO2 100%   BMI 31.62 kg/m   Physical Exam  Constitutional: She appears well-developed and well-nourished.  HENT:  Head: Normocephalic and atraumatic.  Right Ear: External ear normal.  Left Ear: External ear normal.  Nose: Nose normal.  Mouth/Throat: Uvula is midline, oropharynx is clear and moist and mucous membranes are normal. No tonsillar exudate.  Eyes: Pupils are equal, round, and reactive to light. Right eye exhibits no discharge. Left eye exhibits no discharge. No scleral icterus.  Neck: Trachea normal. Neck supple. No JVD present. No spinous process tenderness present. Carotid bruit is not present. No neck rigidity. Normal range of motion present.  Cardiovascular: Normal rate,  regular rhythm and intact distal pulses.  No murmur heard. Pulses:      Radial pulses are 2+ on the right side, and 2+ on the left side.       Dorsalis pedis pulses are 2+ on the right side, and 2+ on the left side.       Posterior tibial pulses are 2+ on the right side, and 2+ on the left side.  No lower extremity swelling or edema. Calves symmetric in size bilaterally.  Pulmonary/Chest: Effort normal and breath sounds normal. No accessory muscle usage. No tachypnea. No respiratory distress. She has no decreased breath sounds. She has no wheezes. She has no rhonchi. She exhibits no tenderness.  No increased work of breathing. No accessory muscle use. Patient is sitting upright, speaking in full sentences without difficulty   Abdominal: Soft. Bowel sounds are normal. There is no tenderness. There is no rebound and no guarding.  Musculoskeletal: She exhibits no edema.  Lymphadenopathy:    She has no cervical adenopathy.  Neurological: She is alert.  Skin: Skin is warm, dry and intact. No petechiae, no purpura and no rash noted. She is not diaphoretic. No erythema. No pallor.  Psychiatric: She has a normal mood and affect.  Nursing note and vitals reviewed.    ED Treatments / Results  Labs (all labs ordered are listed, but only abnormal results are displayed) Labs Reviewed - No data to display  EKG  EKG Interpretation None       Radiology No results found.  Procedures Procedures (including critical care time)  Medications Ordered in ED Medications  albuterol (PROVENTIL HFA;VENTOLIN HFA) 108 (90 Base) MCG/ACT inhaler 1 puff (not administered)  predniSONE (DELTASONE) tablet 60 mg (not administered)  albuterol (PROVENTIL) (2.5 MG/3ML) 0.083% nebulizer solution 5 mg (5 mg Nebulization Given 10/17/17 1136)     Initial Impression / Assessment and Plan / ED Course  I have reviewed the triage vital signs and the nursing notes.  Pertinent labs & imaging results that were  available during my care of the patient were reviewed by me and considered in my medical decision making (see chart for details).     42 y.o. female with a history of asthma presented to the emergency department today for asthma exacerbation.  She notes her symptoms are typical of asthma exacerbation including wheezing, shortness of breath and chest tightness.  No chest pain.  She was reported to have wheezing that was expiratory while in triage.  She received a nebulizer treatment that provided her relief.  On my exam the patient is now symptomatic free.  She is currently satting at 100% on room air in no respiratory distress and lungs are clear to auscultation bilaterally.  Patient is without infectious symptoms.  She does report symptoms that are likely related to allergies that may be precipitating her symptoms for which I will start her on Zyrtec and Flonase.  Patient given first dose of prednisone in the department and will be started on a 5-day taper.  Albuterol inhaler provided.  Patient does not have a PCP.  Provide information on this as well as referral to wellness center. I advised the patient to find follow-up with PCP this week. Specific return precautions discussed. Time was given for all questions to be answered. The patient verbalized understanding and agreement with plan. The patient appears safe for discharge home.  Final Clinical Impressions(s) / ED Diagnoses   Final diagnoses:  Mild intermittent asthma with exacerbation    ED Discharge Orders        Ordered    cetirizine (ZYRTEC) 10 MG tablet  Daily     10/17/17 1214    fluticasone (FLONASE) 50 MCG/ACT nasal spray  Daily     10/17/17 1214    predniSONE (DELTASONE) 10 MG tablet  Daily with breakfast     10/17/17 1214       Lorelle Gibbs 10/17/17 1220    Fatima Blank, MD 10/20/17 [phone]

## 2017-10-17 NOTE — ED Provider Notes (Signed)
[facility] Emergency Department Provider Note  ___________________________________________   First MD Initiated Contact with Patient 10/17/17 2047     (approximate)  I have reviewed the triage vital signs and the nursing notes.   HISTORY  Chief Complaint Fall   HPI Alexis Beck is a 42 y.o. female with a history of bipolar disorder as well as anxiety and depression who is presenting to the emergency department today after syncopal episode.  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  She is unsure how long she was unconscious but she did sustain trauma to her upper lip.  Says that she has had her last tetanus shot within the last 5-6 years.  EMS thought that it appeared that the patient lost bladder continence but the patient says that her pants are dry.  No chest pain or palpitations prior to the episode with the patient does state that she was seen for asthma today at [facility] was given a dose of steroids and inhaler which improved her symptoms.  She says that she has been having intermittent shortness of breath as well as chest pressure over the past 2 weeks.  She says that she had fallen on her hands, bilaterally and has pain to her bilateral hands "all over"that feels like "bees are stinging her."  Says that she had a previous episode of syncope about 10-15 years ago when she felt very anxious and had a panic attack.  However, she says that she did not feel very anxious prior to passing out this instance.  Denying any chest pain or shortness of breath this time.  Brought in by EMS reported her heart rate to be about 120.  Past Medical History:  Diagnosis Date  . Anxiety   . Asthma   . Bipolar disorder ([place])   . Depression     Patient Active Problem List   Diagnosis Date Noted  . 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 Asthma 03/08/2013  . Depression 02/12/2012  . Anxiety 02/12/2012      Past Surgical History:  Procedure Laterality Date  . TUBAL LIGATION  2003    Prior to Admission medications   Medication Sig Start Date End Date Taking? Authorizing Provider  albuterol (PROVENTIL HFA;VENTOLIN HFA) 108 (90 Base) MCG/ACT inhaler Inhale 1-2 puffs into the lungs every 6 (six) hours as needed for wheezing or shortness of breath.    [provider]  cetirizine (ZYRTEC) 10 MG tablet Take 1 tablet (10 mg total) by mouth daily. 10/17/17   Maczis, Barth Kirks, PA-C  fluticasone (FLONASE) 50 MCG/ACT nasal spray Place 2 sprays into both nostrils daily. 10/17/17   Maczis, Barth Kirks, PA-C  Naproxen Sodium (ALEVE) 220 MG CAPS Take 440 mg by mouth 2 (two) times daily as needed (PAIN).    [provider]  phenazopyridine (PYRIDIUM) 200 MG tablet Take 1 tablet (200 mg total) by mouth 3 (three) times daily as needed for pain. Patient not taking: Reported on 10/17/2017 01/20/17   Jorje Guild, NP  predniSONE (DELTASONE) 10 MG tablet Take 4 tablets (40 mg total) by mouth daily with breakfast. 10/17/17   Maczis, Barth Kirks, PA-C    Allergies Shellfish allergy  Family History  Problem Relation Age of Onset  . Drug abuse Mother   . Drug abuse Father   . Depression Brother   . Depression Maternal Grandmother   . Schizophrenia Cousin  Social History Social History   Tobacco Use  . Smoking status: Never Smoker  . Smokeless tobacco: Never Used  Substance Use Topics  . Alcohol use: No  . Drug use: No    Review of Systems  Constitutional: No fever/chills Eyes: No visual changes. ENT: No sore throat. Cardiovascular: As above Respiratory: As above  gastrointestinal: No abdominal pain.  No nausea, no vomiting.  No diarrhea.  No constipation. Genitourinary: Negative for dysuria. Musculoskeletal: Negative for back pain. Skin: Negative for rash. Neurological: Negative for headaches, focal weakness or  numbness.   ____________________________________________   PHYSICAL EXAM:  VITAL SIGNS: ED Triage Vitals  Enc Vitals Group     BP 10/17/17 2041 (!) 150/78     Pulse Rate 10/17/17 2041 (!) 106     Resp 10/17/17 2041 (!) 26     Temp 10/17/17 2041 98.7 F (37.1 C)     Temp Source 10/17/17 2041 Oral     SpO2 10/17/17 2041 100 %     Weight 10/17/17 2046 190 lb (86.2 kg)     Height 10/17/17 2046 5\' 5"  (1.651 m)     Head Circumference --      Peak Flow --      Pain Score 10/17/17 2044 8     Pain Loc --      Pain Edu? --      Excl. in [place]? --     Constitutional: Alert and oriented. Well appearing and in no acute distress. Eyes: Conjunctivae are normal.  Head: 1-1/2 cm, oblique laceration that is about 2-3 mm deep and just runs through the vermilion border.  No active bleeding at this time. Nose: No congestion/rhinnorhea. Mouth/Throat: Mucous membranes are moist.  Neck: No stridor.  No tenderness to palpation to the cervical spine.  No deformity or step-off. Cardiovascular: Normal rate, regular rhythm. Grossly normal heart sounds.  Good peripheral circulation with equal and bilateral radial as well as dorsalis pedis pulses. Respiratory: Normal respiratory effort.  No retractions. Lungs CTAB. Gastrointestinal: Soft and nontender. No distention. No CVA tenderness. Musculoskeletal: No lower extremity tenderness nor edema.  No joint effusions.  Patient holding her hands up in the air and is resistant to moving them secondary to pain.  She is tenderness to palpation to the left hand over the first metacarpal without deformity.  Hands are bilaterally neurovascularly intact with brisk capillary refill.  The right hand is tender diffusely.  However, there are no deformities the bilateral hands.  Small abrasion to the dorsum of the right hand without active bleeding.  Neurologic:  Normal speech and language. No gross focal neurologic deficits are appreciated. Skin:  Skin is warm, dry and  intact. No rash noted. Psychiatric: Mood and affect are normal. Speech and behavior are normal.  ____________________________________________   LABS (all labs ordered are listed, but only abnormal results are displayed)  Labs Reviewed  CBC WITH DIFFERENTIAL/PLATELET - Abnormal; Notable for the following components:      Result Value   Hemoglobin 10.3 (*)    HCT 32.3 (*)    MCV 68.0 (*)    MCH 21.6 (*)    MCHC 31.8 (*)    RDW 19.5 (*)    Lymphs Abs 0.3 (*)    Monocytes Absolute 0.1 (*)    All other components within normal limits  BASIC METABOLIC PANEL - Abnormal; Notable for the following components:   CO2 18 (*)    Glucose, Bld 202 (*)    All other components within  normal limits  TROPONIN I  TROPONIN I   ____________________________________________  EKG  ED ECG REPORT I, Doran Stabler, the attending physician, personally viewed and interpreted this ECG.   Date: 10/17/2017  EKG Time: 2126  Rate: 104  Rhythm: Sinus tachycardia  Axis: Normal  Intervals:none  ST&T Change: No ST segment elevation or depression.  No abnormal T wave inversion.  ____________________________________________  RADIOLOGY  No acute finding on the head CT nor the chest CT.  Bilateral hand x-rays as well as the right wrist x-ray without acute findings ____________________________________________   PROCEDURES  Procedure(s) performed:   Marland KitchenMarland KitchenLaceration Repair Date/Time: 10/18/2017 12:32 AM Performed by: Orbie Pyo, MD Authorized by: Orbie Pyo, MD   Consent:    Consent obtained:  Verbal   Consent given by:  Patient   Risks discussed:  Pain, poor cosmetic result, need for additional repair and poor wound healing   Alternatives discussed:  No treatment Anesthesia (see MAR for exact dosages):    Anesthesia method:  Local infiltration   Local anesthetic:  Lidocaine 1% WITH epi Laceration details:    Location:  Lip   Lip location:  Upper exterior lip    Length (cm):  1.5   Depth (mm):  3 Pre-procedure details:    Preparation:  Patient was prepped and draped in usual sterile fashion Exploration:    Hemostasis achieved with:  Epinephrine and direct pressure   Wound exploration: wound explored through full range of motion     Contaminated: no   Treatment:    Area cleansed with:  Saline   Amount of cleaning:  Standard   Irrigation solution:  Sterile saline   Irrigation volume:  119ml   Irrigation method:  Pressure wash and syringe Skin repair:    Repair method:  Sutures   Suture size:  6-0   Suture material:  Nylon   Suture technique:  Simple interrupted   Number of sutures:  4 Approximation:    Approximation:  Close   Vermilion border: well-aligned   Post-procedure details:    Patient tolerance of procedure:  Tolerated well, no immediate complications Comments:     Patient with good cosmesis.      Critical Care performed:   ____________________________________________   INITIAL IMPRESSION / ASSESSMENT AND PLAN / ED COURSE  Pertinent labs & imaging results that were available during my care of the patient were reviewed by me and considered in my medical decision making (see chart for details).  DDX: Syncope, seizure, anxiety, panic attack, laceration, PE, neuropathy As part of my medical decision making, I reviewed the following data within the [place] Notes from prior ED visits  ----------------------------------------- 12:37 AM on 10/18/2017 -----------------------------------------  Patient's laceration was repaired.  Pending second troponin as well as results of Neurontin.  Syncope likely.  Patient with preceding symptoms of dizziness.  No PE found on the CT angiography.  No seizure activity reported and patient says that she did not lose urinary continence.  She knows that she must have the sutures removed in 5 days.     ____________________________________________   FINAL CLINICAL  IMPRESSION(S) / ED DIAGNOSES  Syncope.  Bilateral hand pain.  Facial laceration.    NEW MEDICATIONS STARTED DURING THIS VISIT:  New Prescriptions   No medications on file     Note:  This document was prepared using Dragon voice recognition software and may include unintentional dictation errors.     Orbie Pyo, MD 10/18/17 [phone]

## 2017-10-17 NOTE — ED Triage Notes (Signed)
Pt to ED via EMS after fall today, EMS states 15 sec LOC per bystanders, unsure if mechanical fall, presents with lip lac, teeth intact, bilateral hand tingling and throbbing.  Hx asthma and anxiety, EMS vitals 150/100 BP, 113/130 HR.  Presents A&Ox4, speaking in complete and coherent sentences, cap refill <3 sec bilateral fingers, (+) movement to fingers

## 2017-10-17 NOTE — ED Notes (Signed)
Bed: WTR6 Expected date:  Expected time:  Means of arrival:  Comments: 

## 2017-10-18 ENCOUNTER — Observation Stay (HOSPITAL_BASED_OUTPATIENT_CLINIC_OR_DEPARTMENT_OTHER)
Admit: 2017-10-18 | Discharge: 2017-10-18 | Disposition: A | Discharge status: Home / Self Care | Payer: PRIVATE HEALTH INSURANCE | Attending: Family Medicine | Admitting: Family Medicine

## 2017-10-18 ENCOUNTER — Observation Stay (HOSPITAL_BASED_OUTPATIENT_CLINIC_OR_DEPARTMENT_OTHER): Payer: PRIVATE HEALTH INSURANCE

## 2017-10-18 ENCOUNTER — Observation Stay: Payer: PRIVATE HEALTH INSURANCE

## 2017-10-18 ENCOUNTER — Emergency Department: Payer: PRIVATE HEALTH INSURANCE

## 2017-10-18 DIAGNOSIS — R55 Syncope and collapse: Secondary | ICD-10-CM | POA: Diagnosis not present

## 2017-10-18 DIAGNOSIS — M79641 Pain in right hand: Secondary | ICD-10-CM

## 2017-10-18 DIAGNOSIS — I340 Nonrheumatic mitral (valve) insufficiency: Secondary | ICD-10-CM | POA: Diagnosis not present

## 2017-10-18 DIAGNOSIS — M79642 Pain in left hand: Secondary | ICD-10-CM

## 2017-10-18 LAB — CREATININE, SERUM
Creatinine, Ser: 0.73 mg/dL (ref 0.44–1.00)
GFR calc non Af Amer: >60 mL/min (ref >60)

## 2017-10-18 LAB — URINE DRUG SCREEN, QUALITATIVE (ARMC ONLY)
Amphetamines, Ur Screen: NOT DETECTED
Barbiturates, Ur Screen: NOT DETECTED
Benzodiazepine, Ur Scrn: NOT DETECTED
CANNABINOID 50 NG, UR ~~LOC~~: NOT DETECTED
COCAINE METABOLITE, UR ~~LOC~~: NOT DETECTED
MDMA (ECSTASY) UR SCREEN: NOT DETECTED
Methadone Scn, Ur: NOT DETECTED
Opiate, Ur Screen: POSITIVE — AB
PHENCYCLIDINE (PCP) UR S: NOT DETECTED
Tricyclic, Ur Screen: NOT DETECTED

## 2017-10-18 LAB — HEMOGLOBIN A1C
HEMOGLOBIN A1C: 6.4 % — AB (ref 4.8–5.6)
MEAN PLASMA GLUCOSE: 136.98 mg/dL

## 2017-10-18 LAB — CBC
HEMATOCRIT: 30.1 % — AB (ref 35.0–47.0)
HEMOGLOBIN: 9.3 g/dL — AB (ref 12.0–16.0)
MCH: 21.1 pg — AB (ref 26.0–34.0)
MCHC: 31.1 g/dL — ABNORMAL LOW (ref 32.0–36.0)
MCV: 68 fL — AB (ref 80.0–100.0)
PLATELETS: 210 10*3/uL (ref 150–440)
RBC: 4.42 MIL/uL (ref 3.80–5.20)
RDW: 19 % — ABNORMAL HIGH (ref 11.5–14.5)
WBC: 8.5 10*3/uL (ref 3.6–11.0)

## 2017-10-18 LAB — CK: Total CK: 280 U/L — ABNORMAL HIGH (ref 38–234)

## 2017-10-18 LAB — RAPID HIV SCREEN (HIV 1/2 AB+AG)
HIV 1/2 ANTIBODIES: NONREACTIVE
HIV-1 P24 ANTIGEN - HIV24: NONREACTIVE

## 2017-10-18 LAB — VITAMIN B12: Vitamin B-12: 918 pg/mL — ABNORMAL HIGH (ref 180–914)

## 2017-10-18 LAB — SEDIMENTATION RATE: Sed Rate: 17 mm/hr (ref 0–20)

## 2017-10-18 LAB — TROPONIN I: Troponin I: <0.03 ng/mL (ref <0.03)

## 2017-10-18 LAB — ECHOCARDIOGRAM COMPLETE
HEIGHTINCHES: 65 in
WEIGHTICAEL: 3040 [oz_av]

## 2017-10-18 LAB — C-REACTIVE PROTEIN: CRP: <0.8 mg/dL (ref <1.0)

## 2017-10-18 MED ORDER — ACETAMINOPHEN 325 MG PO TABS: 650.0000 mg | ORAL_TABLET | Freq: Four times a day (QID) | ORAL | Status: DC | PRN

## 2017-10-18 MED ORDER — ONDANSETRON HCL 4 MG/2ML IJ SOLN: 4.0000 mg | Freq: Four times a day (QID) | INTRAMUSCULAR | Status: DC | PRN

## 2017-10-18 MED ORDER — LIDOCAINE-EPINEPHRINE (PF) 2 %-1:200000 IJ SOLN
10.0000 mL | Freq: Once | INTRAMUSCULAR | Status: AC
  Administered 2017-10-17: 10 mL via INTRADERMAL

## 2017-10-18 MED ORDER — HYDROCODONE-ACETAMINOPHEN 5-325 MG PO TABS
1.0000 | ORAL_TABLET | ORAL | Status: DC | PRN
  Administered 2017-10-18 (×4): 2 via ORAL
  Filled 2017-10-18 (×4): qty 2

## 2017-10-18 MED ORDER — HYDROXYZINE HCL 10 MG PO TABS
10.0000 mg | ORAL_TABLET | Freq: Three times a day (TID) | ORAL | Status: DC | PRN
  Administered 2017-10-18: 10 mg via ORAL
  Filled 2017-10-18 (×3): qty 1

## 2017-10-18 MED ORDER — ENOXAPARIN SODIUM 40 MG/0.4ML ~~LOC~~ SOLN
40.0000 mg | SUBCUTANEOUS | Status: DC
  Administered 2017-10-18: 40 mg via SUBCUTANEOUS
  Filled 2017-10-18: qty 0.4

## 2017-10-18 MED ORDER — HYDROCODONE-ACETAMINOPHEN 5-325 MG PO TABS: 1.0000 | ORAL_TABLET | ORAL | Status: DC | PRN

## 2017-10-18 MED ORDER — MORPHINE SULFATE (PF) 4 MG/ML IV SOLN
INTRAVENOUS | Status: AC
  Administered 2017-10-18: 4 mg via INTRAVENOUS
  Filled 2017-10-18: qty 1

## 2017-10-18 MED ORDER — IPRATROPIUM-ALBUTEROL 0.5-2.5 (3) MG/3ML IN SOLN: 3.0000 mL | Freq: Four times a day (QID) | RESPIRATORY_TRACT | Status: DC | PRN

## 2017-10-18 MED ORDER — SODIUM CHLORIDE 0.9 % IV SOLN
INTRAVENOUS | Status: DC
  Administered 2017-10-18: 06:00:00 via INTRAVENOUS

## 2017-10-18 MED ORDER — FLUTICASONE PROPIONATE 50 MCG/ACT NA SUSP
2.0000 | Freq: Every day | NASAL | Status: DC
  Administered 2017-10-18: 2 via NASAL
  Filled 2017-10-18 (×2): qty 16

## 2017-10-18 MED ORDER — ACETAMINOPHEN 650 MG RE SUPP: 650.0000 mg | Freq: Four times a day (QID) | RECTAL | Status: DC | PRN

## 2017-10-18 MED ORDER — ALBUTEROL SULFATE (2.5 MG/3ML) 0.083% IN NEBU: 2.5000 mg | INHALATION_SOLUTION | Freq: Four times a day (QID) | RESPIRATORY_TRACT | Status: DC | PRN

## 2017-10-18 MED ORDER — GABAPENTIN 300 MG PO CAPS
300.0000 mg | ORAL_CAPSULE | Freq: Once | ORAL | Status: AC
  Administered 2017-10-18: 300 mg via ORAL
  Filled 2017-10-18: qty 1

## 2017-10-18 MED ORDER — PREDNISONE 20 MG PO TABS
40.0000 mg | ORAL_TABLET | Freq: Every day | ORAL | Status: DC
  Administered 2017-10-18: 40 mg via ORAL
  Filled 2017-10-18: qty 2

## 2017-10-18 MED ORDER — DOCUSATE SODIUM 100 MG PO CAPS
100.0000 mg | ORAL_CAPSULE | Freq: Two times a day (BID) | ORAL | Status: DC
  Administered 2017-10-18 (×2): 100 mg via ORAL
  Filled 2017-10-18 (×2): qty 1

## 2017-10-18 MED ORDER — ONDANSETRON HCL 4 MG PO TABS: 4.0000 mg | ORAL_TABLET | Freq: Four times a day (QID) | ORAL | Status: DC | PRN

## 2017-10-18 MED ORDER — MORPHINE SULFATE (PF) 4 MG/ML IV SOLN
4.0000 mg | Freq: Once | INTRAVENOUS | Status: AC
  Administered 2017-10-18: 4 mg via INTRAVENOUS

## 2017-10-18 MED ORDER — IBUPROFEN 100 MG/5ML PO SUSP
400.0000 mg | Freq: Four times a day (QID) | ORAL | Status: DC | PRN
  Filled 2017-10-18: qty 20

## 2017-10-18 MED ORDER — POLYETHYLENE GLYCOL 3350 17 G PO PACK: 17.0000 g | PACK | Freq: Every day | ORAL | Status: DC | PRN

## 2017-10-18 MED ORDER — LORATADINE 10 MG PO TABS
10.0000 mg | ORAL_TABLET | Freq: Every day | ORAL | Status: DC
  Administered 2017-10-18: 10 mg via ORAL
  Filled 2017-10-18: qty 1

## 2017-10-18 NOTE — ED Notes (Signed)
Dr. Owens Shark at bedside discussing admission with patient

## 2017-10-18 NOTE — Consult Note (Signed)
Reason for Consult:Syncope Referring Physician: Posey Pronto  CC: Syncope  HPI: Alexis Beck is an 42 y.o. female with a history of asthma who reports her asthma has been giving her trouble for the past few days.  Was dizzy and wheezing on yesterday.  Went to the ED and had breathing treatments.  Then went to work.  Got dizzy again at work and the next thing she knows she was being aroused on the floor.  Reports she was told that she urinated on herself but her clothes were not wet.  Did not bite her tongue but did hit her lip and loosen a tooth.  Has had dizzy episodes in the past and a syncopal episode related to her pain attacks but reports this was different.     Past Medical History:  Diagnosis Date  . Anxiety   . Asthma   . Bipolar disorder ([place])   . Depression     Past Surgical History:  Procedure Laterality Date  . TUBAL LIGATION  2003    Family History  Problem Relation Age of Onset  . Drug abuse Mother   . Drug abuse Father   . Depression Brother   . Depression Maternal Grandmother   . Schizophrenia Cousin     Social History:  reports that  has never smoked. she has never used smokeless tobacco. She reports that she does not drink alcohol or use drugs.  Allergies  Allergen Reactions  . Shellfish Allergy Anaphylaxis and Hives    Medications:  I have reviewed the patient's current medications. Prior to Admission:  Medications Prior to Admission  Medication Sig Dispense Refill Last Dose  . albuterol (PROVENTIL HFA;VENTOLIN HFA) 108 (90 Base) MCG/ACT inhaler Inhale 1-2 puffs into the lungs every 6 (six) hours as needed for wheezing or shortness of breath.   prn at prn  . cetirizine (ZYRTEC) 10 MG tablet Take 1 tablet (10 mg total) by mouth daily. 30 tablet 0 unknown at unknown  . fluticasone (FLONASE) 50 MCG/ACT nasal spray Place 2 sprays into both nostrils daily. 16 g 0 unknown at unknownunknown  . Naproxen Sodium (ALEVE) 220 MG CAPS Take 440 mg by mouth 2 (two) times  daily as needed (PAIN).   prn at prn  . predniSONE (DELTASONE) 10 MG tablet Take 4 tablets (40 mg total) by mouth daily with breakfast. 20 tablet 0 unknown at unknown   Scheduled: . docusate sodium  100 mg Oral BID  . enoxaparin (LOVENOX) injection  40 mg Subcutaneous Q24H  . fluticasone  2 spray Each Nare Daily  . loratadine  10 mg Oral Daily  . predniSONE  40 mg Oral Q breakfast    ROS: History obtained from the patient  General ROS: negative for - chills, fatigue, fever, night sweats, weight gain or weight loss Psychological ROS: increased stressors Ophthalmic ROS: negative for - blurry vision, double vision, eye pain or loss of vision ENT ROS: dizziness Allergy and Immunology ROS: negative for - hives or itchy/watery eyes Hematological and Lymphatic ROS: negative for - bleeding problems, bruising or swollen lymph nodes Endocrine ROS: negative for - galactorrhea, hair pattern changes, polydipsia/polyuria or temperature intolerance Respiratory ROS: shortness of breath, wheezing Cardiovascular ROS: chest pain Gastrointestinal ROS: negative for - abdominal pain, diarrhea, hematemesis, nausea/vomiting or stool incontinence Genito-Urinary ROS: negative for - dysuria, hematuria, incontinence or urinary frequency/urgency Musculoskeletal ROS: burning in hands Neurological ROS: as noted in HPI Dermatological ROS: hypopigmented lesions  Physical Examination: Blood pressure 132/75, pulse (!) 103, temperature 98.8  F (37.1 C), temperature source Oral, resp. rate (!) 22, height 5\' 5"  (1.651 m), weight 86.2 kg (190 lb), last menstrual period 09/26/2017, SpO2 100 %.  HEENT-  Normocephalic, facial swelling with lip stitches.  Normal external eye and conjunctiva.  Normal TM's bilaterally.  Normal auditory canals and external ears. Normal external nose, mucus membranes and septum.  Normal pharynx. Cardiovascular- S1, S2 normal, pulses palpable throughout   Lungs- chest clear, no wheezing, rales,  normal symmetric air entry Abdomen- soft, non-tender; bowel sounds normal; no masses,  no organomegaly Extremities- edema in the hands bilaterally Lymph-no adenopathy palpable Musculoskeletal-no joint tenderness, deformity or swelling Skin-darkening of the skin on the fingers.  Neurological Examination   Mental Status: Alert, oriented, thought content appropriate.  Speech fluent without evidence of aphasia.  Able to follow 3 step commands without difficulty. Cranial Nerves: II: Discs flat bilaterally; Visual fields grossly normal, pupils equal, round, reactive to light and accommodation III,IV, VI: left ptosis, extra-ocular motions intact bilaterally V,VII: intact corneals, facial light touch sensation normal bilaterally VIII: hearing normal bilaterally IX,X: gag reflex present XI: bilateral shoulder shrug XII: midline tongue extension Motor: Able to move upper extremities only minimally due to pain.  5/5 in the bilateral lower extremities Sensory: Hypesthesia in the hands and to the elbows bilaterally.   Sensation intact in the lower extremities Deep Tendon Reflexes: 2+ and symmetric throughout Plantars: Right: downgoing   Left: downgoing Cerebellar: Normal heel-to-shin testing on the right, minimally dysmetric on the left Gait: not tested due to safety concerns    Laboratory Studies:   Basic Metabolic Panel: Recent Labs  Lab 10/17/17 2113 10/18/17 0645  NA 135  --   K 4.3  --   CL 107  --   CO2 18*  --   GLUCOSE 202*  --   BUN 11  --   CREATININE 0.80 0.73  CALCIUM 9.0  --     Liver Function Tests: No results for input(s): AST, ALT, ALKPHOS, BILITOT, PROT, ALBUMIN in the last 168 hours. No results for input(s): LIPASE, AMYLASE in the last 168 hours. No results for input(s): AMMONIA in the last 168 hours.  CBC: Recent Labs  Lab 10/17/17 2113 10/18/17 0645  WBC 4.5 8.5  NEUTROABS 4.1  --   HGB 10.3* 9.3*  HCT 32.3* 30.1*  MCV 68.0* 68.0*  PLT 231 210     Cardiac Enzymes: Recent Labs  Lab 10/17/17 2113 10/18/17 0030 10/18/17 0645 10/18/17 1130  CKTOTAL  --   --   --  280*  TROPONINI <0.03 <0.03 <0.03 <0.03    BNP: Invalid input(s): POCBNP  CBG: No results for input(s): GLUCAP in the last 168 hours.  Microbiology: Results for orders placed or performed during the hospital encounter of 01/20/17  Culture, Urine     Status: None   Collection Time: 01/20/17  8:55 AM  Result Value Ref Range Status   Specimen Description URINE, CLEAN CATCH  Final   Special Requests NONE  Final   Culture   Final    NO GROWTH Performed at [facility], [place] [address] [place], [place] [address]    Report Status 01/21/2017 FINAL  Final    Coagulation Studies: No results for input(s): LABPROT, INR in the last 72 hours.  Urinalysis: No results for input(s): COLORURINE, LABSPEC, PHURINE, GLUCOSEU, HGBUR, BILIRUBINUR, KETONESUR, PROTEINUR, UROBILINOGEN, NITRITE, LEUKOCYTESUR in the last 168 hours.  Invalid input(s): APPERANCEUR  Lipid Panel:  No results found for: CHOL, TRIG, HDL,  CHOLHDL, VLDL, LDLCALC  HgbA1C: No results found for: HGBA1C  Urine Drug Screen:  No results found for: LABOPIA, COCAINSCRNUR, LABBENZ, AMPHETMU, THCU, LABBARB  Alcohol Level: No results for input(s): ETH in the last 168 hours.  Other results: EKG: sinus tachycardia at 104 bpm.   Imaging: Dg Wrist Complete Right  Result Date: 10/17/2017 CLINICAL DATA:  Status post fall, with bilateral hand tingling and throbbing. Initial encounter. EXAM: RIGHT WRIST - COMPLETE 3+ VIEW COMPARISON:  None. FINDINGS: There is no evidence of fracture or dislocation. The carpal rows are intact, and demonstrate normal alignment. The joint spaces are preserved. No significant soft tissue abnormalities are seen. IMPRESSION: No evidence of fracture or dislocation. Electronically Signed   By: Garald Balding M.D.   On: 10/17/2017 22:28   Ct Head Wo Contrast  Result Date:  10/17/2017 CLINICAL DATA:  Initial evaluation for acute trauma, fall. EXAM: CT HEAD WITHOUT CONTRAST CT MAXILLOFACIAL WITHOUT CONTRAST TECHNIQUE: Multidetector CT imaging of the head and maxillofacial structures were performed using the standard protocol without intravenous contrast. Multiplanar CT image reconstructions of the maxillofacial structures were also generated. COMPARISON:  None. FINDINGS: CT HEAD FINDINGS Brain: Cerebral volume within normal limits. No acute intracranial hemorrhage. No acute large vessel territory infarct. No mass lesion, midline shift or mass effect. No hydrocephalus. No extra-axial fluid collection. Vascular: No hyperdense vessel. Mild carotid siphon atherosclerosis. Skull: There is an apparent soft tissue contusion at the left frontoparietal scalp at the vertex. Calvarium intact. Other: Mastoid air cells are clear. CT MAXILLOFACIAL FINDINGS Osseous: Zygomatic arches intact. Pterygoid plates intact. No acute nasal bone fracture. Nasal septum intact. There is loosening of the maxillary central incisors bilaterally, right greater than left (series 7, image 45). No other acute maxillary fracture. Alveolar ridge intact. No acute mandibular fracture. Mandibular condyles normally situated. Orbits: Globes and orbital soft tissues within normal limits. Bony orbits intact. Sinuses: Small right sphenoid sinus retention cyst noted. Minimal right maxillary sinus mucosal thickening. No hemosinus. Soft tissues: Small soft tissue irregularity at the upper lip, likely reflecting laceration. No other acute soft tissue abnormality. IMPRESSION: CT HEAD: 1. No acute intracranial process. 2. Question left frontoparietal scalp contusion at the vertex. No calvarial fracture. CT MAXILLOFACIAL: 1. Loosening/slight displacement of the bilateral maxillary central incisors, right greater than left. Overlying liver laceration. 2. No other acute maxillofacial injury.  No other fracture. Electronically Signed   By:  Jeannine Boga M.D.   On: 10/17/2017 23:02   Ct Angio Chest Pe W And/or Wo Contrast  Result Date: 10/17/2017 CLINICAL DATA:  Status post fall, with loss of consciousness. Assess for pulmonary embolus. EXAM: CT ANGIOGRAPHY CHEST WITH CONTRAST TECHNIQUE: Multidetector CT imaging of the chest was performed using the standard protocol during bolus administration of intravenous contrast. Multiplanar CT image reconstructions and MIPs were obtained to evaluate the vascular anatomy. CONTRAST:  169mL ISOVUE-370 IOPAMIDOL (ISOVUE-370) INJECTION 76% COMPARISON:  Chest radiograph performed 10/26/2015 FINDINGS: Cardiovascular:  There is no evidence of pulmonary embolus. The heart is normal in size. The thoracic aorta is unremarkable. The great vessels are within normal limits. Mediastinum/Nodes: The mediastinum is unremarkable in appearance. No mediastinal lymphadenopathy is seen. No pericardial effusion is identified. The visualized portions of the thyroid gland are unremarkable. No axillary lymphadenopathy is seen. Lungs/Pleura: Minimal bibasilar atelectasis is noted. No pleural effusion or pneumothorax is seen. No masses are identified. Upper Abdomen: The visualized portions of the liver and spleen are unremarkable. The visualized portions of the pancreas, adrenal glands and kidneys  are within normal limits. Musculoskeletal: No acute osseous abnormalities are identified. The visualized musculature is unremarkable in appearance. Review of the MIP images confirms the above findings. IMPRESSION: 1. No evidence of pulmonary embolus. 2. Minimal bibasilar atelectasis noted.  Lungs otherwise clear. Electronically Signed   By: Garald Balding M.D.   On: 10/17/2017 22:47   Ct Cervical Spine Wo Contrast  Result Date: 10/18/2017 CLINICAL DATA:  Syncope and fall EXAM: CT CERVICAL SPINE WITHOUT CONTRAST TECHNIQUE: Multidetector CT imaging of the cervical spine was performed without intravenous contrast. Multiplanar CT image  reconstructions were also generated. COMPARISON:  None. FINDINGS: Alignment: No static subluxation. Facets are aligned. Occipital condyles and the lateral masses of C1 and C2 are normally approximated. Skull base and vertebrae: No acute fracture. Soft tissues and spinal canal: No prevertebral fluid or swelling. No visible canal hematoma. Disc levels: Disc herniations at C4-5 and C5-6 cause mild narrowing of the spinal canal. Mild-to-moderate neural foraminal narrowing is also present at these levels. Upper chest: No pneumothorax, pulmonary nodule or pleural effusion. Other: Normal visualized paraspinal cervical soft tissues. IMPRESSION: 1. No acute fracture or static subluxation of the cervical spine. 2. 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 Electronically Signed   By: Ulyses Jarred M.D.   On: 10/18/2017 02:43   US Carotid Bilateral  Result Date: 10/18/2017 CLINICAL DATA:  Syncopal episode. EXAM: BILATERAL CAROTID DUPLEX ULTRASOUND TECHNIQUE: Pearline Cables scale imaging, color Doppler and duplex ultrasound were performed of bilateral carotid and vertebral arteries in the neck. COMPARISON:  None. FINDINGS: Criteria: Quantification of carotid stenosis is based on velocity parameters that correlate the residual internal carotid diameter with NASCET-based stenosis levels, using the diameter of the distal internal carotid lumen as the denominator for stenosis measurement. The following velocity measurements were obtained: RIGHT ICA:  107/29 cm/sec CCA:  338/25 cm/sec SYSTOLIC ICA/CCA RATIO:  0.8 DIASTOLIC ICA/CCA RATIO:  1.0 ECA:  107 cm/sec LEFT ICA:  170/67 cm/sec CCA:  053/97 cm/sec SYSTOLIC ICA/CCA RATIO:  1.2 DIASTOLIC ICA/CCA RATIO:  0.9 ECA:  125 cm/sec RIGHT CAROTID ARTERY: There is a minimal amount of intimal thickening throughout the right common carotid artery. There is a minimal amount of intimal thickening within the right carotid bulb (images 14 and 15). There is a  minimal amount of atherosclerotic plaque involving the origin and proximal aspects of the right internal carotid artery (image 22), not resulting in elevated peak systolic velocities within the interrogated course the right internal carotid artery to suggest a hemodynamically significant stenosis RIGHT VERTEBRAL ARTERY:  Antegrade flow LEFT CAROTID ARTERY: There is a minimal amount of mixed echogenic atherosclerotic plaque within the left carotid bulb (image 47), which morphologically does not approach the 50% luminal narrowing in as such, elevated peak systolic velocities within left internal carotid artery are felt to be factitiously elevated due to transmission of elevated velocities from the left common carotid artery. LEFT VERTEBRAL ARTERY:  Antegrade flow IMPRESSION: Minimal amount of bilateral intimal thickening/atherosclerotic plaque, not definitely resulting in a hemodynamically significant stenosis within either internal carotid artery. Electronically Signed   By: Sandi Mariscal M.D.   On: 10/18/2017 11:02   Dg Hand Complete Left  Result Date: 10/17/2017 CLINICAL DATA:  Acute onset of left hand tingling and throbbing, after fall. Initial encounter. EXAM: LEFT HAND - COMPLETE 3+ VIEW COMPARISON:  None. FINDINGS: There is no evidence of fracture or dislocation. The joint spaces are preserved. The carpal rows are intact, and demonstrate normal alignment.  The soft tissues are unremarkable in appearance. IMPRESSION: No evidence of fracture or dislocation. Electronically Signed   By: Garald Balding M.D.   On: 10/17/2017 22:26   Dg Hand Complete Right  Result Date: 10/17/2017 CLINICAL DATA:  Status post fall, with right hand tingling and throbbing. EXAM: RIGHT HAND - COMPLETE 3+ VIEW COMPARISON:  None. FINDINGS: There is no evidence of fracture or dislocation. The joint spaces are preserved. The carpal rows are intact, and demonstrate normal alignment. The soft tissues are unremarkable in appearance.  IMPRESSION: No evidence of fracture or dislocation. Electronically Signed   By: Garald Balding M.D.   On: 10/17/2017 22:27   Ct Maxillofacial Wo Contrast  Result Date: 10/17/2017 CLINICAL DATA:  Initial evaluation for acute trauma, fall. EXAM: CT HEAD WITHOUT CONTRAST CT MAXILLOFACIAL WITHOUT CONTRAST TECHNIQUE: Multidetector CT imaging of the head and maxillofacial structures were performed using the standard protocol without intravenous contrast. Multiplanar CT image reconstructions of the maxillofacial structures were also generated. COMPARISON:  None. FINDINGS: CT HEAD FINDINGS Brain: Cerebral volume within normal limits. No acute intracranial hemorrhage. No acute large vessel territory infarct. No mass lesion, midline shift or mass effect. No hydrocephalus. No extra-axial fluid collection. Vascular: No hyperdense vessel. Mild carotid siphon atherosclerosis. Skull: There is an apparent soft tissue contusion at the left frontoparietal scalp at the vertex. Calvarium intact. Other: Mastoid air cells are clear. CT MAXILLOFACIAL FINDINGS Osseous: Zygomatic arches intact. Pterygoid plates intact. No acute nasal bone fracture. Nasal septum intact. There is loosening of the maxillary central incisors bilaterally, right greater than left (series 7, image 45). No other acute maxillary fracture. Alveolar ridge intact. No acute mandibular fracture. Mandibular condyles normally situated. Orbits: Globes and orbital soft tissues within normal limits. Bony orbits intact. Sinuses: Small right sphenoid sinus retention cyst noted. Minimal right maxillary sinus mucosal thickening. No hemosinus. Soft tissues: Small soft tissue irregularity at the upper lip, likely reflecting laceration. No other acute soft tissue abnormality. IMPRESSION: CT HEAD: 1. No acute intracranial process. 2. Question left frontoparietal scalp contusion at the vertex. No calvarial fracture. CT MAXILLOFACIAL: 1. Loosening/slight displacement of the  bilateral maxillary central incisors, right greater than left. Overlying liver laceration. 2. No other acute maxillofacial injury.  No other fracture. Electronically Signed   By: Jeannine Boga M.D.   On: 10/17/2017 23:02     Assessment/Plan: 42 year old female presenting after a syncopal episode of yesterday.  Head CT reviewed and shows no acute changes.  Patient on telemetry.  Not orthostatic.  Since patient was already feeling poorly that day patient may have been syncopal; due to vasodilation from her previous treatments.  She now has hand and arm burning.  Concerned that she may have contused her cord with the fall (no symptoms prior to fall).  CT of the cervical spine on presentation was unremarkable for cord compromise.  Further work up recommended.    Recommendations: 1.  MRI of the brain and cervical spine 2.  TSH 3.  EEG 4.  Agree with telemetry  Alexis Goodell, MD Neurology [phone] 10/18/2017, 1:10 PM

## 2017-10-18 NOTE — Consult Note (Signed)
Referring Physician:  No referring provider defined for this encounter.  Primary Physician:  Patient, No Pcp Per  Chief Complaint:  Hand burning  History of Present Illness: 10/18/2017 Alexis Beck is a 42 y.o. female who presents with the chief complaint of hand burning.  She initially presented to the hospital for syncope, with dizziness and wheezing.  She had a syncopal event at work yesterday.  When she come to, she noticed that her hands were burning.  Her hands continue to burn currently.    She has had a couple of near syncopal events recently as well.   Duration: 2 weeks total, 1 day of hand burning Quality: burning in hands Provoking: aggravated by anything touching her hands Alleviating: made better by nothing Weakness: present in her arms and hands Timing: since her fall Bowel/Bladder: no dysfunction  Alexis Beck has symptoms of cervical myelopathy - grip weakness, arm weakness, paresthesias  The symptoms are causing a significant impact on the patient's life.   Review of Systems:  A 10 point review of systems is negative, except for the pertinent positives and negatives detailed in the HPI.  Past Medical History: Past Medical History:  Diagnosis Date  . Anxiety   . Asthma   . Bipolar disorder ([place])   . Depression     Past Surgical History: Past Surgical History:  Procedure Laterality Date  . TUBAL LIGATION  2003    Allergies: Allergies as of 10/17/2017 - Review Complete 10/17/2017  Allergen Reaction Noted  . Shellfish allergy Anaphylaxis and Hives 03/08/2013    Medications:  Current Facility-Administered Medications:  .  acetaminophen (TYLENOL) tablet 650 mg, 650 mg, Oral, Q6H PRN **OR** acetaminophen (TYLENOL) suppository 650 mg, 650 mg, Rectal, Q6H PRN, Alexis Beck Beck, Beck .  albuterol (PROVENTIL) (2.5 MG/3ML) 0.083% nebulizer solution 2.5-5 mg, 2.5-5 mg, Inhalation, Q6H PRN, Alexis Beck Beck, Beck .  docusate sodium (COLACE) capsule 100 mg,  100 mg, Oral, BID, Alexis Beck Beck, Beck, 100 mg at 10/18/17 0912 .  enoxaparin (LOVENOX) injection 40 mg, 40 mg, Subcutaneous, Q24H, Alexis Beck Beck, Beck, 40 mg at 10/18/17 0549 .  fluticasone (FLONASE) 50 MCG/ACT nasal spray 2 spray, 2 spray, Each Nare, Daily, Alexis Beck Beck, Beck, 2 spray at 10/18/17 1109 .  HYDROcodone-acetaminophen (NORCO/VICODIN) 5-325 MG per tablet 1-2 tablet, 1-2 tablet, Oral, Q4H PRN, Alexis Beck Beck, Beck, 2 tablet at 10/18/17 1620 .  hydrOXYzine (ATARAX/VISTARIL) tablet 10 mg, 10 mg, Oral, TID PRN, Alexis Beck Beck, Beck .  ibuprofen (ADVIL,MOTRIN) 100 MG/5ML suspension 400 mg, 400 mg, Oral, Q6H PRN, Alexis Beck Beck, Beck .  ipratropium-albuterol (DUONEB) 0.5-2.5 (3) MG/3ML nebulizer solution 3 mL, 3 mL, Nebulization, Q6H PRN, Alexis Beck Beck, Beck .  loratadine (CLARITIN) tablet 10 mg, 10 mg, Oral, Daily, Alexis Beck Beck, Beck, 10 mg at 10/18/17 0912 .  ondansetron (ZOFRAN) tablet 4 mg, 4 mg, Oral, Q6H PRN **OR** ondansetron (ZOFRAN) injection 4 mg, 4 mg, Intravenous, Q6H PRN, Alexis Beck Beck, Beck .  polyethylene glycol (MIRALAX / GLYCOLAX) packet 17 g, 17 g, Oral, Daily PRN, Alexis Beck Beck, Beck .  predniSONE (DELTASONE) tablet 40 mg, 40 mg, Oral, Q breakfast, Alexis Beck Beck, Beck, 40 mg at 10/18/17 6195   Social History: Social History   Tobacco Use  . Smoking status: Never Smoker  . Smokeless tobacco: Never Used  Substance Use Topics  . Alcohol use: No  . Drug use: No    Family Medical History: Family History  Problem Relation  Age of Onset  . Drug abuse Mother   . Drug abuse Father   . Depression Brother   . Depression Maternal Grandmother   . Schizophrenia Cousin     Physical Examination: Vitals:   10/18/17 0410 10/18/17 0505  BP: 120/67 132/75  Pulse: (!) 106 (!) 103  Resp: (!) 21 (!) 22  Temp:  98.8 F (37.1 C)  SpO2: 100% 100%     General: Patient is well developed, well nourished, calm, collected. She is mildly upset due to her  current physical condition.  Psychiatric: Patient is non-anxious.  Head:  Pupils equal, round, and reactive to light.  ENT:  Oral mucosa appears well hydrated.  Neck:   Supple.  Full range of motion.  Respiratory: Patient is breathing without any difficulty.  Extremities: No edema.  Vascular: Palpable pulses in dorsal pedal vessels.  Skin:   On exposed skin, there are no abnormal skin lesions.  NEUROLOGICAL:  General: In no acute distress.   Awake, alert, oriented to person, place, and time.  Pupils equal round and reactive to light.  Facial tone is symmetric.  Tongue protrusion is midline.  There is no pronator drift.  ROM of spine: diminished.  Palpation of spine: nontender.    Strength: Side Biceps Triceps Deltoid Interossei Grip Wrist Ext. Wrist Flex.  R 4+ 4 4 2 2 2 3   L 4+ 4 4 3 3 2 3    Side Iliopsoas Quads Hamstring PF DF EHL  R 5 5 5 5 5 5   L 5 5 5 5 5 5    Reflexes are 3+ and symmetric at the biceps, triceps, brachioradialis, patella and achilles.   Bilateral upper and lower extremity sensation is intact to light touch and pin prick.  Clonus is not present.  Toes are down-going.  Gait is untested.  Hoffman's is present.  Imaging: MRI C spine 10/18/2017 IMPRESSION: Cervical MRI:  1. C4-5 and C5-6 disc protrusions with cord flattening and mild T2 hyperintensity (see axial) that is attributed to cord contusion in this clinical setting. 2. Prevertebral edema attributed to soft tissue injury. No listhesis or fracture. 3. C4-5 and C5-6 bilateral foraminal impingement due to disc height loss and uncovertebral ridging. 4. C6-7 small right paracentral disc protrusion without impingement.  Brain MRI:  Negative.   Electronically Signed   By: Alexis Beck M.Beck.   On: 10/18/2017 16:30  CT C spine 10/18/2017 IMPRESSION: 1. No acute fracture or static subluxation of the cervical spine. 2. Midcervical degenerative disc disease with mild C4-5 and C5-6 spinal  canal narrowing.   Electronically Signed   By: Alexis Beck M.Beck.   On: 10/18/2017 02:43  I have personally reviewed the images and agree with the above interpretation.  Assessment and Plan: Alexis Beck is a pleasant 42 y.o. 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    She requires higher level of care.  I recommend transfer to ICU at [facility].  We will place collar and arrange transfer.  I would recommend surgical intervention with C4-7 ACDF within 24 hours.  I will contact the transfer center for assistance.  I have discussed the condition with the patient and her family, including showing the radiographs and discussing treatment options in layman's terms.     Emmaus Brandi K. Izora Ribas Beck, [place] [facility]

## 2017-10-18 NOTE — Plan of Care (Signed)
  Safety: Ability to remain free from injury will improve 10/18/2017 1459 - Progressing by Oris Drone, RN  Pt remains on High Fall Risks; 1 person assist oob to bathroom this shift. Pain Managment: General experience of comfort will improve 10/18/2017 1459 - Not Progressing by Oris Drone, RN  -PRN Norco given for c/o bil hand and arm pain with decrease this shift Skin Integrity: Risk for impaired skin integrity will decrease 10/18/2017 1459 - Not Progressing by Oris Drone, RN  Pt c/o bil hand/arm numbness, tingling and burning sensation this shift; Dr Serita Grit having Dr Reynolds/Neurologist see the pt for ? Seizures; no seizure activity noted this shift

## 2017-10-18 NOTE — Progress Notes (Signed)
[place]                                                                                                                                                                                  Patient Demographics   Alexis Beck, is a 42 y.o. female, DOB - [birth year], GQQ:[id number]  Admit date - 10/17/2017   Admitting Physician [name], MD  Outpatient Primary MD for the patient is Patient, No Pcp Per   LOS - 0  Subjective: Patient admitted with passing out episode. She states that she was standing when this happened she does not recall what happened next thing she remembered was that she was in the ambulance. She reports that she has history of having episodes where she feels like she is going to pass out due to anxiety but has never completely passed out. Complains of severe pain in both of her hands.    Review of Systems:   CONSTITUTIONAL: No documented fever. No fatigue, weakness. No weight gain, no weight loss.  EYES: No blurry or double vision.  ENT: No tinnitus. No postnasal drip. No redness of the oropharynx.  RESPIRATORY: No cough, no wheeze, no hemoptysis. No dyspnea.  CARDIOVASCULAR: No chest pain. No orthopnea. No palpitations. No syncope.  GASTROINTESTINAL: No nausea, no vomiting or diarrhea. No abdominal pain. No melena or hematochezia.  GENITOURINARY: No dysuria or hematuria.  ENDOCRINE: No polyuria or nocturia. No heat or cold intolerance.  HEMATOLOGY: No anemia. No bruising. No bleeding.  INTEGUMENTARY: No rashes. No lesions.  MUSCULOSKELETAL: No arthritis. No swelling. No gout.  Bilateral hand pain NEUROLOGIC: No numbness, tingling, or ataxia. No seizure-type activity.  PSYCHIATRIC: No anxiety. No insomnia. No ADD.    Vitals:   Vitals:   10/18/17 0000 10/18/17 0130 10/18/17 0410 10/18/17 0505  BP: 138/69 139/66 120/67 132/75  Pulse: (!) 120 (!) 117 (!) 106 (!) 103  Resp: 12 (!) 26 (!) 21 (!) 22  Temp:    98.8 F  (37.1 C)  TempSrc:    Oral  SpO2: 100% 98% 100% 100%  Weight:      Height:        Wt Readings from Last 3 Encounters:  10/17/17 190 lb (86.2 kg)  10/17/17 190 lb (86.2 kg)  01/20/17 191 lb (86.6 kg)     Intake/Output Summary (Last 24 hours) at 10/18/2017 1310 Last data filed at 10/18/2017 0919 Gross per 24 hour  Intake 1512.71 ml  Output -  Net 1512.71 ml    Physical Exam:   GENERAL: Pleasant-appearing in no apparent distress.  HEAD, EYES, EARS, NOSE AND THROAT: Atraumatic, normocephalic. Extraocular muscles are intact. Pupils  equal and reactive to light. Sclerae anicteric. No conjunctival injection. No oro-pharyngeal erythema.  NECK: Supple. There is no jugular venous distention. No bruits, no lymphadenopathy, no thyromegaly.  HEART: Regular rate and rhythm,. No murmurs, no rubs, no clicks.  LUNGS: Clear to auscultation bilaterally. No rales or rhonchi. No wheezes.  ABDOMEN: Soft, flat, nontender, nondistended. Has good bowel sounds. No hepatosplenomegaly appreciated.  EXTREMITIES: No evidence of any cyanosis, clubbing, or peripheral edema.  +2 pedal and radial pulses bilaterally.  Tender to touch upper extremity NEUROLOGIC: The patient is alert, awake, and oriented x3 with no focal motor or sensory deficits appreciated bilaterally.  SKIN: Moist and warm with no rashes appreciated.  Psych: Not anxious, depressed LN: No inguinal LN enlargement    Antibiotics   Anti-infectives (From admission, onward)   None      Medications   Scheduled Meds: . docusate sodium  100 mg Oral BID  . enoxaparin (LOVENOX) injection  40 mg Subcutaneous Q24H  . fluticasone  2 spray Each Nare Daily  . loratadine  10 mg Oral Daily  . predniSONE  40 mg Oral Q breakfast   Continuous Infusions: PRN Meds:.acetaminophen **OR** acetaminophen, albuterol, HYDROcodone-acetaminophen, hydrOXYzine, ibuprofen, ipratropium-albuterol, ondansetron **OR** ondansetron (ZOFRAN) IV, polyethylene  glycol   Data Review:   Micro Results No results found for this or any previous visit (from the past 240 hour(s)).  Radiology Reports Dg Wrist Complete Right  Result Date: 10/17/2017 CLINICAL DATA:  Status post fall, with bilateral hand tingling and throbbing. Initial encounter. EXAM: RIGHT WRIST - COMPLETE 3+ VIEW COMPARISON:  None. FINDINGS: There is no evidence of fracture or dislocation. The carpal rows are intact, and demonstrate normal alignment. The joint spaces are preserved. No significant soft tissue abnormalities are seen. IMPRESSION: No evidence of fracture or dislocation. Electronically Signed   By: Garald Balding M.D.   On: 10/17/2017 22:28   Ct Head Wo Contrast  Result Date: 10/17/2017 CLINICAL DATA:  Initial evaluation for acute trauma, fall. EXAM: CT HEAD WITHOUT CONTRAST CT MAXILLOFACIAL WITHOUT CONTRAST TECHNIQUE: Multidetector CT imaging of the head and maxillofacial structures were performed using the standard protocol without intravenous contrast. Multiplanar CT image reconstructions of the maxillofacial structures were also generated. COMPARISON:  None. FINDINGS: CT HEAD FINDINGS Brain: Cerebral volume within normal limits. No acute intracranial hemorrhage. No acute large vessel territory infarct. No mass lesion, midline shift or mass effect. No hydrocephalus. No extra-axial fluid collection. Vascular: No hyperdense vessel. Mild carotid siphon atherosclerosis. Skull: There is an apparent soft tissue contusion at the left frontoparietal scalp at the vertex. Calvarium intact. Other: Mastoid air cells are clear. CT MAXILLOFACIAL FINDINGS Osseous: Zygomatic arches intact. Pterygoid plates intact. No acute nasal bone fracture. Nasal septum intact. There is loosening of the maxillary central incisors bilaterally, right greater than left (series 7, image 45). No other acute maxillary fracture. Alveolar ridge intact. No acute mandibular fracture. Mandibular condyles normally situated.  Orbits: Globes and orbital soft tissues within normal limits. Bony orbits intact. Sinuses: Small right sphenoid sinus retention cyst noted. Minimal right maxillary sinus mucosal thickening. No hemosinus. Soft tissues: Small soft tissue irregularity at the upper lip, likely reflecting laceration. No other acute soft tissue abnormality. IMPRESSION: CT HEAD: 1. No acute intracranial process. 2. Question left frontoparietal scalp contusion at the vertex. No calvarial fracture. CT MAXILLOFACIAL: 1. Loosening/slight displacement of the bilateral maxillary central incisors, right greater than left. Overlying liver laceration. 2. No other acute maxillofacial injury.  No other fracture. Electronically Signed  By: Jeannine Boga M.D.   On: 10/17/2017 23:02   Ct Angio Chest Pe W And/or Wo Contrast  Result Date: 10/17/2017 CLINICAL DATA:  Status post fall, with loss of consciousness. Assess for pulmonary embolus. EXAM: CT ANGIOGRAPHY CHEST WITH CONTRAST TECHNIQUE: Multidetector CT imaging of the chest was performed using the standard protocol during bolus administration of intravenous contrast. Multiplanar CT image reconstructions and MIPs were obtained to evaluate the vascular anatomy. CONTRAST:  17m ISOVUE-370 IOPAMIDOL (ISOVUE-370) INJECTION 76% COMPARISON:  Chest radiograph performed 10/26/2015 FINDINGS: Cardiovascular:  There is no evidence of pulmonary embolus. The heart is normal in size. The thoracic aorta is unremarkable. The great vessels are within normal limits. Mediastinum/Nodes: The mediastinum is unremarkable in appearance. No mediastinal lymphadenopathy is seen. No pericardial effusion is identified. The visualized portions of the thyroid gland are unremarkable. No axillary lymphadenopathy is seen. Lungs/Pleura: Minimal bibasilar atelectasis is noted. No pleural effusion or pneumothorax is seen. No masses are identified. Upper Abdomen: The visualized portions of the liver and spleen are  unremarkable. The visualized portions of the pancreas, adrenal glands and kidneys are within normal limits. Musculoskeletal: No acute osseous abnormalities are identified. The visualized musculature is unremarkable in appearance. Review of the MIP images confirms the above findings. IMPRESSION: 1. No evidence of pulmonary embolus. 2. Minimal bibasilar atelectasis noted.  Lungs otherwise clear. Electronically Signed   By: JGarald BaldingM.D.   On: 10/17/2017 22:47   Ct Cervical Spine Wo Contrast  Result Date: 10/18/2017 CLINICAL DATA:  Syncope and fall EXAM: CT CERVICAL SPINE WITHOUT CONTRAST TECHNIQUE: Multidetector CT imaging of the cervical spine was performed without intravenous contrast. Multiplanar CT image reconstructions were also generated. COMPARISON:  None. FINDINGS: Alignment: No static subluxation. Facets are aligned. Occipital condyles and the lateral masses of C1 and C2 are normally approximated. Skull base and vertebrae: No acute fracture. Soft tissues and spinal canal: No prevertebral fluid or swelling. No visible canal hematoma. Disc levels: Disc herniations at C4-5 and C5-6 cause mild narrowing of the spinal canal. Mild-to-moderate neural foraminal narrowing is also present at these levels. Upper chest: No pneumothorax, pulmonary nodule or pleural effusion. Other: Normal visualized paraspinal cervical soft tissues. IMPRESSION: 1. No acute fracture or static subluxation of the cervical spine. 2. 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 Electronically Signed   By: KUlyses JarredM.D.   On: 10/18/2017 02:43   UKoreaCarotid Bilateral  Result Date: 10/18/2017 CLINICAL DATA:  Syncopal episode. EXAM: BILATERAL CAROTID DUPLEX ULTRASOUND TECHNIQUE: GPearline Cablesscale imaging, color Doppler and duplex ultrasound were performed of bilateral carotid and vertebral arteries in the neck. COMPARISON:  None. FINDINGS: Criteria: Quantification of carotid stenosis is based on  velocity parameters that correlate the residual internal carotid diameter with NASCET-based stenosis levels, using the diameter of the distal internal carotid lumen as the denominator for stenosis measurement. The following velocity measurements were obtained: RIGHT ICA:  107/29 cm/sec CCA:  1272/53cm/sec SYSTOLIC ICA/CCA RATIO:  0.8 DIASTOLIC ICA/CCA RATIO:  1.0 ECA:  107 cm/sec LEFT ICA:  170/67 cm/sec CCA:  1664/40cm/sec SYSTOLIC ICA/CCA RATIO:  1.2 DIASTOLIC ICA/CCA RATIO:  0.9 ECA:  125 cm/sec RIGHT CAROTID ARTERY: There is a minimal amount of intimal thickening throughout the right common carotid artery. There is a minimal amount of intimal thickening within the right carotid bulb (images 14 and 15). There is a minimal amount of atherosclerotic plaque involving the origin and proximal aspects of the right internal carotid artery (  image 22), not resulting in elevated peak systolic velocities within the interrogated course the right internal carotid artery to suggest a hemodynamically significant stenosis RIGHT VERTEBRAL ARTERY:  Antegrade flow LEFT CAROTID ARTERY: There is a minimal amount of mixed echogenic atherosclerotic plaque within the left carotid bulb (image 47), which morphologically does not approach the 50% luminal narrowing in as such, elevated peak systolic velocities within left internal carotid artery are felt to be factitiously elevated due to transmission of elevated velocities from the left common carotid artery. LEFT VERTEBRAL ARTERY:  Antegrade flow IMPRESSION: Minimal amount of bilateral intimal thickening/atherosclerotic plaque, not definitely resulting in a hemodynamically significant stenosis within either internal carotid artery. Electronically Signed   By: Sandi Mariscal M.D.   On: 10/18/2017 11:02   Dg Hand Complete Left  Result Date: 10/17/2017 CLINICAL DATA:  Acute onset of left hand tingling and throbbing, after fall. Initial encounter. EXAM: LEFT HAND - COMPLETE 3+ VIEW  COMPARISON:  None. FINDINGS: There is no evidence of fracture or dislocation. The joint spaces are preserved. The carpal rows are intact, and demonstrate normal alignment. The soft tissues are unremarkable in appearance. IMPRESSION: No evidence of fracture or dislocation. Electronically Signed   By: Garald Balding M.D.   On: 10/17/2017 22:26   Dg Hand Complete Right  Result Date: 10/17/2017 CLINICAL DATA:  Status post fall, with right hand tingling and throbbing. EXAM: RIGHT HAND - COMPLETE 3+ VIEW COMPARISON:  None. FINDINGS: There is no evidence of fracture or dislocation. The joint spaces are preserved. The carpal rows are intact, and demonstrate normal alignment. The soft tissues are unremarkable in appearance. IMPRESSION: No evidence of fracture or dislocation. Electronically Signed   By: Garald Balding M.D.   On: 10/17/2017 22:27   Ct Maxillofacial Wo Contrast  Result Date: 10/17/2017 CLINICAL DATA:  Initial evaluation for acute trauma, fall. EXAM: CT HEAD WITHOUT CONTRAST CT MAXILLOFACIAL WITHOUT CONTRAST TECHNIQUE: Multidetector CT imaging of the head and maxillofacial structures were performed using the standard protocol without intravenous contrast. Multiplanar CT image reconstructions of the maxillofacial structures were also generated. COMPARISON:  None. FINDINGS: CT HEAD FINDINGS Brain: Cerebral volume within normal limits. No acute intracranial hemorrhage. No acute large vessel territory infarct. No mass lesion, midline shift or mass effect. No hydrocephalus. No extra-axial fluid collection. Vascular: No hyperdense vessel. Mild carotid siphon atherosclerosis. Skull: There is an apparent soft tissue contusion at the left frontoparietal scalp at the vertex. Calvarium intact. Other: Mastoid air cells are clear. CT MAXILLOFACIAL FINDINGS Osseous: Zygomatic arches intact. Pterygoid plates intact. No acute nasal bone fracture. Nasal septum intact. There is loosening of the maxillary central incisors  bilaterally, right greater than left (series 7, image 45). No other acute maxillary fracture. Alveolar ridge intact. No acute mandibular fracture. Mandibular condyles normally situated. Orbits: Globes and orbital soft tissues within normal limits. Bony orbits intact. Sinuses: Small right sphenoid sinus retention cyst noted. Minimal right maxillary sinus mucosal thickening. No hemosinus. Soft tissues: Small soft tissue irregularity at the upper lip, likely reflecting laceration. No other acute soft tissue abnormality. IMPRESSION: CT HEAD: 1. No acute intracranial process. 2. Question left frontoparietal scalp contusion at the vertex. No calvarial fracture. CT MAXILLOFACIAL: 1. Loosening/slight displacement of the bilateral maxillary central incisors, right greater than left. Overlying liver laceration. 2. No other acute maxillofacial injury.  No other fracture. Electronically Signed   By: Jeannine Boga M.D.   On: 10/17/2017 23:02     CBC Recent Labs  Lab 10/17/17 2113 10/18/17  0645  WBC 4.5 8.5  HGB 10.3* 9.3*  HCT 32.3* 30.1*  PLT 231 210  MCV 68.0* 68.0*  MCH 21.6* 21.1*  MCHC 31.8* 31.1*  RDW 19.5* 19.0*  LYMPHSABS 0.3*  --   MONOABS 0.1*  --   EOSABS 0.0  --   BASOSABS 0.0  --     Chemistries  Recent Labs  Lab 10/17/17 2113 10/18/17 0645  NA 135  --   K 4.3  --   CL 107  --   CO2 18*  --   GLUCOSE 202*  --   BUN 11  --   CREATININE 0.80 0.73  CALCIUM 9.0  --    ------------------------------------------------------------------------------------------------------------------ estimated creatinine clearance is 98.3 mL/min (by C-G formula based on SCr of 0.73 mg/dL). ------------------------------------------------------------------------------------------------------------------ No results for input(s): HGBA1C in the last 72 hours. ------------------------------------------------------------------------------------------------------------------ No results for input(s):  CHOL, HDL, LDLCALC, TRIG, CHOLHDL, LDLDIRECT in the last 72 hours. ------------------------------------------------------------------------------------------------------------------ No results for input(s): TSH, T4TOTAL, T3FREE, THYROIDAB in the last 72 hours.  Invalid input(s): FREET3 ------------------------------------------------------------------------------------------------------------------ No results for input(s): VITAMINB12, FOLATE, FERRITIN, TIBC, IRON, RETICCTPCT in the last 72 hours.  Coagulation profile No results for input(s): INR, PROTIME in the last 168 hours.  No results for input(s): DDIMER in the last 72 hours.  Cardiac Enzymes Recent Labs  Lab 10/18/17 0030 10/18/17 0645 10/18/17 1130  TROPONINI <0.03 <0.03 <0.03   ------------------------------------------------------------------------------------------------------------------ Invalid input(s): POCBNP    Assessment & Plan   1 acute recurrent syncope Previous episode was due to panic attack Based on her lip being cut as well as possible loosening of her tooth this is a real episode I will asked neurology to see seizure needs to be ruled out Echocardiogram of the heart pending Carotid Dopplers pending    2 acute sinus tachycardia Exact etiology unknown, ?  Mild dehydration IV fluids for rehydration, vitals per routine Check echocardiogram of the heart    3.  Upper extremity pain unclear etiology I will check a ESR ANA and sed rate  4 acute asthmatic exacerbation Stable Continue prednisone, breathing treatments as needed  5 history of anxiety Stable Vistaril as needed  6 obesity, chronic Most likely secondary to excess calories Lifestyle modification recommended       Code Status Orders  (From admission, onward)        Start     Ordered   10/18/17 0459  Full code  Continuous     10/18/17 0459    Code Status History    Date Active Date Inactive Code Status Order ID Comments  User Context   03/08/2013 07:24 03/09/2013 16:47 Full Code [id number]  Reyne Dumas, MD ED           Consults  neuro  DVT Prophylaxis  Lovenox   Lab Results  Component Value Date   PLT 210 10/18/2017     Time Spent in minutes   53mn Greater than 50% of time spent in care coordination and counseling patient regarding the condition and plan of care.   SDustin FlockM.D on 10/18/2017 at 1:10 PM  Between 7am to 6pm - Pager - [phone]  After 6pm go to [email or web address] - [email or web address]   Office  [phone]

## 2017-10-18 NOTE — Progress Notes (Signed)
*  PRELIMINARY RESULTS* Echocardiogram 2D Echocardiogram has been performed.  Alexis Beck 10/18/2017, 10:47 AM

## 2017-10-18 NOTE — Discharge Summary (Signed)
Alexis Beck, [place] y.o., DOB 1976/03/07, MRN [id number]. Admission date: 10/17/2017 Discharge Date 10/18/2017 Primary MD Patient, No Pcp Per Admitting Physician [name], MD  Admission Diagnosis  Syncope and collapse [R55] Bilateral hand pain [M79.641, M79.642] Lip laceration, initial encounter [S01.511A]  Discharge Diagnosis   Active Problems:   Spinal cord trauma Syncope and collapse Sinus tachycardia Acute asthma exasperation Anxiety Chronic obesity Elevated blood glucose  Hospital Course  Alexis Beck  is a 42 y.o. female with a known history per below, recently seen in clinic and diagnosed for asthmatic exacerbation, presented to the emergency room with acute syncope with collapse resulting in fall, sustained upper lip laceration status post repair in the emergency room, extensive ER workup noted for this mild sinus tachycardia, tachypnea, glucose 202, CT head/C-spine/CT chest/CT face were unremarkable, patient evaluated in the emergency room, no apparent distress, stated previous episode of syncope was related to panic attack, she was admitted for syncope.  I have evaluated the patient and she started complaining of numbness and pain in both of her hands with severe sensitivity.  Therefore neurology consult was obtained.  She underwent MRI of the cervical spine which shows findings consistent injury to the spinal cord.  Therefore a neurosurgery consult was obtained.  They have arrange patient to be transported to [facility] for further evaluation and treatment.              Consults  Neurosurgery and neurology  Significant Tests:  See full reports for all details    Dg Wrist Complete Right  Result Date: 10/17/2017 CLINICAL DATA:  Status post fall, with bilateral hand tingling and throbbing. Initial encounter. EXAM: RIGHT WRIST - COMPLETE 3+ VIEW COMPARISON:  None. FINDINGS: There is no evidence of fracture or  dislocation. The carpal rows are intact, and demonstrate normal alignment. The joint spaces are preserved. No significant soft tissue abnormalities are seen. IMPRESSION: No evidence of fracture or dislocation. Electronically Signed   By: Garald Balding M.D.   On: 10/17/2017 22:28   Ct Head Wo Contrast  Result Date: 10/17/2017 CLINICAL DATA:  Initial evaluation for acute trauma, fall. EXAM: CT HEAD WITHOUT CONTRAST CT MAXILLOFACIAL WITHOUT CONTRAST TECHNIQUE: Multidetector CT imaging of the head and maxillofacial structures were performed using the standard protocol without intravenous contrast. Multiplanar CT image reconstructions of the maxillofacial structures were also generated. COMPARISON:  None. FINDINGS: CT HEAD FINDINGS Brain: Cerebral volume within normal limits. No acute intracranial hemorrhage. No acute large vessel territory infarct. No mass lesion, midline shift or mass effect. No hydrocephalus. No extra-axial fluid collection. Vascular: No hyperdense vessel. Mild carotid siphon atherosclerosis. Skull: There is an apparent soft tissue contusion at the left frontoparietal scalp at the vertex. Calvarium intact. Other: Mastoid air cells are clear. CT MAXILLOFACIAL FINDINGS Osseous: Zygomatic arches intact. Pterygoid plates intact. No acute nasal bone fracture. Nasal septum intact. There is loosening of the maxillary central incisors bilaterally, right greater than left (series 7, image 45). No other acute maxillary fracture. Alveolar ridge intact. No acute mandibular fracture. Mandibular condyles normally situated. Orbits: Globes and orbital soft tissues within normal limits. Bony orbits intact. Sinuses: Small right sphenoid sinus retention cyst noted. Minimal right maxillary sinus mucosal thickening. No hemosinus. Soft tissues: Small soft tissue irregularity at the upper lip, likely reflecting laceration. No other acute soft tissue abnormality. IMPRESSION: CT HEAD: 1. No acute intracranial process. 2.  Question left frontoparietal scalp contusion at the vertex. No calvarial  fracture. CT MAXILLOFACIAL: 1. Loosening/slight displacement of the bilateral maxillary central incisors, right greater than left. Overlying liver laceration. 2. No other acute maxillofacial injury.  No other fracture. Electronically Signed   By: Jeannine Boga M.D.   On: 10/17/2017 23:02   Ct Angio Chest Pe W And/or Wo Contrast  Result Date: 10/17/2017 CLINICAL DATA:  Status post fall, with loss of consciousness. Assess for pulmonary embolus. EXAM: CT ANGIOGRAPHY CHEST WITH CONTRAST TECHNIQUE: Multidetector CT imaging of the chest was performed using the standard protocol during bolus administration of intravenous contrast. Multiplanar CT image reconstructions and MIPs were obtained to evaluate the vascular anatomy. CONTRAST:  146mL ISOVUE-370 IOPAMIDOL (ISOVUE-370) INJECTION 76% COMPARISON:  Chest radiograph performed 10/26/2015 FINDINGS: Cardiovascular:  There is no evidence of pulmonary embolus. The heart is normal in size. The thoracic aorta is unremarkable. The great vessels are within normal limits. Mediastinum/Nodes: The mediastinum is unremarkable in appearance. No mediastinal lymphadenopathy is seen. No pericardial effusion is identified. The visualized portions of the thyroid gland are unremarkable. No axillary lymphadenopathy is seen. Lungs/Pleura: Minimal bibasilar atelectasis is noted. No pleural effusion or pneumothorax is seen. No masses are identified. Upper Abdomen: The visualized portions of the liver and spleen are unremarkable. The visualized portions of the pancreas, adrenal glands and kidneys are within normal limits. Musculoskeletal: No acute osseous abnormalities are identified. The visualized musculature is unremarkable in appearance. Review of the MIP images confirms the above findings. IMPRESSION: 1. No evidence of pulmonary embolus. 2. Minimal bibasilar atelectasis noted.  Lungs otherwise clear.  Electronically Signed   By: Garald Balding M.D.   On: 10/17/2017 22:47   Ct Cervical Spine Wo Contrast  Result Date: 10/18/2017 CLINICAL DATA:  Syncope and fall EXAM: CT CERVICAL SPINE WITHOUT CONTRAST TECHNIQUE: Multidetector CT imaging of the cervical spine was performed without intravenous contrast. Multiplanar CT image reconstructions were also generated. COMPARISON:  None. FINDINGS: Alignment: No static subluxation. Facets are aligned. Occipital condyles and the lateral masses of C1 and C2 are normally approximated. Skull base and vertebrae: No acute fracture. Soft tissues and spinal canal: No prevertebral fluid or swelling. No visible canal hematoma. Disc levels: Disc herniations at C4-5 and C5-6 cause mild narrowing of the spinal canal. Mild-to-moderate neural foraminal narrowing is also present at these levels. Upper chest: No pneumothorax, pulmonary nodule or pleural effusion. Other: Normal visualized paraspinal cervical soft tissues. IMPRESSION: 1. No acute fracture or static subluxation of the cervical spine. 2. 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 Electronically Signed   By: Ulyses Jarred M.D.   On: 10/18/2017 02:43   Mr Brain Wo Contrast  Result Date: 10/18/2017 CLINICAL DATA:  Recurrent syncope. Asthma with wheezing and dizziness. After syncope there is new bilateral extremity paresthesia. EXAM: MRI HEAD WITHOUT CONTRAST MRI CERVICAL SPINE WITHOUT CONTRAST TECHNIQUE: Multiplanar, multiecho pulse sequences of the brain and surrounding structures, and cervical spine, to include the craniocervical junction and cervicothoracic junction, were obtained without intravenous contrast. COMPARISON:  Cervical spine CT from earlier today. FINDINGS: MRI HEAD FINDINGS Brain: No acute infarction, hemorrhage, hydrocephalus, extra-axial collection or mass lesion. Possible single FLAIR hyperintensity in the right cerebral white matter, allowable for age. Normal  brain volume. Vascular: Major flow voids are preserved, including vertebrobasilar. Skull and upper cervical spine: Negative Sinuses/Orbits: Negative MRI CERVICAL SPINE FINDINGS Alignment: Normal Vertebrae: No fracture, evidence of discitis, or bone lesion. Cord: T2 hyperintensity in the left central cord at C4-5 and bilaterally in the central cord at  C5-6. The cord is compressed at both levels. See axial T2 weighted imaging. No cord hemorrhage. Posterior Fossa, vertebral arteries, paraspinal tissues: Prevertebral fluid from C2-C5 attributed to injury. No major ligamentous disruption is noted. Disc levels: C2-3: Unremarkable. C3-4: Mild uncovertebral spurring.  No impingement C4-5: Disc narrowing with central protrusion flattening the ventral cord, worse towards the left where there is the T2 hyperintensity. Uncovertebral spurring with moderate bilateral foraminal impingement. C5-6: Disc narrowing with central broad protrusion flattening the ventral cord. Mild cord T2 hyperintensity. Disc height loss and uncovertebral spurring causes right more than left foraminal impingement C6-7: Small right eccentric disc protrusion without cord mass effect. Patent foramina. C7-T1:Unremarkable. Case discussed via telephone 10/18/2017 at 4:29 pm with Dr. Alexis Goodell . IMPRESSION: Cervical MRI: 1. C4-5 and C5-6 disc protrusions with cord flattening and mild T2 hyperintensity (see axial) that is attributed to cord contusion in this clinical setting. 2. Prevertebral edema attributed to soft tissue injury. No listhesis or fracture. 3. C4-5 and C5-6 bilateral foraminal impingement due to disc height loss and uncovertebral ridging. 4. C6-7 small right paracentral disc protrusion without impingement. Brain MRI: Negative. Electronically Signed   By: Monte Fantasia M.D.   On: 10/18/2017 16:30   Mr Cervical Spine Wo Contrast  Result Date: 10/18/2017 CLINICAL DATA:  Recurrent syncope. Asthma with wheezing and dizziness. After syncope  there is new bilateral extremity paresthesia. EXAM: MRI HEAD WITHOUT CONTRAST MRI CERVICAL SPINE WITHOUT CONTRAST TECHNIQUE: Multiplanar, multiecho pulse sequences of the brain and surrounding structures, and cervical spine, to include the craniocervical junction and cervicothoracic junction, were obtained without intravenous contrast. COMPARISON:  Cervical spine CT from earlier today. FINDINGS: MRI HEAD FINDINGS Brain: No acute infarction, hemorrhage, hydrocephalus, extra-axial collection or mass lesion. Possible single FLAIR hyperintensity in the right cerebral white matter, allowable for age. Normal brain volume. Vascular: Major flow voids are preserved, including vertebrobasilar. Skull and upper cervical spine: Negative Sinuses/Orbits: Negative MRI CERVICAL SPINE FINDINGS Alignment: Normal Vertebrae: No fracture, evidence of discitis, or bone lesion. Cord: T2 hyperintensity in the left central cord at C4-5 and bilaterally in the central cord at C5-6. The cord is compressed at both levels. See axial T2 weighted imaging. No cord hemorrhage. Posterior Fossa, vertebral arteries, paraspinal tissues: Prevertebral fluid from C2-C5 attributed to injury. No major ligamentous disruption is noted. Disc levels: C2-3: Unremarkable. C3-4: Mild uncovertebral spurring.  No impingement C4-5: Disc narrowing with central protrusion flattening the ventral cord, worse towards the left where there is the T2 hyperintensity. Uncovertebral spurring with moderate bilateral foraminal impingement. C5-6: Disc narrowing with central broad protrusion flattening the ventral cord. Mild cord T2 hyperintensity. Disc height loss and uncovertebral spurring causes right more than left foraminal impingement C6-7: Small right eccentric disc protrusion without cord mass effect. Patent foramina. C7-T1:Unremarkable. Case discussed via telephone 10/18/2017 at 4:29 pm with Dr. Alexis Goodell . IMPRESSION: Cervical MRI: 1. C4-5 and C5-6 disc protrusions  with cord flattening and mild T2 hyperintensity (see axial) that is attributed to cord contusion in this clinical setting. 2. Prevertebral edema attributed to soft tissue injury. No listhesis or fracture. 3. C4-5 and C5-6 bilateral foraminal impingement due to disc height loss and uncovertebral ridging. 4. C6-7 small right paracentral disc protrusion without impingement. Brain MRI: Negative. Electronically Signed   By: Monte Fantasia M.D.   On: 10/18/2017 16:30   US Carotid Bilateral  Result Date: 10/18/2017 CLINICAL DATA:  Syncopal episode. EXAM: BILATERAL CAROTID DUPLEX ULTRASOUND TECHNIQUE: Pearline Cables scale imaging, color Doppler and duplex ultrasound were  performed of bilateral carotid and vertebral arteries in the neck. COMPARISON:  None. FINDINGS: Criteria: Quantification of carotid stenosis is based on velocity parameters that correlate the residual internal carotid diameter with NASCET-based stenosis levels, using the diameter of the distal internal carotid lumen as the denominator for stenosis measurement. The following velocity measurements were obtained: RIGHT ICA:  107/29 cm/sec CCA:  378/58 cm/sec SYSTOLIC ICA/CCA RATIO:  0.8 DIASTOLIC ICA/CCA RATIO:  1.0 ECA:  107 cm/sec LEFT ICA:  170/67 cm/sec CCA:  850/27 cm/sec SYSTOLIC ICA/CCA RATIO:  1.2 DIASTOLIC ICA/CCA RATIO:  0.9 ECA:  125 cm/sec RIGHT CAROTID ARTERY: There is a minimal amount of intimal thickening throughout the right common carotid artery. There is a minimal amount of intimal thickening within the right carotid bulb (images 14 and 15). There is a minimal amount of atherosclerotic plaque involving the origin and proximal aspects of the right internal carotid artery (image 22), not resulting in elevated peak systolic velocities within the interrogated course the right internal carotid artery to suggest a hemodynamically significant stenosis RIGHT VERTEBRAL ARTERY:  Antegrade flow LEFT CAROTID ARTERY: There is a minimal amount of mixed  echogenic atherosclerotic plaque within the left carotid bulb (image 47), which morphologically does not approach the 50% luminal narrowing in as such, elevated peak systolic velocities within left internal carotid artery are felt to be factitiously elevated due to transmission of elevated velocities from the left common carotid artery. LEFT VERTEBRAL ARTERY:  Antegrade flow IMPRESSION: Minimal amount of bilateral intimal thickening/atherosclerotic plaque, not definitely resulting in a hemodynamically significant stenosis within either internal carotid artery. Electronically Signed   By: Sandi Mariscal M.D.   On: 10/18/2017 11:02   Dg Hand Complete Left  Result Date: 10/17/2017 CLINICAL DATA:  Acute onset of left hand tingling and throbbing, after fall. Initial encounter. EXAM: LEFT HAND - COMPLETE 3+ VIEW COMPARISON:  None. FINDINGS: There is no evidence of fracture or dislocation. The joint spaces are preserved. The carpal rows are intact, and demonstrate normal alignment. The soft tissues are unremarkable in appearance. IMPRESSION: No evidence of fracture or dislocation. Electronically Signed   By: Garald Balding M.D.   On: 10/17/2017 22:26   Dg Hand Complete Right  Result Date: 10/17/2017 CLINICAL DATA:  Status post fall, with right hand tingling and throbbing. EXAM: RIGHT HAND - COMPLETE 3+ VIEW COMPARISON:  None. FINDINGS: There is no evidence of fracture or dislocation. The joint spaces are preserved. The carpal rows are intact, and demonstrate normal alignment. The soft tissues are unremarkable in appearance. IMPRESSION: No evidence of fracture or dislocation. Electronically Signed   By: Garald Balding M.D.   On: 10/17/2017 22:27   Ct Maxillofacial Wo Contrast  Result Date: 10/17/2017 CLINICAL DATA:  Initial evaluation for acute trauma, fall. EXAM: CT HEAD WITHOUT CONTRAST CT MAXILLOFACIAL WITHOUT CONTRAST TECHNIQUE: Multidetector CT imaging of the head and maxillofacial structures were performed  using the standard protocol without intravenous contrast. Multiplanar CT image reconstructions of the maxillofacial structures were also generated. COMPARISON:  None. FINDINGS: CT HEAD FINDINGS Brain: Cerebral volume within normal limits. No acute intracranial hemorrhage. No acute large vessel territory infarct. No mass lesion, midline shift or mass effect. No hydrocephalus. No extra-axial fluid collection. Vascular: No hyperdense vessel. Mild carotid siphon atherosclerosis. Skull: There is an apparent soft tissue contusion at the left frontoparietal scalp at the vertex. Calvarium intact. Other: Mastoid air cells are clear. CT MAXILLOFACIAL FINDINGS Osseous: Zygomatic arches intact. Pterygoid plates intact. No acute nasal bone fracture. Nasal  septum intact. There is loosening of the maxillary central incisors bilaterally, right greater than left (series 7, image 45). No other acute maxillary fracture. Alveolar ridge intact. No acute mandibular fracture. Mandibular condyles normally situated. Orbits: Globes and orbital soft tissues within normal limits. Bony orbits intact. Sinuses: Small right sphenoid sinus retention cyst noted. Minimal right maxillary sinus mucosal thickening. No hemosinus. Soft tissues: Small soft tissue irregularity at the upper lip, likely reflecting laceration. No other acute soft tissue abnormality. IMPRESSION: CT HEAD: 1. No acute intracranial process. 2. Question left frontoparietal scalp contusion at the vertex. No calvarial fracture. CT MAXILLOFACIAL: 1. Loosening/slight displacement of the bilateral maxillary central incisors, right greater than left. Overlying liver laceration. 2. No other acute maxillofacial injury.  No other fracture. Electronically Signed   By: Jeannine Boga M.D.   On: 10/17/2017 23:02         CBC w Diff:  Lab Results  Component Value Date   WBC 8.5 10/18/2017   HGB 9.3 (L) 10/18/2017   HCT 30.1 (L) 10/18/2017   PLT 210 10/18/2017   LYMPHOPCT 8  10/17/2017   MONOPCT 1 10/17/2017   EOSPCT 0 10/17/2017   BASOPCT 0 10/17/2017   CMP:  Lab Results  Component Value Date   NA 135 10/17/2017   K 4.3 10/17/2017   CL 107 10/17/2017   CO2 18 (L) 10/17/2017   BUN 11 10/17/2017   CREATININE 0.73 10/18/2017   PROT 6.9 03/09/2013   ALBUMIN 3.5 03/09/2013   BILITOT 0.4 03/09/2013   ALKPHOS 48 03/09/2013   AST 15 03/09/2013   ALT 11 03/09/2013  .  Micro Results No results found for this or any previous visit (from the past 240 hour(s)).      Code Status Orders  (From admission, onward)        Start     Ordered   10/18/17 0459  Full code  Continuous     10/18/17 0459    Code Status History    Date Active Date Inactive Code Status Order ID Comments User Context   03/08/2013 07:24 03/09/2013 16:47 Full Code [id number]  Reyne Dumas, MD ED          Follow-up Information    [place], Jefm Bryant. Schedule an appointment as soon as possible for a visit in 5 days.   Why:  For suture removal.  Reevaluation. Contact information: [place] [id number] [phone]           Discharge Medications   Allergies as of 10/18/2017      Reactions   Shellfish Allergy Anaphylaxis, Hives      Medication List    STOP taking these medications   ALEVE 220 MG Caps Generic drug:  Naproxen Sodium   predniSONE 10 MG tablet Commonly known as:  DELTASONE     TAKE these medications   albuterol 108 (90 Base) MCG/ACT inhaler Commonly known as:  PROVENTIL HFA;VENTOLIN HFA Inhale 1-2 puffs into the lungs every 6 (six) hours as needed for wheezing or shortness of breath.   cetirizine 10 MG tablet Commonly known as:  ZYRTEC Take 1 tablet (10 mg total) by mouth daily.   fluticasone 50 MCG/ACT nasal spray Commonly known as:  FLONASE Place 2 sprays into both nostrils daily.          Total Time in preparing paper work, data evaluation and todays exam - 82 minutes  Dustin Flock M.D on 10/18/2017 at  6:31 PM [facility]  [phone] 

## 2017-10-18 NOTE — H&P (Signed)
[facility] [place] NAME: Alexis Beck    MR#:  [id number]  DATE OF BIRTH:  [birth year]  DATE OF ADMISSION:  10/17/2017  PRIMARY CARE PHYSICIAN: Patient, No Pcp Per   REQUESTING/REFERRING PHYSICIAN:   CHIEF COMPLAINT:   Chief Complaint  Patient presents with  . Fall    HISTORY OF PRESENT ILLNESS: Alexis Beck  is a 42 y.o. female with a known history per below, recently seen in clinic and diagnosed for asthmatic exacerbation, presented to the emergency room with acute syncope with collapse resulting in fall, sustained upper lip laceration status post repair in the emergency room, extensive ER workup noted for this mild sinus tachycardia, tachypnea, glucose 202, CT head/C-spine/CT chest/CT face were unremarkable, patient evaluated in the emergency room, no apparent distress, stated previous episode of syncope was related to panic attack, patient is now being admitted for acute recurrent syncope with collapse.  PAST MEDICAL HISTORY:   Past Medical History:  Diagnosis Date  . Anxiety   . Asthma   . Bipolar disorder ([place])   . Depression     PAST SURGICAL HISTORY:  Past Surgical History:  Procedure Laterality Date  . TUBAL LIGATION  2003    SOCIAL HISTORY:  Social History   Tobacco Use  . Smoking status: Never Smoker  . Smokeless tobacco: Never Used  Substance Use Topics  . Alcohol use: No    FAMILY HISTORY:  Family History  Problem Relation Age of Onset  . Drug abuse Mother   . Drug abuse Father   . Depression Brother   . Depression Maternal Grandmother   . Schizophrenia Cousin     DRUG ALLERGIES:  Allergies  Allergen Reactions  . Shellfish Allergy Anaphylaxis and Hives    REVIEW OF SYSTEMS:   CONSTITUTIONAL: No fever, fatigue or weakness. +fall EYES: No blurred or double vision.  EARS, NOSE, AND THROAT: No tinnitus or ear pain.  RESPIRATORY: No cough, shortness of breath, wheezing or hemoptysis.  CARDIOVASCULAR:  No chest pain, orthopnea, edema.  GASTROINTESTINAL: No nausea, vomiting, diarrhea or abdominal pain.  GENITOURINARY: No dysuria, hematuria.  ENDOCRINE: No polyuria, nocturia,  HEMATOLOGY: No anemia, easy bruising or bleeding SKIN: No rash or lesion. MUSCULOSKELETAL: No joint pain or arthritis.   NEUROLOGIC: No tingling, numbness, weakness. +LOC PSYCHIATRY: No anxiety or depression.   MEDICATIONS AT HOME:  Prior to Admission medications   Medication Sig Start Date End Date Taking? Authorizing Provider  albuterol (PROVENTIL HFA;VENTOLIN HFA) 108 (90 Base) MCG/ACT inhaler Inhale 1-2 puffs into the lungs every 6 (six) hours as needed for wheezing or shortness of breath.    [provider]  cetirizine (ZYRTEC) 10 MG tablet Take 1 tablet (10 mg total) by mouth daily. 10/17/17   Maczis, Barth Kirks, PA-C  fluticasone (FLONASE) 50 MCG/ACT nasal spray Place 2 sprays into both nostrils daily. 10/17/17   Maczis, Barth Kirks, PA-C  Naproxen Sodium (ALEVE) 220 MG CAPS Take 440 mg by mouth 2 (two) times daily as needed (PAIN).    [provider]  predniSONE (DELTASONE) 10 MG tablet Take 4 tablets (40 mg total) by mouth daily with breakfast. 10/17/17   Maczis, Barth Kirks, PA-C      PHYSICAL EXAMINATION:   VITAL SIGNS: Blood pressure 139/66, pulse (!) 117, temperature 98.7 F (37.1 C), temperature source Oral, resp. rate (!) 26, height 5\' 5"  (1.651 m), weight 86.2 kg (190 lb), last menstrual period 09/26/2017, SpO2 98 %.  GENERAL:  42  y.o.-year-old patient lying in the bed with no acute distress. obese EYES: Pupils equal, round, reactive to light and accommodation. No scleral icterus. Extraocular muscles intact.  HEENT: Head atraumatic, normocephalic. Oropharynx and nasopharynx clear.  NECK:  Supple, no jugular venous distention. No thyroid enlargement, no tenderness.  LUNGS: Normal breath sounds bilaterally, no wheezing, rales,rhonchi or crepitation. No use of accessory muscles of  respiration.  CARDIOVASCULAR: S1, S2 normal. No murmurs, rubs, or gallops.  ABDOMEN: Soft, nontender, nondistended. Bowel sounds present. No organomegaly or mass.  EXTREMITIES: No pedal edema, cyanosis, or clubbing.  NEUROLOGIC: Cranial nerves II through XII are intact. Muscle strength 5/5 in all extremities. Sensation intact. Gait not checked.  PSYCHIATRIC: The patient is alert and oriented x 3.  SKIN: No obvious rash, lesion, or ulcer.   LABORATORY PANEL:   CBC Recent Labs  Lab 10/17/17 2113  WBC 4.5  HGB 10.3*  HCT 32.3*  PLT 231  MCV 68.0*  MCH 21.6*  MCHC 31.8*  RDW 19.5*  LYMPHSABS 0.3*  MONOABS 0.1*  EOSABS 0.0  BASOSABS 0.0   ------------------------------------------------------------------------------------------------------------------  Chemistries  Recent Labs  Lab 10/17/17 2113  NA 135  K 4.3  CL 107  CO2 18*  GLUCOSE 202*  BUN 11  CREATININE 0.80  CALCIUM 9.0   ------------------------------------------------------------------------------------------------------------------ estimated creatinine clearance is 98.3 mL/min (by C-G formula based on SCr of 0.8 mg/dL). ------------------------------------------------------------------------------------------------------------------ No results for input(s): TSH, T4TOTAL, T3FREE, THYROIDAB in the last 72 hours.  Invalid input(s): FREET3   Coagulation profile No results for input(s): INR, PROTIME in the last 168 hours. ------------------------------------------------------------------------------------------------------------------- No results for input(s): DDIMER in the last 72 hours. -------------------------------------------------------------------------------------------------------------------  Cardiac Enzymes Recent Labs  Lab 10/17/17 2113 10/18/17 0030  TROPONINI <0.03 <0.03    ------------------------------------------------------------------------------------------------------------------ Invalid input(s): POCBNP  ---------------------------------------------------------------------------------------------------------------  Urinalysis    Component Value Date/Time   COLORURINE YELLOW 01/20/2017 0855   APPEARANCEUR CLEAR 01/20/2017 0855   LABSPEC 1.023 01/20/2017 0855   PHURINE 5.0 01/20/2017 0855   GLUCOSEU NEGATIVE 01/20/2017 0855   HGBUR NEGATIVE 01/20/2017 0855   BILIRUBINUR NEGATIVE 01/20/2017 0855   KETONESUR NEGATIVE 01/20/2017 0855   PROTEINUR NEGATIVE 01/20/2017 0855   UROBILINOGEN 0.2 05/24/2015 1800   NITRITE NEGATIVE 01/20/2017 0855   LEUKOCYTESUR NEGATIVE 01/20/2017 0855     RADIOLOGY: Dg Wrist Complete Right  Result Date: 10/17/2017 CLINICAL DATA:  Status post fall, with bilateral hand tingling and throbbing. Initial encounter. EXAM: RIGHT WRIST - COMPLETE 3+ VIEW COMPARISON:  None. FINDINGS: There is no evidence of fracture or dislocation. The carpal rows are intact, and demonstrate normal alignment. The joint spaces are preserved. No significant soft tissue abnormalities are seen. IMPRESSION: No evidence of fracture or dislocation. Electronically Signed   By: Garald Balding M.D.   On: 10/17/2017 22:28   Ct Head Wo Contrast  Result Date: 10/17/2017 CLINICAL DATA:  Initial evaluation for acute trauma, fall. EXAM: CT HEAD WITHOUT CONTRAST CT MAXILLOFACIAL WITHOUT CONTRAST TECHNIQUE: Multidetector CT imaging of the head and maxillofacial structures were performed using the standard protocol without intravenous contrast. Multiplanar CT image reconstructions of the maxillofacial structures were also generated. COMPARISON:  None. FINDINGS: CT HEAD FINDINGS Brain: Cerebral volume within normal limits. No acute intracranial hemorrhage. No acute large vessel territory infarct. No mass lesion, midline shift or mass effect. No hydrocephalus. No  extra-axial fluid collection. Vascular: No hyperdense vessel. Mild carotid siphon atherosclerosis. Skull: There is an apparent soft tissue contusion at the left frontoparietal scalp at the vertex. Calvarium intact. Other: Mastoid air  cells are clear. CT MAXILLOFACIAL FINDINGS Osseous: Zygomatic arches intact. Pterygoid plates intact. No acute nasal bone fracture. Nasal septum intact. There is loosening of the maxillary central incisors bilaterally, right greater than left (series 7, image 45). No other acute maxillary fracture. Alveolar ridge intact. No acute mandibular fracture. Mandibular condyles normally situated. Orbits: Globes and orbital soft tissues within normal limits. Bony orbits intact. Sinuses: Small right sphenoid sinus retention cyst noted. Minimal right maxillary sinus mucosal thickening. No hemosinus. Soft tissues: Small soft tissue irregularity at the upper lip, likely reflecting laceration. No other acute soft tissue abnormality. IMPRESSION: CT HEAD: 1. No acute intracranial process. 2. Question left frontoparietal scalp contusion at the vertex. No calvarial fracture. CT MAXILLOFACIAL: 1. Loosening/slight displacement of the bilateral maxillary central incisors, right greater than left. Overlying liver laceration. 2. No other acute maxillofacial injury.  No other fracture. Electronically Signed   By: Jeannine Boga M.D.   On: 10/17/2017 23:02   Ct Angio Chest Pe W And/or Wo Contrast  Result Date: 10/17/2017 CLINICAL DATA:  Status post fall, with loss of consciousness. Assess for pulmonary embolus. EXAM: CT ANGIOGRAPHY CHEST WITH CONTRAST TECHNIQUE: Multidetector CT imaging of the chest was performed using the standard protocol during bolus administration of intravenous contrast. Multiplanar CT image reconstructions and MIPs were obtained to evaluate the vascular anatomy. CONTRAST:  157mL ISOVUE-370 IOPAMIDOL (ISOVUE-370) INJECTION 76% COMPARISON:  Chest radiograph performed 10/26/2015  FINDINGS: Cardiovascular:  There is no evidence of pulmonary embolus. The heart is normal in size. The thoracic aorta is unremarkable. The great vessels are within normal limits. Mediastinum/Nodes: The mediastinum is unremarkable in appearance. No mediastinal lymphadenopathy is seen. No pericardial effusion is identified. The visualized portions of the thyroid gland are unremarkable. No axillary lymphadenopathy is seen. Lungs/Pleura: Minimal bibasilar atelectasis is noted. No pleural effusion or pneumothorax is seen. No masses are identified. Upper Abdomen: The visualized portions of the liver and spleen are unremarkable. The visualized portions of the pancreas, adrenal glands and kidneys are within normal limits. Musculoskeletal: No acute osseous abnormalities are identified. The visualized musculature is unremarkable in appearance. Review of the MIP images confirms the above findings. IMPRESSION: 1. No evidence of pulmonary embolus. 2. Minimal bibasilar atelectasis noted.  Lungs otherwise clear. Electronically Signed   By: Garald Balding M.D.   On: 10/17/2017 22:47   Ct Cervical Spine Wo Contrast  Result Date: 10/18/2017 CLINICAL DATA:  Syncope and fall EXAM: CT CERVICAL SPINE WITHOUT CONTRAST TECHNIQUE: Multidetector CT imaging of the cervical spine was performed without intravenous contrast. Multiplanar CT image reconstructions were also generated. COMPARISON:  None. FINDINGS: Alignment: No static subluxation. Facets are aligned. Occipital condyles and the lateral masses of C1 and C2 are normally approximated. Skull base and vertebrae: No acute fracture. Soft tissues and spinal canal: No prevertebral fluid or swelling. No visible canal hematoma. Disc levels: Disc herniations at C4-5 and C5-6 cause mild narrowing of the spinal canal. Mild-to-moderate neural foraminal narrowing is also present at these levels. Upper chest: No pneumothorax, pulmonary nodule or pleural effusion. Other: Normal visualized  paraspinal cervical soft tissues. IMPRESSION: 1. No acute fracture or static subluxation of the cervical spine. 2. 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 Electronically Signed   By: Ulyses Jarred M.D.   On: 10/18/2017 02:43   Dg Hand Complete Left  Result Date: 10/17/2017 CLINICAL DATA:  Acute onset of left hand tingling and throbbing, after fall. Initial encounter. EXAM: LEFT HAND - COMPLETE 3+ VIEW  COMPARISON:  None. FINDINGS: There is no evidence of fracture or dislocation. The joint spaces are preserved. The carpal rows are intact, and demonstrate normal alignment. The soft tissues are unremarkable in appearance. IMPRESSION: No evidence of fracture or dislocation. Electronically Signed   By: Garald Balding M.D.   On: 10/17/2017 22:26   Dg Hand Complete Right  Result Date: 10/17/2017 CLINICAL DATA:  Status post fall, with right hand tingling and throbbing. EXAM: RIGHT HAND - COMPLETE 3+ VIEW COMPARISON:  None. FINDINGS: There is no evidence of fracture or dislocation. The joint spaces are preserved. The carpal rows are intact, and demonstrate normal alignment. The soft tissues are unremarkable in appearance. IMPRESSION: No evidence of fracture or dislocation. Electronically Signed   By: Garald Balding M.D.   On: 10/17/2017 22:27   Ct Maxillofacial Wo Contrast  Result Date: 10/17/2017 CLINICAL DATA:  Initial evaluation for acute trauma, fall. EXAM: CT HEAD WITHOUT CONTRAST CT MAXILLOFACIAL WITHOUT CONTRAST TECHNIQUE: Multidetector CT imaging of the head and maxillofacial structures were performed using the standard protocol without intravenous contrast. Multiplanar CT image reconstructions of the maxillofacial structures were also generated. COMPARISON:  None. FINDINGS: CT HEAD FINDINGS Brain: Cerebral volume within normal limits. No acute intracranial hemorrhage. No acute large vessel territory infarct. No mass lesion, midline shift or mass effect. No  hydrocephalus. No extra-axial fluid collection. Vascular: No hyperdense vessel. Mild carotid siphon atherosclerosis. Skull: There is an apparent soft tissue contusion at the left frontoparietal scalp at the vertex. Calvarium intact. Other: Mastoid air cells are clear. CT MAXILLOFACIAL FINDINGS Osseous: Zygomatic arches intact. Pterygoid plates intact. No acute nasal bone fracture. Nasal septum intact. There is loosening of the maxillary central incisors bilaterally, right greater than left (series 7, image 45). No other acute maxillary fracture. Alveolar ridge intact. No acute mandibular fracture. Mandibular condyles normally situated. Orbits: Globes and orbital soft tissues within normal limits. Bony orbits intact. Sinuses: Small right sphenoid sinus retention cyst noted. Minimal right maxillary sinus mucosal thickening. No hemosinus. Soft tissues: Small soft tissue irregularity at the upper lip, likely reflecting laceration. No other acute soft tissue abnormality. IMPRESSION: CT HEAD: 1. No acute intracranial process. 2. Question left frontoparietal scalp contusion at the vertex. No calvarial fracture. CT MAXILLOFACIAL: 1. Loosening/slight displacement of the bilateral maxillary central incisors, right greater than left. Overlying liver laceration. 2. No other acute maxillofacial injury.  No other fracture. Electronically Signed   By: Jeannine Boga M.D.   On: 10/17/2017 23:02    EKG: Orders placed or performed during the hospital encounter of 10/17/17  . ED EKG  . ED EKG  . EKG 12-Lead  . EKG 12-Lead    IMPRESSION AND PLAN: 1 acute recurrent syncope Previous episode was due to panic attack Referred to the observation unit, check orthostatics, carotid Dopplers, echocardiogram, IV fluids for rehydration, rule out acute coronary syndrome with cardiac enzymes x3 sets, neuro checks per routine, check urine drug screen, and continue close medical monitoring  2 acute sinus tachycardia Exact  etiology unknown, ?  Mild dehydration IV fluids for rehydration, vitals per routine, make changes as per necessary  3 acute asthmatic exacerbation Stable Continue prednisone, breathing treatments as needed  4 history of anxiety Stable Vistaril as needed  5 obesity, chronic Most likely secondary to excess calories Lifestyle modification recommended  Full code Condition stable Prognosis good DVT prophylaxis with Lovenox subcu Disposition home tomorrow barring any complications   All the records are reviewed and case discussed with ED provider. Management  plans discussed with the patient, family and they are in agreement.  CODE STATUS:full Code Status History    Date Active Date Inactive Code Status Order ID Comments User Context   03/08/2013 07:24 03/09/2013 16:47 Full Code [id number]  Reyne Dumas, MD ED       TOTAL TIME TAKING CARE OF THIS PATIENT: 45 minutes.    Avel Peace Salary M.D on 10/18/2017   Between 7am to 6pm - Pager - [phone]  After 6pm go to [email or web address] - password [email or web address] [facility]  Office  [phone]  CC: Primary care physician; Patient, No Pcp Per   Note: This dictation was prepared with Dragon dictation along with smaller phrase technology. Any transcriptional errors that result from this process are unintentional.

## 2017-10-18 NOTE — ED Notes (Signed)
Blood cleaned off patient and adjusted patients pillows.

## 2017-10-18 NOTE — Progress Notes (Signed)
Pt alert and oriented x 4. Orders to transfer to [facility]. Communicated with Dr Darvin Neighbours and Dr Jerelyn Charles. Flight team (3 members) just left to transport pt to [facility]. ICU room 9. Report given to accepting RN. Veronica. Personal belongings given to family except pt purse which she took with her. Norco 2 tabs given prior to transport for pain in her hands bilateral 10/10. Daughter given information of pt room # at [facility].

## 2017-10-19 DIAGNOSIS — M79601 Pain in right arm: Secondary | ICD-10-CM | POA: Insufficient documentation

## 2017-10-19 DIAGNOSIS — M5020 Other cervical disc displacement, unspecified cervical region: Secondary | ICD-10-CM | POA: Insufficient documentation

## 2017-10-19 DIAGNOSIS — S14129A Central cord syndrome at unspecified level of cervical spinal cord, initial encounter: Secondary | ICD-10-CM | POA: Insufficient documentation

## 2017-10-19 DIAGNOSIS — M4802 Spinal stenosis, cervical region: Secondary | ICD-10-CM | POA: Insufficient documentation

## 2017-10-19 LAB — RPR: RPR: NONREACTIVE

## 2017-10-19 LAB — ANA COMPREHENSIVE PANEL
CHROMATIN AB SERPL-ACNC: 0.2 AI (ref 0.0–0.9)
Centromere Ab Screen: <0.2 AI (ref 0.0–0.9)
DS DNA AB: 2 [IU]/mL (ref 0–9)
ENA SM Ab Ser-aCnc: <0.2 AI (ref 0.0–0.9)
Ribonucleic Protein: <0.2 AI (ref 0.0–0.9)
SSA (Ro) (ENA) Antibody, IgG: <0.2 AI (ref 0.0–0.9)
SSB (La) (ENA) Antibody, IgG: <0.2 AI (ref 0.0–0.9)

## 2017-10-19 LAB — HIV ANTIBODY (ROUTINE TESTING W REFLEX): HIV Screen 4th Generation wRfx: NONREACTIVE

## 2017-10-19 LAB — RHEUMATOID FACTOR: Rhuematoid fact SerPl-aCnc: <10 IU/mL (ref 0.0–13.9)

## 2017-10-20 LAB — URINE DRUGS OF ABUSE SCREEN W ALC, ROUTINE (REF LAB)
AMPHETAMINES, URINE: NEGATIVE ng/mL
BARBITURATE, UR: NEGATIVE ng/mL
BENZODIAZEPINE QUANT UR: NEGATIVE ng/mL
Cannabinoid Quant, Ur: NEGATIVE ng/mL
Cocaine (Metab.): NEGATIVE ng/mL
Ethanol U, Quan: NEGATIVE %
Methadone Screen, Urine: NEGATIVE ng/mL
Phencyclidine, Ur: NEGATIVE ng/mL
Propoxyphene, Urine: NEGATIVE ng/mL

## 2017-10-20 LAB — OPIATES CONFIRMATION, URINE
CODEINE: NEGATIVE
MORPHINE: POSITIVE — AB
OPIATES: POSITIVE — AB

## 2017-10-22 DIAGNOSIS — D500 Iron deficiency anemia secondary to blood loss (chronic): Secondary | ICD-10-CM | POA: Insufficient documentation

## 2017-10-22 DIAGNOSIS — J4521 Mild intermittent asthma with (acute) exacerbation: Secondary | ICD-10-CM | POA: Insufficient documentation

## 2019-02-18 ENCOUNTER — Other Ambulatory Visit: Payer: Self-pay

## 2019-02-18 ENCOUNTER — Encounter (HOSPITAL_COMMUNITY): Payer: Self-pay

## 2019-02-18 ENCOUNTER — Ambulatory Visit (HOSPITAL_COMMUNITY)
Admission: EM | Admit: 2019-02-18 | Discharge: 2019-02-18 | Disposition: A | Discharge status: Home / Self Care | Payer: PRIVATE HEALTH INSURANCE | Attending: Family Medicine | Admitting: Family Medicine

## 2019-02-18 DIAGNOSIS — R1084 Generalized abdominal pain: Secondary | ICD-10-CM | POA: Diagnosis not present

## 2019-02-18 LAB — POCT URINALYSIS DIP (DEVICE)
Bilirubin Urine: NEGATIVE
Glucose, UA: NEGATIVE mg/dL
Hgb urine dipstick: NEGATIVE
Ketones, ur: NEGATIVE mg/dL
Leukocytes,Ua: NEGATIVE
Nitrite: NEGATIVE
Protein, ur: NEGATIVE mg/dL
Specific Gravity, Urine: >=1.03 (ref 1.005–1.030)
Urobilinogen, UA: 0.2 mg/dL (ref 0.0–1.0)
pH: 5.5 (ref 5.0–8.0)

## 2019-02-18 LAB — POCT PREGNANCY, URINE: Preg Test, Ur: NEGATIVE

## 2019-02-18 NOTE — Discharge Instructions (Addendum)
Your urine did not show any infection.  Your pregnancy test was negative. We will send a swab for testing You could try some MiraLAX to see if this helps with your symptoms If your pain gets worse you need to be rechecked, most likely in the ER to get imaging

## 2019-02-18 NOTE — ED Triage Notes (Signed)
Pt states she has a UTI. X 3 days. Pt states she has tried AZO.

## 2019-02-18 NOTE — ED Provider Notes (Signed)
[facility]    CSN: [id number] Arrival date & time: 02/18/19  1336     History   Chief Complaint Chief Complaint  Patient presents with  . Urinary Tract Infection    HPI Alexis Beck is a 43 y.o. female.   Patient is a 43 year old female past medical history of anxiety, asthma, bipolar, depression.  She is presenting today with concerns for UTI.  She has had lower abdominal discomfort, pressure and lower back pain over the past 3 days.  Symptoms have been intermittent, waxing and waning.  Reporting symptoms increasing at nighttime.  Mild dysuria. No urinary frequency, hematuria, vaginal discharge, itching, irritation or odor.  She is currently sexually active with one partner.  Denies any concern for STDs. Patient's last menstrual period was 02/03/2019. Also reporting hx of constipation. LBM this am and normal.   ROS per HPI       Past Medical History:  Diagnosis Date  . Anxiety   . Asthma   . Bipolar disorder ([place])   . Depression     Patient Active Problem List   Diagnosis Date Noted  . Syncope and collapse 10/18/2017  . 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 Asthma 03/08/2013  . Depression 02/12/2012  . Anxiety 02/12/2012    Past Surgical History:  Procedure Laterality Date  . NECK SURGERY  10/2017  . TUBAL LIGATION  2003    OB History    Gravida  4   Para  3   Term  3   Preterm      AB  1   Living  3     SAB      TAB  1   Ectopic      Multiple      Live Births               Home Medications    Prior to Admission medications   Medication Sig Start Date End Date Taking? Authorizing Provider  albuterol (PROVENTIL HFA;VENTOLIN HFA) 108 (90 Base) MCG/ACT inhaler Inhale 1-2 puffs into the lungs every 6 (six) hours as needed for wheezing or shortness of breath.    [provider]  cetirizine (ZYRTEC) 10 MG tablet Take 1 tablet (10 mg total) by mouth daily. 10/17/17   Maczis, Barth Kirks, PA-C  fluticasone (FLONASE) 50 MCG/ACT  nasal spray Place 2 sprays into both nostrils daily. 10/17/17   Maczis, Barth Kirks, PA-C    Family History Family History  Problem Relation Age of Onset  . Drug abuse Mother   . Drug abuse Father   . Depression Brother   . Depression Maternal Grandmother   . Schizophrenia Cousin     Social History Social History   Tobacco Use  . Smoking status: Never Smoker  . Smokeless tobacco: Never Used  Substance Use Topics  . Alcohol use: No  . Drug use: No     Allergies   Shellfish allergy   Review of Systems Review of Systems   Physical Exam Triage Vital Signs ED Triage Vitals  Enc Vitals Group     BP 02/18/19 1401 123/79     Pulse --      Resp 02/18/19 1401 18     Temp 02/18/19 1401 98.8 F (37.1 C)     Temp Source 02/18/19 1401 Oral     SpO2 02/18/19 1401 100 %     Weight 02/18/19 1359 203 lb (92.1 kg)     Height --      Head Circumference --  Peak Flow --      Pain Score 02/18/19 1359 1     Pain Loc --      Pain Edu? --      Excl. in [place]? --    No data found.  Updated Vital Signs BP 123/79 (BP Location: Right Arm)   Temp 98.8 F (37.1 C) (Oral)   Resp 18   Wt 203 lb (92.1 kg)   LMP 02/03/2019   SpO2 100%   BMI 33.78 kg/m   Visual Acuity Right Eye Distance:   Left Eye Distance:   Bilateral Distance:    Right Eye Near:   Left Eye Near:    Bilateral Near:     Physical Exam Vitals signs and nursing note reviewed.  Constitutional:      Appearance: Normal appearance.  HENT:     Head: Normocephalic and atraumatic.     Nose: Nose normal.  Eyes:     Conjunctiva/sclera: Conjunctivae normal.  Neck:     Musculoskeletal: Normal range of motion.  Pulmonary:     Effort: Pulmonary effort is normal.  Abdominal:     General: Bowel sounds are normal.     Palpations: Abdomen is soft.     Tenderness: There is abdominal tenderness in the right lower quadrant and left lower quadrant.     Hernia: No hernia is present.     Comments: Generalized lower  abdominal discomfort No guarding or rebound  Musculoskeletal: Normal range of motion.  Skin:    General: Skin is warm and dry.  Neurological:     Mental Status: She is alert.  Psychiatric:        Mood and Affect: Mood normal.      UC Treatments / Results  Labs (all labs ordered are listed, but only abnormal results are displayed) Labs Reviewed  POC URINE PREG, ED  POCT URINALYSIS DIP (DEVICE)  POCT PREGNANCY, URINE  CERVICOVAGINAL ANCILLARY ONLY    EKG   Radiology No results found.  Procedures Procedures (including critical care time)  Medications Ordered in UC Medications - No data to display  Initial Impression / Assessment and Plan / UC Course  I have reviewed the triage vital signs and the nursing notes.  Pertinent labs & imaging results that were available during my care of the patient were reviewed by me and considered in my medical decision making (see chart for details).    43 year old female with generalized lower abdominal discomfort is been coming on for the past couple days. Differentials include UTI, STI, BV, yeast, constipation, ovarian cyst No acute abdomen on exam. Urine negative for infection or pregnancy. Will send swab for STD testing MiraLAX to treat for constipation to see if this helps.  Recommended follow-up with OB/GYN If symptoms become more severe she will need to go to the ER. Pt understanding and agreed.   Final Clinical Impressions(s) / UC Diagnoses   Final diagnoses:  Generalized abdominal pain     Discharge Instructions     Your urine did not show any infection.  Your pregnancy test was negative. We will send a swab for testing You could try some MiraLAX to see if this helps with your symptoms If your pain gets worse you need to be rechecked, most likely in the ER to get imaging    ED Prescriptions    None     Controlled Substance Prescriptions [place] Controlled Substance Registry consulted? No   Orvan July, NP  02/18/19 1524

## 2019-02-19 LAB — CERVICOVAGINAL ANCILLARY ONLY
Bacterial vaginitis: NEGATIVE
Candida vaginitis: NEGATIVE
Chlamydia: NEGATIVE
Neisseria Gonorrhea: NEGATIVE
Trichomonas: NEGATIVE

## 2019-11-02 ENCOUNTER — Encounter (HOSPITAL_COMMUNITY): Payer: Self-pay

## 2019-11-02 ENCOUNTER — Other Ambulatory Visit: Payer: Self-pay

## 2019-11-02 ENCOUNTER — Emergency Department (HOSPITAL_COMMUNITY): Payer: Self-pay

## 2019-11-02 ENCOUNTER — Emergency Department (HOSPITAL_COMMUNITY)
Admission: EM | Admit: 2019-11-02 | Discharge: 2019-11-02 | Disposition: A | Discharge status: Home / Self Care | Payer: Self-pay | Attending: Emergency Medicine | Admitting: Emergency Medicine

## 2019-11-02 DIAGNOSIS — R1031 Right lower quadrant pain: Secondary | ICD-10-CM | POA: Insufficient documentation

## 2019-11-02 DIAGNOSIS — Z9851 Tubal ligation status: Secondary | ICD-10-CM | POA: Insufficient documentation

## 2019-11-02 DIAGNOSIS — N10 Acute tubulo-interstitial nephritis: Secondary | ICD-10-CM | POA: Insufficient documentation

## 2019-11-02 DIAGNOSIS — N12 Tubulo-interstitial nephritis, not specified as acute or chronic: Secondary | ICD-10-CM

## 2019-11-02 DIAGNOSIS — D259 Leiomyoma of uterus, unspecified: Secondary | ICD-10-CM | POA: Insufficient documentation

## 2019-11-02 DIAGNOSIS — D649 Anemia, unspecified: Secondary | ICD-10-CM | POA: Insufficient documentation

## 2019-11-02 LAB — BASIC METABOLIC PANEL
Anion gap: 9 (ref 5–15)
BUN: 11 mg/dL (ref 6–20)
CO2: 24 mmol/L (ref 22–32)
Calcium: 9.1 mg/dL (ref 8.9–10.3)
Chloride: 105 mmol/L (ref 98–111)
Creatinine, Ser: 0.84 mg/dL (ref 0.44–1.00)
GFR calc Af Amer: >60 mL/min (ref >60)
GFR calc non Af Amer: >60 mL/min (ref >60)
Glucose, Bld: 110 mg/dL — ABNORMAL HIGH (ref 70–99)
Potassium: 4.1 mmol/L (ref 3.5–5.1)
Sodium: 138 mmol/L (ref 135–145)

## 2019-11-02 LAB — CBC
HCT: 34.5 % — ABNORMAL LOW (ref 36.0–46.0)
Hemoglobin: 10.1 g/dL — ABNORMAL LOW (ref 12.0–15.0)
MCH: 23.9 pg — ABNORMAL LOW (ref 26.0–34.0)
MCHC: 29.3 g/dL — ABNORMAL LOW (ref 30.0–36.0)
MCV: 81.8 fL (ref 80.0–100.0)
Platelets: 256 10*3/uL (ref 150–400)
RBC: 4.22 MIL/uL (ref 3.87–5.11)
RDW: 16.6 % — ABNORMAL HIGH (ref 11.5–15.5)
WBC: 8.6 10*3/uL (ref 4.0–10.5)
nRBC: 0 % (ref 0.0–0.2)

## 2019-11-02 LAB — URINALYSIS, ROUTINE W REFLEX MICROSCOPIC
Bacteria, UA: NONE SEEN
Bilirubin Urine: NEGATIVE
Glucose, UA: NEGATIVE mg/dL
Ketones, ur: NEGATIVE mg/dL
Nitrite: NEGATIVE
Protein, ur: NEGATIVE mg/dL
Specific Gravity, Urine: 1.001 — ABNORMAL LOW (ref 1.005–1.030)
pH: 6 (ref 5.0–8.0)

## 2019-11-02 LAB — HEPATIC FUNCTION PANEL
ALT: 19 U/L (ref 0–44)
AST: 39 U/L (ref 15–41)
Albumin: 3.9 g/dL (ref 3.5–5.0)
Alkaline Phosphatase: 36 U/L — ABNORMAL LOW (ref 38–126)
Bilirubin, Direct: 0.3 mg/dL — ABNORMAL HIGH (ref 0.0–0.2)
Indirect Bilirubin: 0.1 mg/dL — ABNORMAL LOW (ref 0.3–0.9)
Total Bilirubin: 0.4 mg/dL (ref 0.3–1.2)
Total Protein: 6.6 g/dL (ref 6.5–8.1)

## 2019-11-02 LAB — LIPASE, BLOOD: Lipase: 31 U/L (ref 11–51)

## 2019-11-02 LAB — I-STAT BETA HCG BLOOD, ED (MC, WL, AP ONLY): I-stat hCG, quantitative: <5 m[IU]/mL (ref <5)

## 2019-11-02 MED ORDER — KETOROLAC TROMETHAMINE 15 MG/ML IJ SOLN
15.0000 mg | Freq: Once | INTRAMUSCULAR | Status: AC
  Administered 2019-11-02: 15 mg via INTRAVENOUS
  Filled 2019-11-02: qty 1

## 2019-11-02 MED ORDER — CEPHALEXIN 500 MG PO CAPS: 500.0000 mg | ORAL_CAPSULE | Freq: Four times a day (QID) | ORAL | 0 refills | Status: DC

## 2019-11-02 MED ORDER — SODIUM CHLORIDE 0.9 % IV SOLN
1.0000 g | Freq: Once | INTRAVENOUS | Status: AC
  Administered 2019-11-02: 10:00:00 1 g via INTRAVENOUS
  Filled 2019-11-02: qty 10

## 2019-11-02 MED ORDER — NAPROXEN 500 MG PO TABS: 500.0000 mg | ORAL_TABLET | Freq: Two times a day (BID) | ORAL | 0 refills | Status: DC

## 2019-11-02 MED ORDER — ONDANSETRON 4 MG PO TBDP: 4.0000 mg | ORAL_TABLET | Freq: Three times a day (TID) | ORAL | 0 refills | Status: DC | PRN

## 2019-11-02 NOTE — Discharge Instructions (Addendum)
You were seen in the emergency department today for abdominal discomfort and urinary symptoms.  Your labs were overall reassuring, you do have some anemia which is similar to your prior labs on record.  Your CT scan did not show any kidney stones or problems with your appendix, it did show that you have uterine fibroids.  Your urine appeared somewhat infected, we are going to treat you for a kidney infection with Keflex, this is an antibiotic, please take this as prescribed.  We are also sending home with naproxen and Zofran to help with symptomatic relief.  - Naproxen is a nonsteroidal anti-inflammatory medication that will help with pain and swelling. Be sure to take this medication as prescribed with food, 1 pill every 12 hours,  It should be taken with food, as it can cause stomach upset, and more seriously, stomach bleeding. Do not take other nonsteroidal anti-inflammatory medications with this such as Advil, Motrin, Aleve, Mobic, Goodie Powder, or Motrin.    - Zofran-this is an antinausea medication you may take every 8 hours as needed for nausea and vomiting.  You make take Tylenol per over the counter dosing with these medications.   We have prescribed you new medication(s) today. Discuss the medications prescribed today with your pharmacist as they can have adverse effects and interactions with your other medicines including over the counter and prescribed medications. Seek medical evaluation if you start to experience new or abnormal symptoms after taking one of these medicines, seek care immediately if you start to experience difficulty breathing, feeling of your throat closing, facial swelling, or rash as these could be indications of a more serious allergic reaction  Please follow-up with your primary care provider within 5 days for reevaluation.  Return to the emergency department for new or worsening symptoms including but not limited to worsening pain, fever, inability to keep fluids down,  vaginal discharge, change in type of pain, passing out, chest pain, trouble breathing, or any other concerns.

## 2019-11-02 NOTE — ED Notes (Signed)
Patient verbalizes understanding of discharge instructions. Opportunity for questioning and answers were provided. Armband removed by staff, pt discharged from ED.  

## 2019-11-02 NOTE — ED Provider Notes (Signed)
[place] EMERGENCY DEPARTMENT Provider Note   CSN: [id number] Arrival date & time: 11/02/19  0101     History Chief Complaint  Patient presents with  . Dysuria    Alexis Beck is a 44 y.o. female with a history of anxiety, depression, bipolar disorder, asthma, & prior tubal ligation who presents to the ED with complaints of urinary sxs and abdominal discomfort that began @ 20:00 last night. Patient reports urinary frequency, urgency, and dysuria, states initially was only urinating small amounts each time but then started to have more large volume urination. Reports associated abdominal discomfort that is somewhat generalized achy but then with sharp increases in pain in the RLQ that are intermittent. Current pain is a 7/10 in severity. No alleviating/aggravating factors specifically. Had some lower back pain, nausea, and chills earlier which are now resolved. Denies fever, emesis, diarrhea, melena, hematochezia, hematuria, vaginal bleeding, or vaginal discharge. She is currently sexually active in a monogamous relationship without concern for STD. Had somewhat more mild similar sxs a few months ago & was seen @ urgent care- seemed to resolve and was told her urine looked okay.   HPI     Past Medical History:  Diagnosis Date  . Anxiety   . Asthma   . Bipolar disorder ([place])   . Depression     Patient Active Problem List   Diagnosis Date Noted  . Syncope and collapse 10/18/2017  . 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 Asthma 03/08/2013  . Depression 02/12/2012  . Anxiety 02/12/2012    Past Surgical History:  Procedure Laterality Date  . NECK SURGERY  10/2017  . TUBAL LIGATION  2003     OB History    Gravida  4   Para  3   Term  3   Preterm      AB  1   Living  3     SAB      TAB  1   Ectopic      Multiple      Live Births              Family History  Problem Relation Age of Onset  . Drug abuse Mother   . Drug abuse Father   .  Depression Brother   . Depression Maternal Grandmother   . Schizophrenia Cousin     Social History   Tobacco Use  . Smoking status: Never Smoker  . Smokeless tobacco: Never Used  Substance Use Topics  . Alcohol use: No  . Drug use: No    Home Medications Prior to Admission medications   Medication Sig Start Date End Date Taking? Authorizing Provider  albuterol (PROVENTIL HFA;VENTOLIN HFA) 108 (90 Base) MCG/ACT inhaler Inhale 1-2 puffs into the lungs every 6 (six) hours as needed for wheezing or shortness of breath.    [provider]  cetirizine (ZYRTEC) 10 MG tablet Take 1 tablet (10 mg total) by mouth daily. 10/17/17   Maczis, Barth Kirks, PA-C  fluticasone (FLONASE) 50 MCG/ACT nasal spray Place 2 sprays into both nostrils daily. 10/17/17   Maczis, Barth Kirks, PA-C    Allergies    Shellfish allergy  Review of Systems   Review of Systems  Constitutional: Positive for chills. Negative for fever.  Respiratory: Negative for cough and shortness of breath.   Cardiovascular: Negative for chest pain.  Gastrointestinal: Positive for abdominal pain and nausea. Negative for blood in stool, constipation, diarrhea and vomiting.  Genitourinary: Positive for dysuria, frequency and urgency.  Negative for vaginal bleeding and vaginal discharge.  Neurological: Negative for syncope.  All other systems reviewed and are negative.   Physical Exam Updated Vital Signs BP 109/61 (BP Location: Right Arm)   Pulse 71   Temp 98.7 F (37.1 C) (Oral)   Resp 18   SpO2 100%   Physical Exam Vitals and nursing note reviewed.  Constitutional:      General: She is not in acute distress.    Appearance: She is well-developed. She is not toxic-appearing.  HENT:     Head: Normocephalic and atraumatic.  Eyes:     General:        Right eye: No discharge.        Left eye: No discharge.     Conjunctiva/sclera: Conjunctivae normal.  Cardiovascular:     Rate and Rhythm: Normal rate and regular  rhythm.  Pulmonary:     Effort: Pulmonary effort is normal. No respiratory distress.     Breath sounds: Normal breath sounds. No wheezing, rhonchi or rales.  Abdominal:     General: There is no distension.     Palpations: Abdomen is soft.     Tenderness: There is abdominal tenderness (mild generalized, most prominent in the RLQ). There is no right CVA tenderness, left CVA tenderness, guarding or rebound. Negative signs include Murphy's sign, Rovsing's sign, psoas sign and obturator sign.  Musculoskeletal:     Cervical back: Neck supple.  Skin:    General: Skin is warm and dry.     Findings: No rash.  Neurological:     Mental Status: She is alert.     Comments: Clear speech.   Psychiatric:        Behavior: Behavior normal.     ED Results / Procedures / Treatments   Labs (all labs ordered are listed, but only abnormal results are displayed) Labs Reviewed  URINALYSIS, ROUTINE W REFLEX MICROSCOPIC - Abnormal; Notable for the following components:      Result Value   Color, Urine COLORLESS (*)    Specific Gravity, Urine 1.001 (*)    Hgb urine dipstick MODERATE (*)    Leukocytes,Ua LARGE (*)    All other components within normal limits  BASIC METABOLIC PANEL - Abnormal; Notable for the following components:   Glucose, Bld 110 (*)    All other components within normal limits  CBC - Abnormal; Notable for the following components:   Hemoglobin 10.1 (*)    HCT 34.5 (*)    MCH 23.9 (*)    MCHC 29.3 (*)    RDW 16.6 (*)    All other components within normal limits  LIPASE, BLOOD  I-STAT BETA HCG BLOOD, ED ([facility], [facility], [facility] ONLY)    EKG None  Radiology CT Renal Stone Study  Result Date: 11/02/2019 CLINICAL DATA:  Abdominal pain, right lower quadrant pain EXAM: CT ABDOMEN AND PELVIS WITHOUT CONTRAST TECHNIQUE: Multidetector CT imaging of the abdomen and pelvis was performed following the standard protocol without IV contrast. COMPARISON:  None. FINDINGS: Lower chest: No acute  abnormality. Hepatobiliary: No focal liver abnormality is seen. No gallstones, gallbladder wall thickening, or biliary dilatation. Pancreas: Unremarkable. No pancreatic ductal dilatation or surrounding inflammatory changes. Spleen: Normal in size without focal abnormality. Adrenals/Urinary Tract: Adrenal glands are unremarkable. Kidneys are normal, without renal calculi, focal lesion, or hydronephrosis. Bladder is unremarkable. Stomach/Bowel: Stomach is within normal limits. Appendix appears normal. No evidence of bowel wall thickening, distention, or inflammatory changes. Diverticulosis without evidence of diverticulitis. Vascular/Lymphatic: Normal caliber  abdominal aorta with mild atherosclerosis. No lymphadenopathy. Reproductive: Enlarged uterus. Large 5.6 cm anterior uterine mass consistent with a fibroid. No other adnexal mass. Other: No abdominal wall hernia or abnormality. No abdominopelvic ascites. Musculoskeletal: No acute osseous abnormality. No aggressive osseous lesion. Degenerative disease with disc height loss at L4-5. Broad-based disc osteophyte complex at L4-5. IMPRESSION: 1. No acute abdominal or pelvic pathology. 2. Normal appendix. 3. Diverticulosis without evidence of diverticulitis. 4. Fibroid uterus. 5.  Aortic Atherosclerosis (ICD10-I70.0). Electronically Signed   By: Kathreen Devoid   On: 11/02/2019 07:40    Procedures Procedures (including critical care time)  Medications Ordered in ED Medications - No data to display  ED Course  I have reviewed the triage vital signs and the nursing notes.  Pertinent labs & imaging results that were available during my care of the patient were reviewed by me and considered in my medical decision making (see chart for details).    MDM Rules/Calculators/A&P                      Patient presents to the ED with complaints or urinary sxs & abdominal pain.  She is nontoxic appearing, resting comfortably, vitals without significant abnormality.  Abdomen has some very mild generalized tenderness with increased RLQ tenderness, no peritoneal signs. No suprapubic tenderness. Discussed pelvic exam, patient declined, she is in a monogamous relationship & without discharge/bleeding, I do not have any high suspicion for PID currently. DDX: UTI/pyelo, nephrolithiasis, appendicitis, MSK, obstruction, perforation, cholecystitis, cholelithiasis.   Labs reviewed & interpreted:  Pregnancy test: Negative CBC: Anemia similar to prior. No leukocytosis.  BMP: No significant electrolyte derangement. Renal function preserved.  Lipase: WNL UA: Large leukocytes, moderate hgb. Sent for culture.   CT renal stone study obtained:   1. No acute abdominal or pelvic pathology. 2. Normal appendix. 3. Diverticulosis without evidence of diverticulitis. 4. Fibroid uterus. 5.  Aortic Atherosclerosis (ICD10-I70.0)  Reassuring imaging- no signs of appendicitis or kidney sotne, repeat abdominal exam remains without peritoneal signs. Will treat for pyelo given some signs of infection on UA with urinary sxs and back pain. Patient is not septic appearing, does not appear to require admission at this time, appears appropriate for outpatient abx & symptomatic control. Given Rocephin & toradol in the ED, feeling better & ready to go home. I discussed results, treatment plan, need for follow-up, and return precautions with the patient. Provided opportunity for questions, patient confirmed understanding and is in agreement with plan.     Final Clinical Impression(s) / ED Diagnoses Final diagnoses:  Pyelonephritis  Anemia, unspecified type  Uterine leiomyoma, unspecified location    Rx / DC Orders ED Discharge Orders         Ordered    cephALEXin (KEFLEX) 500 MG capsule  4 times daily     11/02/19 1042    naproxen (NAPROSYN) 500 MG tablet  2 times daily     11/02/19 1042    ondansetron (ZOFRAN ODT) 4 MG disintegrating tablet  Every 8 hours PRN     11/02/19 1042            Haislee Corso R, PA-C 11/02/19 1520    Ward, [employer], DO 11/02/19 2308

## 2019-11-02 NOTE — ED Triage Notes (Addendum)
Pt arrives to ED w/ c/o dysuria. States she is able to go, however, voiding "does not feel normal." Pt endorses back and flank pain yesterday but states that has resolved. Pt denies hematuria. Pt reports generalized abdominal discomfort 5/10.

## 2019-11-03 LAB — URINE CULTURE: Culture: NO GROWTH

## 2019-11-12 NOTE — Progress Notes (Signed)
Patient ID: Alexis Beck, female   DOB: 12/24/1975, 44 y.o.   MRN: [id number]  Virtual Visit via Telephone Note  I connected with Webb Laws on 11/13/19 at  1:30 PM EDT by telephone and verified that I am speaking with the correct person using two identifiers.   I discussed the limitations, risks, security and privacy concerns of performing an evaluation and management service by telephone and the availability of in person appointments. I also discussed with the patient that there may be a patient responsible charge related to this service. The patient expressed understanding and agreed to proceed.  PATIENT visit by telephone virtually in the context of Covid-19 pandemic. Patient location:  home My Location:  [facility] office Persons on the call:  Me and the patient  History of Present Illness: After ED visit 11/02/2019 and was covered for pyelo.  Urine culture came back negative.  Patient was given an injection of rocephin and oral cephalexin and has completed the course.  She would like a RF of Zofran for occasional nausea but feels great overall.  No abdominal pain.  She does think she is getting a yeast infection from the antibiotics.  She takes iron ~5 times a week for anemia.  Blood sugar was high(110) in ED.  Denies polyuria/polydipsia.  No glucose in urine at ED.  From A/P: Patient presents to the ED with complaints or urinary sxs & abdominal pain.  She is nontoxic appearing, resting comfortably, vitals without significant abnormality. Abdomen has some very mild generalized tenderness with increased RLQ tenderness, no peritoneal signs. No suprapubic tenderness. Discussed pelvic exam, patient declined, she is in a monogamous relationship & without discharge/bleeding, I do not have any high suspicion for PID currently. DDX: UTI/pyelo, nephrolithiasis, appendicitis, MSK, obstruction, perforation, cholecystitis, cholelithiasis.   Labs reviewed & interpreted:  Pregnancy test: Negative CBC:  Anemia similar to prior. No leukocytosis.  BMP: No significant electrolyte derangement. Renal function preserved.  Lipase: WNL UA: Large leukocytes, moderate hgb. Sent for culture.   CT renal stone study obtained:   1. No acute abdominal or pelvic pathology. 2. Normal appendix. 3. Diverticulosis without evidence of diverticulitis. 4. Fibroid uterus. 5.  Aortic Atherosclerosis (ICD10-I70.0)  Reassuring imaging- no signs of appendicitis or kidney sotne, repeat abdominal exam remains without peritoneal signs. Will treat for pyelo given some signs of infection on UA with urinary sxs and back pain. Patient is not septic appearing, does not appear to require admission at this time, appears appropriate for outpatient abx & symptomatic control. Given Rocephin & toradol in the ED, feeling better & ready to go home. I discussed results, treatment plan, need for follow-up, and return precautions with the patient. Provided opportunity for questions, patient confirmed understanding and is in agreement with plan.     Observations/Objective: NAD.  A&Ox3   Assessment and Plan: 1. Nausea Occasional/intermittent/not currently - ondansetron (ZOFRAN ODT) 4 MG disintegrating tablet; Take 1 tablet (4 mg total) by mouth every 8 (eight) hours as needed for nausea or vomiting.  Dispense: 20 tablet; Refill: 0  2. Hyperglycemia I have had a lengthy discussion and provided education about insulin resistance and the intake of too much sugar/refined carbohydrates.  I have advised the patient to work at a goal of eliminating sugary drinks, candy, desserts, sweets, refined sugars, processed foods, and white carbohydrates.  The patient expresses understanding.  -appt 2:30pm monday - Hemoglobin A1c; Future  3. Yeast infection - fluconazole (DIFLUCAN) 150 MG tablet; Take 1 tablet (150 mg total) by mouth  once for 1 dose.  Dispense: 1 tablet; Refill: 0  4.  Hospital follow up-doing well! Drink 80-100 ounces water  daily  Follow Up Instructions: Assign PCP in 6 weeks   I discussed the assessment and treatment plan with the patient. The patient was provided an opportunity to ask questions and all were answered. The patient agreed with the plan and demonstrated an understanding of the instructions.   The patient was advised to call back or seek an in-person evaluation if the symptoms worsen or if the condition fails to improve as anticipated.  I provided 13 minutes of non-face-to-face time during this encounter.   Freeman Caldron, PA-C

## 2019-11-13 ENCOUNTER — Ambulatory Visit: Payer: PRIVATE HEALTH INSURANCE | Attending: Family Medicine | Admitting: Physician Assistant

## 2019-11-13 ENCOUNTER — Other Ambulatory Visit: Payer: Self-pay

## 2019-11-13 DIAGNOSIS — Z09 Encounter for follow-up examination after completed treatment for conditions other than malignant neoplasm: Secondary | ICD-10-CM

## 2019-11-13 DIAGNOSIS — R110 Nausea: Secondary | ICD-10-CM

## 2019-11-13 DIAGNOSIS — B379 Candidiasis, unspecified: Secondary | ICD-10-CM

## 2019-11-13 DIAGNOSIS — R739 Hyperglycemia, unspecified: Secondary | ICD-10-CM

## 2019-11-13 MED ORDER — ONDANSETRON 4 MG PO TBDP: 4.0000 mg | ORAL_TABLET | Freq: Three times a day (TID) | ORAL | 0 refills | Status: DC | PRN

## 2019-11-13 MED ORDER — FLUCONAZOLE 150 MG PO TABS: 150.0000 mg | ORAL_TABLET | Freq: Once | ORAL | 0 refills | Status: AC

## 2019-11-17 ENCOUNTER — Other Ambulatory Visit: Payer: PRIVATE HEALTH INSURANCE

## 2019-11-17 ENCOUNTER — Ambulatory Visit: Payer: Self-pay | Attending: Family Medicine

## 2019-11-17 ENCOUNTER — Other Ambulatory Visit: Payer: Self-pay

## 2019-11-17 DIAGNOSIS — R739 Hyperglycemia, unspecified: Secondary | ICD-10-CM

## 2019-11-18 LAB — HEMOGLOBIN A1C
Est. average glucose Bld gHb Est-mCnc: 134 mg/dL
Hgb A1c MFr Bld: 6.3 % — ABNORMAL HIGH (ref 4.8–5.6)

## 2020-05-29 ENCOUNTER — Other Ambulatory Visit: Payer: Self-pay

## 2020-05-29 ENCOUNTER — Emergency Department (HOSPITAL_COMMUNITY)
Admission: EM | Admit: 2020-05-29 | Discharge: 2020-05-29 | Disposition: A | Discharge status: Home / Self Care | Payer: Self-pay | Attending: Emergency Medicine | Admitting: Emergency Medicine

## 2020-05-29 DIAGNOSIS — N3001 Acute cystitis with hematuria: Secondary | ICD-10-CM | POA: Insufficient documentation

## 2020-05-29 DIAGNOSIS — J45909 Unspecified asthma, uncomplicated: Secondary | ICD-10-CM | POA: Insufficient documentation

## 2020-05-29 LAB — URINALYSIS, ROUTINE W REFLEX MICROSCOPIC
Bilirubin Urine: NEGATIVE
Glucose, UA: NEGATIVE mg/dL
Ketones, ur: NEGATIVE mg/dL
Nitrite: NEGATIVE
Protein, ur: 30 mg/dL — AB
RBC / HPF: >50 RBC/hpf — ABNORMAL HIGH (ref 0–5)
Specific Gravity, Urine: 1.006 (ref 1.005–1.030)
WBC, UA: >50 WBC/hpf — ABNORMAL HIGH (ref 0–5)
pH: 6 (ref 5.0–8.0)

## 2020-05-29 MED ORDER — NITROFURANTOIN MONOHYD MACRO 100 MG PO CAPS: 100.0000 mg | ORAL_CAPSULE | Freq: Two times a day (BID) | ORAL | 0 refills | Status: DC

## 2020-05-29 MED ORDER — PHENAZOPYRIDINE HCL 200 MG PO TABS: 200.0000 mg | ORAL_TABLET | Freq: Three times a day (TID) | ORAL | 0 refills | Status: DC

## 2020-05-29 NOTE — Discharge Instructions (Signed)
Please read and follow all provided instructions.  Your diagnoses today include:  1. Acute cystitis with hematuria     Tests performed today include:  Urine test - suggests that you have an infection in your bladder  Urine culture - pending  Vital signs. See below for your results today.   Medications prescribed:   Macrobid - antibiotic for urinary tract infection  You have been prescribed an antibiotic medicine: take the entire course of medicine even if you are feeling better. Stopping early can cause the antibiotic not to work.   Pyridium - medication for urinary tract infection symptoms.   This medication will turn your urine orange. This is normal.   Home care instructions:  Follow any educational materials contained in this packet.  Follow-up instructions: Please follow-up with your primary care provider in 3 days if symptoms are not resolved for further evaluation of your symptoms.  Return instructions:   Please return to the Emergency Department if you experience worsening symptoms.   Return with fever, worsening pain, persistent vomiting, worsening pain in your back.   Please return if you have any other emergent concerns.  Additional Information:  Your vital signs today were: BP (!) 171/91 (BP Location: Left Arm)    Pulse 97    Temp 98.5 F (36.9 C) (Oral)    Resp 18    Ht 5\' 5"  (1.651 m)    Wt 91.6 kg    LMP 05/15/2020 (Approximate)    SpO2 99%    BMI 33.61 kg/m  If your blood pressure (BP) was elevated above 135/85 this visit, please have this repeated by your doctor within one month. --------------

## 2020-05-29 NOTE — ED Triage Notes (Signed)
Pt states that for about a week she has had burning and pressure with urination. States she did telehealth visit and has taken two days of antibiotics for UTI but her symptoms have worsened. Alert and oriented.

## 2020-05-29 NOTE — ED Provider Notes (Signed)
[facility] DEPT Provider Note   CSN: [id number] Arrival date & time: 05/29/20  0920     History Chief Complaint  Patient presents with  . Urinary Tract Infection    Alexis Beck is a 44 y.o. female.  Patient presents to emergency department with 5-day history of suprapubic pain, increased frequency and urgency, dysuria, and mild hematuria.  Patient denies back pain, fevers, vomiting or chills.  Patient had a telehealth visit 3 days ago and was prescribed Bactrim.  She has been taking this however her symptoms seem to have worsened.  She did try Azo as well.  She denies vaginal bleeding or discharge and is not concerned about sexual transmitted infection.        Past Medical History:  Diagnosis Date  . Anxiety   . Asthma   . Bipolar disorder ([place])   . Depression     Patient Active Problem List   Diagnosis Date Noted  . Syncope and collapse 10/18/2017  . 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 Asthma 03/08/2013  . Depression 02/12/2012  . Anxiety 02/12/2012    Past Surgical History:  Procedure Laterality Date  . NECK SURGERY  10/2017  . TUBAL LIGATION  2003     OB History    Gravida  4   Para  3   Term  3   Preterm      AB  1   Living  3     SAB      TAB  1   Ectopic      Multiple      Live Births              Family History  Problem Relation Age of Onset  . Drug abuse Mother   . Drug abuse Father   . Depression Brother   . Depression Maternal Grandmother   . Schizophrenia Cousin     Social History   Tobacco Use  . Smoking status: Never Smoker  . Smokeless tobacco: Never Used  Substance Use Topics  . Alcohol use: No  . Drug use: No    Home Medications Prior to Admission medications   Medication Sig Start Date End Date Taking? Authorizing Provider  albuterol (PROVENTIL HFA;VENTOLIN HFA) 108 (90 Base) MCG/ACT inhaler Inhale 1-2 puffs into the lungs every 6 (six) hours as needed for wheezing or shortness of  breath.    [provider]  ferrous sulfate 325 (65 FE) MG tablet Take 325 mg by mouth daily.    [provider]  Multiple Vitamin (MULTIVITAMIN ADULT PO) Take 1 tablet by mouth daily.    [provider]  nitrofurantoin, macrocrystal-monohydrate, (MACROBID) 100 MG capsule Take 1 capsule (100 mg total) by mouth 2 (two) times daily. 05/29/20   Carlisle Cater, PA-C  ondansetron (ZOFRAN ODT) 4 MG disintegrating tablet Take 1 tablet (4 mg total) by mouth every 8 (eight) hours as needed for nausea or vomiting. 11/13/19   Argentina Donovan, PA-C  phenazopyridine (PYRIDIUM) 200 MG tablet Take 1 tablet (200 mg total) by mouth 3 (three) times daily. 05/29/20   Carlisle Cater, PA-C  cetirizine (ZYRTEC) 10 MG tablet Take 1 tablet (10 mg total) by mouth daily. Patient not taking: Reported on 11/02/2019 10/17/17 11/02/19  Maczis, Barth Kirks, PA-C  fluticasone [facility]) 50 MCG/ACT nasal spray Place 2 sprays into both nostrils daily. Patient not taking: Reported on 11/02/2019 10/17/17 11/02/19  Jillyn Ledger, PA-C    Allergies    Shellfish allergy  Review  of Systems   Review of Systems  Constitutional: Negative for fever.  HENT: Negative for rhinorrhea and sore throat.   Eyes: Negative for redness.  Respiratory: Negative for cough.   Cardiovascular: Negative for chest pain.  Gastrointestinal: Positive for abdominal pain. Negative for diarrhea, nausea and vomiting.  Genitourinary: Positive for dysuria, frequency, hematuria and urgency. Negative for vaginal bleeding and vaginal discharge.  Musculoskeletal: Negative for myalgias.  Skin: Negative for rash.  Neurological: Negative for headaches.    Physical Exam Updated Vital Signs BP (!) 171/91 (BP Location: Left Arm)   Pulse 97   Temp 98.5 F (36.9 C) (Oral)   Resp 18   Ht 5\' 5"  (1.651 m)   Wt 91.6 kg   LMP 05/15/2020 (Approximate)   SpO2 99%   BMI 33.61 kg/m   Physical Exam Vitals and nursing note reviewed.    Constitutional:      General: She is not in acute distress.    Appearance: She is well-developed.  HENT:     Head: Normocephalic and atraumatic.     Right Ear: External ear normal.     Left Ear: External ear normal.     Nose: Nose normal.  Eyes:     Conjunctiva/sclera: Conjunctivae normal.  Cardiovascular:     Rate and Rhythm: Normal rate and regular rhythm.     Heart sounds: No murmur heard.   Pulmonary:     Effort: No respiratory distress.     Breath sounds: No wheezing, rhonchi or rales.  Abdominal:     Palpations: Abdomen is soft.     Tenderness: There is abdominal tenderness. There is no guarding or rebound.     Comments: Mild suprapubic tenderness  Musculoskeletal:     Cervical back: Normal range of motion and neck supple.     Right lower leg: No edema.     Left lower leg: No edema.  Skin:    General: Skin is warm and dry.     Findings: No rash.  Neurological:     General: No focal deficit present.     Mental Status: She is alert. Mental status is at baseline.     Motor: No weakness.  Psychiatric:        Mood and Affect: Mood normal.     ED Results / Procedures / Treatments   Labs (all labs ordered are listed, but only abnormal results are displayed) Labs Reviewed  URINALYSIS, ROUTINE W REFLEX MICROSCOPIC - Abnormal; Notable for the following components:      Result Value   APPearance CLOUDY (*)    Hgb urine dipstick LARGE (*)    Protein, ur 30 (*)    Leukocytes,Ua LARGE (*)    RBC / HPF >50 (*)    WBC, UA >50 (*)    Bacteria, UA RARE (*)    All other components within normal limits  URINE CULTURE  I-STAT BETA HCG BLOOD, ED ([facility], [facility], [facility] ONLY)    EKG None  Radiology No results found.  Procedures Procedures (including critical care time)  Medications Ordered in ED Medications - No data to display  ED Course  I have reviewed the triage vital signs and the nursing notes.  Pertinent labs & imaging results that were available during my care of  the patient were reviewed by me and considered in my medical decision making (see chart for details).  Patient seen and examined.  UA is suggestive of urinary tract infection with greater than 50 white blood cells and  white blood cell clumps.  There is blood in there as well however patient symptoms are not consistent with kidney stone and she is not menstruating.  Suspect this is related to infection as well.  Will send urine culture given clinical failure of Bactrim.  Will place patient on Macrobid and give course of Azo.  Culture pending.  Vital signs reviewed and are as follows: BP (!) 171/91 (BP Location: Left Arm)   Pulse 97   Temp 98.5 F (36.9 C) (Oral)   Resp 18   Ht 5\' 5"  (1.651 m)   Wt 91.6 kg   LMP 05/15/2020 (Approximate)   SpO2 99%   BMI 33.61 kg/m   Encouraged return to the emergency department with worsening symptoms including high fevers, vomiting, severe back pain, other concerns.    MDM Rules/Calculators/A&P                          Patient with clinical UTI, supported by UA.  Symptoms worsening despite 3 days of Bactrim.  Will change from Bactrim to Macrobid.  Culture sent.  Doubt vaginal infection.   Final Clinical Impression(s) / ED Diagnoses Final diagnoses:  Acute cystitis with hematuria    Rx / DC Orders ED Discharge Orders         Ordered    nitrofurantoin, macrocrystal-monohydrate, (MACROBID) 100 MG capsule  2 times daily        05/29/20 1141    phenazopyridine (PYRIDIUM) 200 MG tablet  3 times daily        05/29/20 1141           Carlisle Cater, PA-C 05/29/20 1145    Daleen Bo, MD 05/30/20 0745

## 2020-05-31 ENCOUNTER — Inpatient Hospital Stay (HOSPITAL_COMMUNITY)
Admission: EM | Admit: 2020-05-31 | Discharge: 2020-06-02 | DRG: 690 | Disposition: A | Discharge status: Home / Self Care | Payer: Self-pay | Attending: Internal Medicine | Admitting: Internal Medicine

## 2020-05-31 ENCOUNTER — Encounter (HOSPITAL_COMMUNITY): Payer: Self-pay

## 2020-05-31 ENCOUNTER — Emergency Department (HOSPITAL_COMMUNITY): Payer: Self-pay

## 2020-05-31 ENCOUNTER — Other Ambulatory Visit: Payer: Self-pay

## 2020-05-31 DIAGNOSIS — Z20822 Contact with and (suspected) exposure to covid-19: Secondary | ICD-10-CM | POA: Diagnosis present

## 2020-05-31 DIAGNOSIS — Z813 Family history of other psychoactive substance abuse and dependence: Secondary | ICD-10-CM

## 2020-05-31 DIAGNOSIS — N12 Tubulo-interstitial nephritis, not specified as acute or chronic: Secondary | ICD-10-CM

## 2020-05-31 DIAGNOSIS — Z91013 Allergy to seafood: Secondary | ICD-10-CM

## 2020-05-31 DIAGNOSIS — Z818 Family history of other mental and behavioral disorders: Secondary | ICD-10-CM

## 2020-05-31 DIAGNOSIS — Z6833 Body mass index (BMI) 33.0-33.9, adult: Secondary | ICD-10-CM

## 2020-05-31 DIAGNOSIS — D509 Iron deficiency anemia, unspecified: Secondary | ICD-10-CM | POA: Diagnosis present

## 2020-05-31 DIAGNOSIS — N920 Excessive and frequent menstruation with regular cycle: Secondary | ICD-10-CM | POA: Diagnosis present

## 2020-05-31 DIAGNOSIS — N179 Acute kidney failure, unspecified: Secondary | ICD-10-CM | POA: Diagnosis present

## 2020-05-31 DIAGNOSIS — R300 Dysuria: Secondary | ICD-10-CM

## 2020-05-31 DIAGNOSIS — F319 Bipolar disorder, unspecified: Secondary | ICD-10-CM | POA: Diagnosis present

## 2020-05-31 DIAGNOSIS — N390 Urinary tract infection, site not specified: Principal | ICD-10-CM | POA: Diagnosis present

## 2020-05-31 DIAGNOSIS — J45909 Unspecified asthma, uncomplicated: Secondary | ICD-10-CM | POA: Diagnosis present

## 2020-05-31 DIAGNOSIS — E669 Obesity, unspecified: Secondary | ICD-10-CM | POA: Diagnosis present

## 2020-05-31 LAB — CBC WITH DIFFERENTIAL/PLATELET
Abs Immature Granulocytes: 0.01 10*3/uL (ref 0.00–0.07)
Basophils Absolute: 0 10*3/uL (ref 0.0–0.1)
Basophils Relative: 1 %
Eosinophils Absolute: 0.3 10*3/uL (ref 0.0–0.5)
Eosinophils Relative: 6 %
HCT: 36.7 % (ref 36.0–46.0)
Hemoglobin: 10.5 g/dL — ABNORMAL LOW (ref 12.0–15.0)
Immature Granulocytes: 0 %
Lymphocytes Relative: 21 %
Lymphs Abs: 0.9 10*3/uL (ref 0.7–4.0)
MCH: 21.9 pg — ABNORMAL LOW (ref 26.0–34.0)
MCHC: 28.6 g/dL — ABNORMAL LOW (ref 30.0–36.0)
MCV: 76.6 fL — ABNORMAL LOW (ref 80.0–100.0)
Monocytes Absolute: 0.4 10*3/uL (ref 0.1–1.0)
Monocytes Relative: 8 %
Neutro Abs: 2.8 10*3/uL (ref 1.7–7.7)
Neutrophils Relative %: 64 %
Platelets: 278 10*3/uL (ref 150–400)
RBC: 4.79 MIL/uL (ref 3.87–5.11)
RDW: 19.5 % — ABNORMAL HIGH (ref 11.5–15.5)
WBC: 4.3 10*3/uL (ref 4.0–10.5)
nRBC: 0 % (ref 0.0–0.2)

## 2020-05-31 LAB — COMPREHENSIVE METABOLIC PANEL
ALT: 17 U/L (ref 0–44)
AST: 21 U/L (ref 15–41)
Albumin: 3.9 g/dL (ref 3.5–5.0)
Alkaline Phosphatase: 43 U/L (ref 38–126)
Anion gap: 10 (ref 5–15)
BUN: 14 mg/dL (ref 6–20)
CO2: 24 mmol/L (ref 22–32)
Calcium: 9 mg/dL (ref 8.9–10.3)
Chloride: 104 mmol/L (ref 98–111)
Creatinine, Ser: 1.11 mg/dL — ABNORMAL HIGH (ref 0.44–1.00)
GFR, Estimated: >60 mL/min (ref >60)
Glucose, Bld: 107 mg/dL — ABNORMAL HIGH (ref 70–99)
Potassium: 4.3 mmol/L (ref 3.5–5.1)
Sodium: 138 mmol/L (ref 135–145)
Total Bilirubin: 0.8 mg/dL (ref 0.3–1.2)
Total Protein: 6.8 g/dL (ref 6.5–8.1)

## 2020-05-31 LAB — URINALYSIS, ROUTINE W REFLEX MICROSCOPIC
Bilirubin Urine: NEGATIVE
Glucose, UA: NEGATIVE mg/dL
Ketones, ur: NEGATIVE mg/dL
Leukocytes,Ua: NEGATIVE
Nitrite: POSITIVE — AB
Protein, ur: >=300 mg/dL — AB
RBC / HPF: >50 RBC/hpf — ABNORMAL HIGH (ref 0–5)
Specific Gravity, Urine: 1.019 (ref 1.005–1.030)
WBC, UA: >50 WBC/hpf — ABNORMAL HIGH (ref 0–5)
pH: 5 (ref 5.0–8.0)

## 2020-05-31 LAB — RESPIRATORY PANEL BY RT PCR (FLU A&B, COVID)
Influenza A by PCR: NEGATIVE
Influenza B by PCR: NEGATIVE
SARS Coronavirus 2 by RT PCR: NEGATIVE

## 2020-05-31 LAB — I-STAT BETA HCG BLOOD, ED (MC, WL, AP ONLY): I-stat hCG, quantitative: <5 m[IU]/mL (ref <5)

## 2020-05-31 LAB — IRON AND TIBC
Iron: 37 ug/dL (ref 28–170)
Saturation Ratios: 8 % — ABNORMAL LOW (ref 10.4–31.8)
TIBC: 469 ug/dL — ABNORMAL HIGH (ref 250–450)
UIBC: 432 ug/dL

## 2020-05-31 LAB — LIPASE, BLOOD: Lipase: 27 U/L (ref 11–51)

## 2020-05-31 LAB — FERRITIN: Ferritin: 5 ng/mL — ABNORMAL LOW (ref 11–307)

## 2020-05-31 MED ORDER — SODIUM CHLORIDE 0.9 % IV SOLN
2.0000 g | Freq: Once | INTRAVENOUS | Status: AC
  Administered 2020-05-31: 2 g via INTRAVENOUS
  Filled 2020-05-31: qty 20

## 2020-05-31 MED ORDER — FERROUS SULFATE 325 (65 FE) MG PO TABS
325.0000 mg | ORAL_TABLET | Freq: Every day | ORAL | Status: DC
  Administered 2020-06-01 – 2020-06-02 (×2): 325 mg via ORAL
  Filled 2020-05-31 (×2): qty 1

## 2020-05-31 MED ORDER — SODIUM CHLORIDE 0.9 % IV SOLN
1.0000 g | INTRAVENOUS | Status: DC
  Administered 2020-06-01: 1 g via INTRAVENOUS
  Filled 2020-05-31 (×2): qty 10

## 2020-05-31 MED ORDER — INFLUENZA VAC SPLIT QUAD 0.5 ML IM SUSY: 0.5000 mL | PREFILLED_SYRINGE | INTRAMUSCULAR | Status: DC | PRN

## 2020-05-31 MED ORDER — SODIUM CHLORIDE 0.9 % IV SOLN: INTRAVENOUS | Status: DC

## 2020-05-31 MED ORDER — PHENAZOPYRIDINE HCL 200 MG PO TABS
200.0000 mg | ORAL_TABLET | Freq: Three times a day (TID) | ORAL | Status: DC
  Administered 2020-05-31 – 2020-06-01 (×3): 200 mg via ORAL
  Filled 2020-05-31 (×3): qty 1

## 2020-05-31 MED ORDER — KETOROLAC TROMETHAMINE 15 MG/ML IJ SOLN
15.0000 mg | Freq: Once | INTRAMUSCULAR | Status: AC
  Administered 2020-05-31: 15 mg via INTRAVENOUS
  Filled 2020-05-31: qty 1

## 2020-05-31 MED ORDER — ENOXAPARIN SODIUM 40 MG/0.4ML ~~LOC~~ SOLN
40.0000 mg | SUBCUTANEOUS | Status: DC
  Administered 2020-05-31 – 2020-06-01 (×2): 40 mg via SUBCUTANEOUS
  Filled 2020-05-31 (×2): qty 0.4

## 2020-05-31 MED ORDER — LACTATED RINGERS IV BOLUS
1000.0000 mL | Freq: Once | INTRAVENOUS | Status: AC
  Administered 2020-05-31: 1000 mL via INTRAVENOUS

## 2020-05-31 MED ORDER — ONDANSETRON HCL 4 MG/2ML IJ SOLN
4.0000 mg | Freq: Once | INTRAMUSCULAR | Status: AC
  Administered 2020-05-31: 4 mg via INTRAVENOUS
  Filled 2020-05-31: qty 2

## 2020-05-31 NOTE — ED Provider Notes (Signed)
[facility] DEPT Provider Note   CSN: [id number] Arrival date & time: 05/31/20  1330     History Chief Complaint  Patient presents with  . Urinary Frequency  . Dysuria  . Back Pain  . Hematuria    Alexis Beck is a 44 y.o. female.  HPI      Alexis Beck is a 43 y.o. female, with a history of anxiety, asthma, bipolar, presenting to the ED with concern for persistent and worsening UTI. Patient began having symptoms of urinary urgency, frequency, and pain after urination beginning last week. She was prescribed Bactrim on October 20.  Symptoms continued and she was seen in the ED October 23, antibiotic changed to [facility].  She presents to the ED today due to persistent symptoms with additional symptoms of right mid and lower back pain, hematuria, subjective fever, and nausea.  She also complains of suprapubic pressure. She is sexually active in a monogamous relationship without concern for STDs.   Denies vomiting, diarrhea, chest pain, shortness of breath, abnormal vaginal discharge/bleeding, or any other complaints.   Past Medical History:  Diagnosis Date  . Anxiety   . Asthma   . Bipolar disorder ([place])   . Depression     Patient Active Problem List   Diagnosis Date Noted  . Acute lower UTI 05/31/2020  . UTI (urinary tract infection) 05/31/2020  . Syncope and collapse 10/18/2017  . 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 Asthma 03/08/2013  . Depression 02/12/2012  . Anxiety 02/12/2012    Past Surgical History:  Procedure Laterality Date  . NECK SURGERY  10/2017  . TUBAL LIGATION  2003     OB History    Gravida  4   Para  3   Term  3   Preterm      AB  1   Living  3     SAB      TAB  1   Ectopic      Multiple      Live Births              Family History  Problem Relation Age of Onset  . Drug abuse Mother   . Drug abuse Father   . Depression Brother   . Depression Maternal Grandmother   . Schizophrenia Cousin      Social History   Tobacco Use  . Smoking status: Never Smoker  . Smokeless tobacco: Never Used  Vaping Use  . Vaping Use: Never used  Substance Use Topics  . Alcohol use: No  . Drug use: No    Home Medications Prior to Admission medications   Medication Sig Start Date End Date Taking? Authorizing Provider  albuterol (PROVENTIL HFA;VENTOLIN HFA) 108 (90 Base) MCG/ACT inhaler Inhale 1-2 puffs into the lungs every 6 (six) hours as needed for wheezing or shortness of breath.    [provider]  ferrous sulfate 325 (65 FE) MG tablet Take 325 mg by mouth daily.    [provider]  Multiple Vitamin (MULTIVITAMIN ADULT PO) Take 1 tablet by mouth daily.    [provider]  nitrofurantoin, macrocrystal-monohydrate, (MACROBID) 100 MG capsule Take 1 capsule (100 mg total) by mouth 2 (two) times daily. 05/29/20   Carlisle Cater, PA-C  ondansetron (ZOFRAN ODT) 4 MG disintegrating tablet Take 1 tablet (4 mg total) by mouth every 8 (eight) hours as needed for nausea or vomiting. 11/13/19   Argentina Donovan, PA-C  phenazopyridine (PYRIDIUM) 200 MG tablet Take 1 tablet (  200 mg total) by mouth 3 (three) times daily. 05/29/20   Carlisle Cater, PA-C  cetirizine (ZYRTEC) 10 MG tablet Take 1 tablet (10 mg total) by mouth daily. Patient not taking: Reported on 11/02/2019 10/17/17 11/02/19  Maczis, Barth Kirks, PA-C  fluticasone [facility]) 50 MCG/ACT nasal spray Place 2 sprays into both nostrils daily. Patient not taking: Reported on 11/02/2019 10/17/17 11/02/19  Jillyn Ledger, PA-C    Allergies    Shellfish allergy  Review of Systems   Review of Systems  Constitutional: Positive for fever.  Respiratory: Negative for cough and shortness of breath.   Cardiovascular: Negative for chest pain.  Gastrointestinal: Positive for abdominal pain and nausea. Negative for blood in stool, diarrhea and vomiting.  Genitourinary: Positive for dysuria, frequency, hematuria and urgency. Negative  for vaginal bleeding and vaginal discharge.  Musculoskeletal: Positive for back pain.  Neurological: Negative for syncope and weakness.  All other systems reviewed and are negative.   Physical Exam Updated Vital Signs BP 135/80 (BP Location: Left Arm)   Pulse 93   Temp 99.2 F (37.3 C) (Oral)   Resp 18   Ht 5\' 5"  (1.651 m)   Wt 91.6 kg   LMP 05/15/2020 (Approximate)   SpO2 98%   BMI 33.61 kg/m   Physical Exam Vitals and nursing note reviewed.  Constitutional:      General: She is not in acute distress.    Appearance: She is well-developed. She is not diaphoretic.  HENT:     Head: Normocephalic and atraumatic.     Mouth/Throat:     Mouth: Mucous membranes are moist.     Pharynx: Oropharynx is clear.  Eyes:     Conjunctiva/sclera: Conjunctivae normal.  Cardiovascular:     Rate and Rhythm: Normal rate and regular rhythm.     Pulses: Normal pulses.          Radial pulses are 2+ on the right side and 2+ on the left side.     Heart sounds: Normal heart sounds.     Comments: Tactile temperature in the extremities appropriate and equal bilaterally. Pulmonary:     Effort: Pulmonary effort is normal. No respiratory distress.     Breath sounds: Normal breath sounds.  Abdominal:     Palpations: Abdomen is soft.     Tenderness: There is abdominal tenderness. There is right CVA tenderness. There is no guarding.    Musculoskeletal:     Cervical back: Neck supple.  Lymphadenopathy:     Cervical: No cervical adenopathy.  Skin:    General: Skin is warm and dry.  Neurological:     Mental Status: She is alert.  Psychiatric:        Mood and Affect: Mood and affect normal.        Speech: Speech normal.        Behavior: Behavior normal.     ED Results / Procedures / Treatments   Labs (all labs ordered are listed, but only abnormal results are displayed) Labs Reviewed  URINALYSIS, ROUTINE W REFLEX MICROSCOPIC - Abnormal; Notable for the following components:      Result  Value   Color, Urine RED (*)    APPearance CLOUDY (*)    Hgb urine dipstick LARGE (*)    Protein, ur >=300 (*)    Nitrite POSITIVE (*)    RBC / HPF >50 (*)    WBC, UA >50 (*)    Bacteria, UA FEW (*)    All other components within normal  limits  COMPREHENSIVE METABOLIC PANEL - Abnormal; Notable for the following components:   Glucose, Bld 107 (*)    Creatinine, Ser 1.11 (*)    All other components within normal limits  CBC WITH DIFFERENTIAL/PLATELET - Abnormal; Notable for the following components:   Hemoglobin 10.5 (*)    MCV 76.6 (*)    MCH 21.9 (*)    MCHC 28.6 (*)    RDW 19.5 (*)    All other components within normal limits  URINE CULTURE  RESPIRATORY PANEL BY RT PCR (FLU A&B, COVID)  CULTURE, BLOOD (ROUTINE X 2)  CULTURE, BLOOD (ROUTINE X 2)  LIPASE, BLOOD  HIV ANTIBODY (ROUTINE TESTING W REFLEX)  BASIC METABOLIC PANEL  CBC  IRON AND TIBC  FERRITIN  I-STAT BETA HCG BLOOD, ED ([facility], [facility], [facility] ONLY)    EKG None  Radiology CT Renal Stone Study  Result Date: 05/31/2020 CLINICAL DATA:  Flank pain, hematuria EXAM: CT ABDOMEN AND PELVIS WITHOUT CONTRAST TECHNIQUE: Multidetector CT imaging of the abdomen and pelvis was performed following the standard protocol without IV contrast. COMPARISON:  11/02/2019 FINDINGS: Lower chest: No acute abnormality. Hepatobiliary: No focal liver abnormality is seen. No gallstones, gallbladder wall thickening, or biliary dilatation. Pancreas: Unremarkable. Spleen: Unremarkable. Adrenals/Urinary Tract: Adrenals are unremarkable. There are no renal calculi. No hydronephrosis. No ureteral calculi. Bladder is poorly evaluated due to limited distension. Stomach/Bowel: Stomach is within normal limits. Bowel is normal in caliber. Distal colonic diverticulosis. Normal appendix. Vascular/Lymphatic: Minor aortic atherosclerosis. There are no enlarged lymph nodes identified. Reproductive: Enlarged, likely fibroid uterus.  No adnexal mass. Other: No ascites.   Abdominal wall is unremarkable. Musculoskeletal: Lower lumbar spine degenerative changes. IMPRESSION: No acute abnormality or findings to account for reported symptoms. Electronically Signed   By: Macy Mis M.D.   On: 05/31/2020 16:36    Procedures Procedures (including critical care time)  Medications Ordered in ED Medications  influenza vac split quadrivalent PF (FLUARIX) injection 0.5 mL (has no administration in time range)  phenazopyridine (PYRIDIUM) tablet 200 mg (has no administration in time range)  ferrous sulfate tablet 325 mg (has no administration in time range)  enoxaparin (LOVENOX) injection 40 mg (has no administration in time range)  cefTRIAXone (ROCEPHIN) 1 g in sodium chloride 0.9 % 100 mL IVPB (has no administration in time range)  0.9 %  sodium chloride infusion (has no administration in time range)  lactated ringers bolus 1,000 mL (1,000 mLs Intravenous New Bag/Given 05/31/20 1524)  ondansetron (ZOFRAN) injection 4 mg (4 mg Intravenous Given 05/31/20 1520)  ketorolac (TORADOL) 15 MG/ML injection 15 mg (15 mg Intravenous Given 05/31/20 1518)  cefTRIAXone (ROCEPHIN) 2 g in sodium chloride 0.9 % 100 mL IVPB (0 g Intravenous Stopped 05/31/20 1713)    ED Course  I have reviewed the triage vital signs and the nursing notes.  Pertinent labs & imaging results that were available during my care of the patient were reviewed by me and considered in my medical decision making (see chart for details).  Clinical Course as of May 31 1810  Mon May 31, 2020  1723 Spoke with Dr. Tawanna Solo, hospitalist.  Agrees to admit the patient.   [SJ]    Clinical Course User Index [SJ] Lenon Kuennen C, PA-C   MDM Rules/Calculators/A&P                          Patient presents with worsening symptoms of UTI, now with back pain and subjective fever.  Patient has been on 2 different antibiotics. I have concern for progression to pyelonephritis despite antibiotic therapy.  Urine culture  from October 23 is still pending. Evidence of infection on UA today. Progressive symptoms despite ABX. Concern for pyelonephritis.  Patient could benefit from at least observation admission to assure improvement prior to her next discharge.  Findings and plan of care discussed with Deno Etienne, DO. Dr. Tyrone Nine personally evaluated and examined this patient.  Vitals:   05/31/20 1347 05/31/20 1350 05/31/20 1600 05/31/20 1715  BP: 135/80  122/76 131/80  Pulse: 93  73 68  Resp: 18  16 16   Temp: 99.2 F (37.3 C)     TempSrc: Oral     SpO2: 98%  100% 100%  Weight:  91.6 kg    Height:  5\' 5"  (1.651 m)       Final Clinical Impression(s) / ED Diagnoses Final diagnoses:  Pyelonephritis    Rx / DC Orders ED Discharge Orders    None       Lorayne Bender, PA-C 05/31/20 [place], [place], DO 06/01/20 [phone]

## 2020-05-31 NOTE — ED Triage Notes (Signed)
Patient states she was 2 days ago in this ED for UTI and was prescribed Macrobid. Patient states symptoms of urinary frequency, hematuria and bilateral low back pain are worse.

## 2020-05-31 NOTE — H&P (Signed)
History and Physical    Alexis Beck QQV:[id number] DOB: 14-Jul-1976 DOA: 05/31/2020  PCP: Patient, No Pcp Per   Patient coming from: Home    Chief Complaint: Dysuria, lower abdominal pain  HPI: Alexis Beck is a 44 y.o. female with medical history significant of recurrent UTIs who presented to the emergency department for the evaluation of persistent suprapubic pain , increased frequency of urination, urgency, dysuria and also reported red urine.  Patient developed  urinary tract symptoms and she was prescribed Bactrim recently via telemedicine which she was taking but it did not help.She does not have a PCP.  She reported at least one episode of UTI in the past.  Patient was having persistent above symptoms so she presented to the emergency department today.  She denied any fever, chills, vomiting, nausea, diarrhea, chest pain or shortness of breath on presentation.  She also reported decreased appetite and was not eating/drinking enough at home because of her lower urinary symptoms. Patient is sexually active with her partner.  She denies any vaginal discharge or genital ulcers.  She has history of menorrhagia but is not currently following with GYN.  Takes iron pill. Patient seen and examined at the bedside in the emergency department.  During my evaluation, she was hemodynamically stable.  She states she is feeling more comfortable.  CT renal study did not show any significant findings.  Patient was admitted for further management of persistent UTI.  ED Course: Hemodynamically stable.  Blood work did not show any leukocytosis.  CT did not show any significant findings but pyelonephritis still suspected.  Since patient's symptoms did not improve with oral antibiotics, she was admitted for IV antibiotics.  Urine culture, blood culture sent.  Review of Systems: As per HPI otherwise 10 point review of systems negative.    Past Medical History:  Diagnosis Date  . Anxiety   . Asthma   .  Bipolar disorder ([place])   . Depression     Past Surgical History:  Procedure Laterality Date  . NECK SURGERY  10/2017  . TUBAL LIGATION  2003     reports that she has never smoked. She has never used smokeless tobacco. She reports that she does not drink alcohol and does not use drugs.  Allergies  Allergen Reactions  . Shellfish Allergy Anaphylaxis and Hives    Family History  Problem Relation Age of Onset  . Drug abuse Mother   . Drug abuse Father   . Depression Brother   . Depression Maternal Grandmother   . Schizophrenia Cousin      Prior to Admission medications   Medication Sig Start Date End Date Taking? Authorizing Provider  albuterol (PROVENTIL HFA;VENTOLIN HFA) 108 (90 Base) MCG/ACT inhaler Inhale 1-2 puffs into the lungs every 6 (six) hours as needed for wheezing or shortness of breath.    [provider]  ferrous sulfate 325 (65 FE) MG tablet Take 325 mg by mouth daily.    [provider]  Multiple Vitamin (MULTIVITAMIN ADULT PO) Take 1 tablet by mouth daily.    [provider]  nitrofurantoin, macrocrystal-monohydrate, (MACROBID) 100 MG capsule Take 1 capsule (100 mg total) by mouth 2 (two) times daily. 05/29/20   Carlisle Cater, PA-C  ondansetron (ZOFRAN ODT) 4 MG disintegrating tablet Take 1 tablet (4 mg total) by mouth every 8 (eight) hours as needed for nausea or vomiting. 11/13/19   Argentina Donovan, PA-C  phenazopyridine (PYRIDIUM) 200 MG tablet Take 1 tablet (200 mg total) by  mouth 3 (three) times daily. 05/29/20   Carlisle Cater, PA-C  cetirizine (ZYRTEC) 10 MG tablet Take 1 tablet (10 mg total) by mouth daily. Patient not taking: Reported on 11/02/2019 10/17/17 11/02/19  Maczis, Barth Kirks, PA-C  fluticasone [facility]) 50 MCG/ACT nasal spray Place 2 sprays into both nostrils daily. Patient not taking: Reported on 11/02/2019 10/17/17 11/02/19  Jillyn Ledger, PA-C    Physical Exam: Vitals:   05/31/20 1347 05/31/20 1350 05/31/20 1600  05/31/20 1715  BP: 135/80  122/76 131/80  Pulse: 93  73 68  Resp: 18  16 16   Temp: 99.2 F (37.3 C)     TempSrc: Oral     SpO2: 98%  100% 100%  Weight:  91.6 kg    Height:  5\' 5"  (1.651 m)      Constitutional: Very pleasant female, overall comfortable Vitals:   05/31/20 1347 05/31/20 1350 05/31/20 1600 05/31/20 1715  BP: 135/80  122/76 131/80  Pulse: 93  73 68  Resp: 18  16 16   Temp: 99.2 F (37.3 C)     TempSrc: Oral     SpO2: 98%  100% 100%  Weight:  91.6 kg    Height:  5\' 5"  (1.651 m)     Eyes: PERRL, lids and conjunctivae normal ENMT: Mucous membranes are moist.  Neck: normal, supple, no masses, no thyromegaly Respiratory: clear to auscultation bilaterally, no wheezing, no crackles. Normal respiratory effort. No accessory muscle use.  Cardiovascular: Regular rate and rhythm, no murmurs / rubs / gallops. No extremity edema.  Abdomen: Suprapubic tenderness, no masses palpated. No hepatosplenomegaly. Bowel sounds positive.  Negative costovertebral angle tenderness Musculoskeletal: no clubbing / cyanosis. No joint deformity upper and lower extremities.  Skin: no rashes, lesions, ulcers. No induration Neurologic: CN 2-12 grossly intact.  Strength 5/5 in all 4.  Psychiatric: Normal judgment and insight. Alert and oriented x 3. Normal mood.   Foley Catheter:None  Labs on Admission: I have personally reviewed following labs and imaging studies  CBC: Recent Labs  Lab 05/31/20 1509  WBC 4.3  NEUTROABS 2.8  HGB 10.5*  HCT 36.7  MCV 76.6*  PLT 280   Basic Metabolic Panel: Recent Labs  Lab 05/31/20 1509  NA 138  K 4.3  CL 104  CO2 24  GLUCOSE 107*  BUN 14  CREATININE 1.11*  CALCIUM 9.0   GFR: Estimated Creatinine Clearance: 71.5 mL/min (A) (by C-G formula based on SCr of 1.11 mg/dL (H)). Liver Function Tests: Recent Labs  Lab 05/31/20 1509  AST 21  ALT 17  ALKPHOS 43  BILITOT 0.8  PROT 6.8  ALBUMIN 3.9   Recent Labs  Lab 05/31/20 1509  LIPASE 27     No results for input(s): AMMONIA in the last 168 hours. Coagulation Profile: No results for input(s): INR, PROTIME in the last 168 hours. Cardiac Enzymes: No results for input(s): CKTOTAL, CKMB, CKMBINDEX, TROPONINI in the last 168 hours. BNP (last 3 results) No results for input(s): PROBNP in the last 8760 hours. HbA1C: No results for input(s): HGBA1C in the last 72 hours. CBG: No results for input(s): GLUCAP in the last 168 hours. Lipid Profile: No results for input(s): CHOL, HDL, LDLCALC, TRIG, CHOLHDL, LDLDIRECT in the last 72 hours. Thyroid Function Tests: No results for input(s): TSH, T4TOTAL, FREET4, T3FREE, THYROIDAB in the last 72 hours. Anemia Panel: No results for input(s): VITAMINB12, FOLATE, FERRITIN, TIBC, IRON, RETICCTPCT in the last 72 hours. Urine analysis:    Component Value Date/Time  COLORURINE RED (A) 05/31/2020 1427   APPEARANCEUR CLOUDY (A) 05/31/2020 1427   LABSPEC 1.019 05/31/2020 1427   PHURINE 5.0 05/31/2020 1427   GLUCOSEU NEGATIVE 05/31/2020 1427   HGBUR LARGE (A) 05/31/2020 1427   BILIRUBINUR NEGATIVE 05/31/2020 1427   KETONESUR NEGATIVE 05/31/2020 1427   PROTEINUR >=300 (A) 05/31/2020 1427   UROBILINOGEN 0.2 02/18/2019 1438   NITRITE POSITIVE (A) 05/31/2020 1427   LEUKOCYTESUR NEGATIVE 05/31/2020 1427    Radiological Exams on Admission: CT Renal Stone Study  Result Date: 05/31/2020 CLINICAL DATA:  Flank pain, hematuria EXAM: CT ABDOMEN AND PELVIS WITHOUT CONTRAST TECHNIQUE: Multidetector CT imaging of the abdomen and pelvis was performed following the standard protocol without IV contrast. COMPARISON:  11/02/2019 FINDINGS: Lower chest: No acute abnormality. Hepatobiliary: No focal liver abnormality is seen. No gallstones, gallbladder wall thickening, or biliary dilatation. Pancreas: Unremarkable. Spleen: Unremarkable. Adrenals/Urinary Tract: Adrenals are unremarkable. There are no renal calculi. No hydronephrosis. No ureteral calculi. Bladder  is poorly evaluated due to limited distension. Stomach/Bowel: Stomach is within normal limits. Bowel is normal in caliber. Distal colonic diverticulosis. Normal appendix. Vascular/Lymphatic: Minor aortic atherosclerosis. There are no enlarged lymph nodes identified. Reproductive: Enlarged, likely fibroid uterus.  No adnexal mass. Other: No ascites.  Abdominal wall is unremarkable. Musculoskeletal: Lower lumbar spine degenerative changes. IMPRESSION: No acute abnormality or findings to account for reported symptoms. Electronically Signed   By: Macy Mis M.D.   On: 05/31/2020 16:36     Assessment/Plan Principal Problem:   Acute lower UTI Active Problems:   UTI (urinary tract infection)   UTI not responding to outpatient antibiotic/ possible pyelonephritis: Presented with severe lower abdominal pain, increased frequency, urgency, dysuria.  Also reported red urine.  No report of fever or chills.  She was prescribed Bactrim by televisit which did not help.  Patient was admitted for the management of persistent UTI not responding to outpatient antibiotic.  She was started on ceftriaxone. Will check blood culture, urine culture.  She does not have leukocytosis.  UA suggestive of UTI. CT renal stone study did not show significant findings.  Suprapubic tenderness.  Microcytic anemia: Has history of menorrhagia.  Takes iron supplements.  Will check iron studies.  I will recommend her to follow-up with GYN as an outpatient  Mild AKI: Poor oral intake at home.  Creatinine in the range of 1.1.  We will continue gentle IV fluids.  Check BMP tomorrow  Obesity: BMI of 33.6   Severity of Illness: The appropriate patient status for this patient is OBSERVATION.    DVT prophylaxis: Lovenox Code Status: Full Family Communication: None Consults called: None     Shelly Coss MD [facility]  05/31/2020, 5:44 PM

## 2020-05-31 NOTE — Plan of Care (Signed)
Received patient from ED per wheelchair, complaining of severe lower abdominal/bladder pain. MD notified. One time dose of Ketorolac IV given. IV fluids started. Call bell within reach.  Problem: Urinary Elimination: Goal: Signs and symptoms of infection will decrease 05/31/2020 2328 by Normand Sloop, RN Outcome: Progressing 05/31/2020 2325 by Normand Sloop, RN Outcome: Progressing   Problem: Education: Goal: Knowledge of General Education information will improve Description: Including pain rating scale, medication(s)/side effects and non-pharmacologic comfort measures Outcome: Progressing   Problem: Health Behavior/Discharge Planning: Goal: Ability to manage health-related needs will improve Outcome: Progressing   Problem: Clinical Measurements: Goal: Ability to maintain clinical measurements within normal limits will improve Outcome: Progressing Goal: Will remain free from infection Outcome: Progressing Goal: Diagnostic test results will improve Outcome: Progressing Goal: Respiratory complications will improve Outcome: Progressing Goal: Cardiovascular complication will be avoided Outcome: Progressing   Problem: Activity: Goal: Risk for activity intolerance will decrease Outcome: Progressing   Problem: Nutrition: Goal: Adequate nutrition will be maintained Outcome: Progressing   Problem: Coping: Goal: Level of anxiety will decrease Outcome: Progressing   Problem: Elimination: Goal: Will not experience complications related to bowel motility Outcome: Progressing Goal: Will not experience complications related to urinary retention Outcome: Progressing   Problem: Pain Managment: Goal: General experience of comfort will improve Outcome: Progressing   Problem: Safety: Goal: Ability to remain free from injury will improve Outcome: Progressing   Problem: Skin Integrity: Goal: Risk for impaired skin integrity will decrease Outcome: Progressing

## 2020-05-31 NOTE — ED Notes (Signed)
ED TO INPATIENT HANDOFF REPORT  ED Nurse Name and Phone #: Fredonia Highland [phone]  S Name/Age/Gender Webb Laws 44 y.o. female Room/Bed: WA25/WA25  Code Status   Code Status: Full Code  Home/SNF/Other Home Patient oriented to: self, place, time and situation Is this baseline? Yes   Triage Complete: Triage complete  Chief Complaint UTI (urinary tract infection) [N39.0]  Triage Note Patient states she was 2 days ago in this ED for UTI and was prescribed Macrobid. Patient states symptoms of urinary frequency, hematuria and bilateral low back pain are worse.    Allergies Allergies  Allergen Reactions  . Shellfish Allergy Anaphylaxis and Hives    Level of Care/Admitting Diagnosis ED Disposition    ED Disposition Condition [facility] Area: [facility] [[id number]]  Level of Care: Med-Surg [16]  Covid Evaluation: Asymptomatic Screening Protocol (No Symptoms)  Diagnosis: UTI (urinary tract infection) [505397]  Admitting Physician: Shelly Coss [[id number]]  Attending Physician: Shelly Coss [[id number]]       B Medical/Surgery History Past Medical History:  Diagnosis Date  . Anxiety   . Asthma   . Bipolar disorder ([place])   . Depression    Past Surgical History:  Procedure Laterality Date  . NECK SURGERY  10/2017  . TUBAL LIGATION  2003     A IV Location/Drains/Wounds Patient Lines/Drains/Airways Status    Active Line/Drains/Airways    Name Placement date Placement time Site Days   Peripheral IV 05/31/20 Right;Lateral Antecubital 05/31/20  1518  Antecubital  less than 1   Wound / Incision (Open or Dehisced) 10/18/17 Laceration Other (Comment) Mid 10/18/17  0500  Other (Comment)  956          Intake/Output Last 24 hours No intake or output data in the 24 hours ending 05/31/20 2048  Labs/Imaging Results for orders placed or performed during the hospital encounter of 05/31/20 (from the past 48 hour(s))  Urinalysis,  Routine w reflex microscopic Urine, Clean Catch     Status: Abnormal   Collection Time: 05/31/20  2:27 PM  Result Value Ref Range   Color, Urine RED (A) YELLOW   APPearance CLOUDY (A) CLEAR   Specific Gravity, Urine 1.019 1.005 - 1.030   pH 5.0 5.0 - 8.0   Glucose, UA NEGATIVE NEGATIVE mg/dL   Hgb urine dipstick LARGE (A) NEGATIVE   Bilirubin Urine NEGATIVE NEGATIVE   Ketones, ur NEGATIVE NEGATIVE mg/dL   Protein, ur >=300 (A) NEGATIVE mg/dL   Nitrite POSITIVE (A) NEGATIVE   Leukocytes,Ua NEGATIVE NEGATIVE   RBC / HPF >50 (H) 0 - 5 RBC/hpf   WBC, UA >50 (H) 0 - 5 WBC/hpf   Bacteria, UA FEW (A) NONE SEEN   WBC Clumps PRESENT    Mucus PRESENT    Hyaline Casts, UA PRESENT     Comment: Performed at [facility], [place] [address], [place], [place] [address]  Comprehensive metabolic panel     Status: Abnormal   Collection Time: 05/31/20  3:09 PM  Result Value Ref Range   Sodium 138 135 - 145 mmol/L   Potassium 4.3 3.5 - 5.1 mmol/L   Chloride 104 98 - 111 mmol/L   CO2 24 22 - 32 mmol/L   Glucose, Bld 107 (H) 70 - 99 mg/dL    Comment: Glucose reference range applies only to samples taken after fasting for at least 8 hours.   BUN 14 6 - 20 mg/dL   Creatinine, Ser 1.11 (H) 0.44 -  1.00 mg/dL   Calcium 9.0 8.9 - 10.3 mg/dL   Total Protein 6.8 6.5 - 8.1 g/dL   Albumin 3.9 3.5 - 5.0 g/dL   AST 21 15 - 41 U/L   ALT 17 0 - 44 U/L   Alkaline Phosphatase 43 38 - 126 U/L   Total Bilirubin 0.8 0.3 - 1.2 mg/dL   GFR, Estimated >60 >60 mL/min    Comment: (NOTE) Calculated using the CKD-EPI Creatinine Equation (2021)    Anion gap 10 5 - 15    Comment: Performed at [facility], [place] [address], [place], [place] [address]  CBC with Differential     Status: Abnormal   Collection Time: 05/31/20  3:09 PM  Result Value Ref Range   WBC 4.3 4.0 - 10.5 K/uL   RBC 4.79 3.87 - 5.11 MIL/uL   Hemoglobin 10.5 (L) 12.0 - 15.0 g/dL   HCT 36.7 36 - 46 %   MCV 76.6 (L)  80.0 - 100.0 fL   MCH 21.9 (L) 26.0 - 34.0 pg   MCHC 28.6 (L) 30.0 - 36.0 g/dL   RDW 19.5 (H) 11.5 - 15.5 %   Platelets 278 150 - 400 K/uL   nRBC 0.0 0.0 - 0.2 %   Neutrophils Relative % 64 %   Neutro Abs 2.8 1.7 - 7.7 K/uL   Lymphocytes Relative 21 %   Lymphs Abs 0.9 0.7 - 4.0 K/uL   Monocytes Relative 8 %   Monocytes Absolute 0.4 0.1 - 1.0 K/uL   Eosinophils Relative 6 %   Eosinophils Absolute 0.3 0.0 - 0.5 K/uL   Basophils Relative 1 %   Basophils Absolute 0.0 0.0 - 0.1 K/uL   Immature Granulocytes 0 %   Abs Immature Granulocytes 0.01 0.00 - 0.07 K/uL    Comment: Performed at [facility], [place] [address], [place], [place] [address]  Lipase, blood     Status: None   Collection Time: 05/31/20  3:09 PM  Result Value Ref Range   Lipase 27 11 - 51 U/L    Comment: Performed at [facility], [place] [address], [place], [place] [address]  I-Stat beta hCG blood, ED     Status: None   Collection Time: 05/31/20  3:32 PM  Result Value Ref Range   I-stat hCG, quantitative <5.0 <5 mIU/mL   Comment 3            Comment:   GEST. AGE      CONC.  (mIU/mL)   <=1 WEEK        5 - 50     2 WEEKS       50 - 500     3 WEEKS       100 - 10,000     4 WEEKS     1,000 - 30,000        FEMALE AND NON-PREGNANT FEMALE:     LESS THAN 5 mIU/mL   Respiratory Panel by RT PCR (Flu A&B, Covid) - Nasopharyngeal Swab     Status: None   Collection Time: 05/31/20  5:18 PM   Specimen: Nasopharyngeal Swab  Result Value Ref Range   SARS Coronavirus 2 by RT PCR NEGATIVE NEGATIVE    Comment: (NOTE) SARS-CoV-2 target nucleic acids are NOT DETECTED.  The SARS-CoV-2 RNA is generally detectable in upper respiratoy specimens during the acute phase of infection. The lowest concentration of SARS-CoV-2 viral copies this assay can detect is 131 copies/mL. A negative  result does not preclude SARS-Cov-2 infection and should not be used as the sole basis for treatment or other patient  management decisions. A negative result may occur with  improper specimen collection/handling, submission of specimen other than nasopharyngeal swab, presence of viral mutation(s) within the areas targeted by this assay, and inadequate number of viral copies (<131 copies/mL). A negative result must be combined with clinical observations, patient history, and epidemiological information. The expected result is Negative.  Fact Sheet for Patients:  PinkCheek.be  Fact Sheet for Healthcare Providers:  GravelBags.it  This test is no t yet approved or cleared by the [place] FDA and  has been authorized for detection and/or diagnosis of SARS-CoV-2 by FDA under an Emergency Use Authorization (EUA). This EUA will remain  in effect (meaning this test can be used) for the duration of the COVID-19 declaration under Section 564(b)(1) of the Act, 21 U.S.C. section 360bbb-3(b)(1), unless the authorization is terminated or revoked sooner.     Influenza A by PCR NEGATIVE NEGATIVE   Influenza B by PCR NEGATIVE NEGATIVE    Comment: (NOTE) The Xpert Xpress SARS-CoV-2/FLU/RSV assay is intended as an aid in  the diagnosis of influenza from Nasopharyngeal swab specimens and  should not be used as a sole basis for treatment. Nasal washings and  aspirates are unacceptable for Xpert Xpress SARS-CoV-2/FLU/RSV  testing.  Fact Sheet for Patients: PinkCheek.be  Fact Sheet for Healthcare Providers: GravelBags.it  This test is not yet approved or cleared by the [place] FDA and  has been authorized for detection and/or diagnosis of SARS-CoV-2 by  FDA under an Emergency Use Authorization (EUA). This EUA will remain  in effect (meaning this test can be used) for the duration of the  Covid-19 declaration under Section 564(b)(1) of the Act, 21  U.S.C. section 360bbb-3(b)(1), unless the  authorization is  terminated or revoked. Performed at [facility], [place] [address], [place], [place] [address]   Iron and TIBC     Status: Abnormal   Collection Time: 05/31/20  6:42 PM  Result Value Ref Range   Iron 37 28 - 170 ug/dL   TIBC 469 (H) 250 - 450 ug/dL   Saturation Ratios 8 (L) 10.4 - 31.8 %   UIBC 432 ug/dL    Comment: Performed at [facility], [place] [address] [place], [place] [address]  Ferritin     Status: Abnormal   Collection Time: 05/31/20  6:42 PM  Result Value Ref Range   Ferritin 5 (L) 11 - 307 ng/mL    Comment: Performed at [facility], [place] [address] [place],  [address]   CT Renal Stone Study  Result Date: 05/31/2020 CLINICAL DATA:  Flank pain, hematuria EXAM: CT ABDOMEN AND PELVIS WITHOUT CONTRAST TECHNIQUE: Multidetector CT imaging of the abdomen and pelvis was performed following the standard protocol without IV contrast. COMPARISON:  11/02/2019 FINDINGS: Lower chest: No acute abnormality. Hepatobiliary: No focal liver abnormality is seen. No gallstones, gallbladder wall thickening, or biliary dilatation. Pancreas: Unremarkable. Spleen: Unremarkable. Adrenals/Urinary Tract: Adrenals are unremarkable. There are no renal calculi. No hydronephrosis. No ureteral calculi. Bladder is poorly evaluated due to limited distension. Stomach/Bowel: Stomach is within normal limits. Bowel is normal in caliber. Distal colonic diverticulosis. Normal appendix. Vascular/Lymphatic: Minor aortic atherosclerosis. There are no enlarged lymph nodes identified. Reproductive: Enlarged, likely fibroid uterus.  No adnexal mass. Other: No ascites.  Abdominal wall is unremarkable. Musculoskeletal: Lower lumbar spine degenerative changes. IMPRESSION: No acute abnormality  or findings to account for reported symptoms. Electronically Signed   By: Macy Mis M.D.   On: 05/31/2020 16:36    Pending Labs Unresulted Labs (From  admission, onward)          Start     Ordered   06/01/20 6244  Basic metabolic panel  Tomorrow morning,   R        05/31/20 1743   06/01/20 0500  CBC  Tomorrow morning,   R        05/31/20 1743   05/31/20 1742  HIV Antibody (routine testing w rflx)  (HIV Antibody (Routine testing w reflex) panel)  Once,   STAT        05/31/20 1743   05/31/20 1726  Culture, blood (routine x 2)  BLOOD CULTURE X 2,   STAT      05/31/20 1725   05/31/20 1443  Urine culture  ONCE - STAT,   STAT        05/31/20 1442          Vitals/Pain Today's Vitals   05/31/20 1349 05/31/20 1350 05/31/20 1600 05/31/20 1715  BP:   122/76 131/80  Pulse:   73 68  Resp:   16 16  Temp:      TempSrc:      SpO2:   100% 100%  Weight:  91.6 kg    Height:  5\' 5"  (1.651 m)    PainSc: 7        Isolation Precautions No active isolations  Medications Medications  influenza vac split quadrivalent PF (FLUARIX) injection 0.5 mL (has no administration in time range)  phenazopyridine (PYRIDIUM) tablet 200 mg (has no administration in time range)  ferrous sulfate tablet 325 mg (has no administration in time range)  enoxaparin (LOVENOX) injection 40 mg (has no administration in time range)  cefTRIAXone (ROCEPHIN) 1 g in sodium chloride 0.9 % 100 mL IVPB (has no administration in time range)  0.9 %  sodium chloride infusion (has no administration in time range)  lactated ringers bolus 1,000 mL (1,000 mLs Intravenous New Bag/Given 05/31/20 1524)  ondansetron (ZOFRAN) injection 4 mg (4 mg Intravenous Given 05/31/20 1520)  ketorolac (TORADOL) 15 MG/ML injection 15 mg (15 mg Intravenous Given 05/31/20 1518)  cefTRIAXone (ROCEPHIN) 2 g in sodium chloride 0.9 % 100 mL IVPB (0 g Intravenous Stopped 05/31/20 1713)    Mobility walks Low fall risk   Focused Assessments Renal Assessment Handoff:    R Recommendations: See Admitting Provider Note  Report given to:   Additional Notes:

## 2020-06-01 ENCOUNTER — Inpatient Hospital Stay (HOSPITAL_COMMUNITY): Payer: Self-pay

## 2020-06-01 LAB — BASIC METABOLIC PANEL
Anion gap: 9 (ref 5–15)
BUN: 16 mg/dL (ref 6–20)
CO2: 23 mmol/L (ref 22–32)
Calcium: 8.4 mg/dL — ABNORMAL LOW (ref 8.9–10.3)
Chloride: 107 mmol/L (ref 98–111)
Creatinine, Ser: 1.1 mg/dL — ABNORMAL HIGH (ref 0.44–1.00)
GFR, Estimated: >60 mL/min (ref >60)
Glucose, Bld: 101 mg/dL — ABNORMAL HIGH (ref 70–99)
Potassium: 3.9 mmol/L (ref 3.5–5.1)
Sodium: 139 mmol/L (ref 135–145)

## 2020-06-01 LAB — URINE CULTURE: Culture: <10000 — AB

## 2020-06-01 LAB — HIV ANTIBODY (ROUTINE TESTING W REFLEX): HIV Screen 4th Generation wRfx: NONREACTIVE

## 2020-06-01 LAB — CBC
HCT: 32.5 % — ABNORMAL LOW (ref 36.0–46.0)
Hemoglobin: 9.3 g/dL — ABNORMAL LOW (ref 12.0–15.0)
MCH: 21.9 pg — ABNORMAL LOW (ref 26.0–34.0)
MCHC: 28.6 g/dL — ABNORMAL LOW (ref 30.0–36.0)
MCV: 76.7 fL — ABNORMAL LOW (ref 80.0–100.0)
Platelets: 248 10*3/uL (ref 150–400)
RBC: 4.24 MIL/uL (ref 3.87–5.11)
RDW: 19.2 % — ABNORMAL HIGH (ref 11.5–15.5)
WBC: 3.7 10*3/uL — ABNORMAL LOW (ref 4.0–10.5)
nRBC: 0 % (ref 0.0–0.2)

## 2020-06-01 MED ORDER — OXYBUTYNIN CHLORIDE 5 MG PO TABS
5.0000 mg | ORAL_TABLET | Freq: Three times a day (TID) | ORAL | Status: DC | PRN
  Administered 2020-06-01: 5 mg via ORAL
  Filled 2020-06-01: qty 1

## 2020-06-01 MED ORDER — TRAMADOL HCL 50 MG PO TABS
50.0000 mg | ORAL_TABLET | Freq: Two times a day (BID) | ORAL | Status: AC | PRN
  Administered 2020-06-01: 50 mg via ORAL
  Filled 2020-06-01: qty 1

## 2020-06-01 MED ORDER — TRAMADOL HCL 50 MG PO TABS
50.0000 mg | ORAL_TABLET | Freq: Four times a day (QID) | ORAL | Status: DC | PRN
  Administered 2020-06-01: 50 mg via ORAL
  Filled 2020-06-01: qty 1

## 2020-06-01 MED ORDER — KETOROLAC TROMETHAMINE 15 MG/ML IJ SOLN
15.0000 mg | Freq: Four times a day (QID) | INTRAMUSCULAR | Status: DC | PRN
  Administered 2020-06-01: 15 mg via INTRAVENOUS
  Filled 2020-06-01: qty 1

## 2020-06-01 MED ORDER — TRAMADOL HCL 50 MG PO TABS: 50.0000 mg | ORAL_TABLET | Freq: Four times a day (QID) | ORAL | Status: DC | PRN

## 2020-06-01 NOTE — Progress Notes (Signed)
Patient in pain, c/o of 9/10 pain on lower abdomen / bladder. MD notified thru text page, awaiting for reply.

## 2020-06-01 NOTE — Progress Notes (Signed)
PROGRESS NOTE    Alexis Beck  RWE:[id number] DOB: 16-Nov-1975 DOA: 05/31/2020 PCP: Patient, No Pcp Per   Chief Complain:Dysuria  Brief Narrative:   Alexis Beck is a 44 y.o. female with medical history significant of recurrent UTIs who presented to the emergency department for the evaluation of persistent suprapubic pain , increased frequency of urination, urgency, dysuria and also reported red urine.  Patient developed  urinary tract symptoms and she was prescribed Bactrim recently via telemedicine which she was taking but it did not help.  On presentation, UA was suggestive of UTI.  She was started on IV antibiotics.  Urine culture pending.   Assessment & Plan:   Principal Problem:   Acute lower UTI Active Problems:   UTI (urinary tract infection)   UTI not responding to outpatient antibiotic/ possible pyelonephritis: Presented with severe lower abdominal pain, increased frequency, urgency, dysuria.  Also reported red urine.  No report of fever or chills.  She was prescribed Bactrim by televisit which did not help.  Patient was admitted for the management of persistent UTI not responding to outpatient antibiotic.  She was started on ceftriaxone. CT renal stone study did not show significant findings.    Today she does not have any suprapubic tenderness or back pain.  She feels great but is still has some contraction of the bladder.  Started on oxybutynin.  Microcytic anemia: Has history of menorrhagia.  Takes iron supplements. I will recommend her to follow-up with GYN as an outpatient  Mild AKI: Poor oral intake at home.  treated with IV fluids.   Obesity: BMI of 33.6         DVT prophylaxis:Lovenox Code Status: Full Family Communication: None at bedside Status is: Observation  The patient remains OBS appropriate and will d/c before 2 midnights.  Dispo: The patient is from: Home              Anticipated d/c is to: Home              Anticipated d/c date is: 1 day               Patient currently is not medically stable to d/c.     Consultants: None  Procedures:None  Antimicrobials:  Anti-infectives (From admission, onward)   Start     Dose/Rate Route Frequency Ordered Stop   06/01/20 1630  cefTRIAXone (ROCEPHIN) 1 g in sodium chloride 0.9 % 100 mL IVPB        1 g 200 mL/hr over 30 Minutes Intravenous Every 24 hours 05/31/20 1744     05/31/20 1545  cefTRIAXone (ROCEPHIN) 2 g in sodium chloride 0.9 % 100 mL IVPB        2 g 200 mL/hr over 30 Minutes Intravenous  Once 05/31/20 1544 05/31/20 1713      Subjective:  Patient seen and examined at the bedside this morning.  Hemodynamically stable.  She feels much better today.  Denies any abdominal pain but still having some lower abdominal discomfort from bladder spasms.  Looks much better.    Objective: Vitals:   05/31/20 2124 06/01/20 0117 06/01/20 0525 06/01/20 0922  BP: 115/78 (!) 112/56 100/78 107/86  Pulse: 80 75 84 75  Resp: 15 15 15 20   Temp: 98.6 F (37 C) 98.6 F (37 C) 98.8 F (37.1 C) 98.1 F (36.7 C)  TempSrc: Oral  Oral Oral  SpO2: 98% 100% 94% 97%  Weight:      Height:  Intake/Output Summary (Last 24 hours) at 06/01/2020 1119 Last data filed at 06/01/2020 0900 Gross per 24 hour  Intake 755.78 ml  Output --  Net 755.78 ml   Filed Weights   05/31/20 1350  Weight: 91.6 kg    Examination:  General exam: Appears calm and comfortable ,Not in distress,obese HEENT:PERRL,Oral mucosa moist, Ear/Nose normal on gross exam Respiratory system: Bilateral equal air entry, normal vesicular breath sounds, no wheezes or crackles  Cardiovascular system: S1 & S2 heard, RRR. No JVD, murmurs, rubs, gallops or clicks. No pedal edema. Gastrointestinal system: Abdomen is nondistended, soft and nontender. No organomegaly or masses felt. Normal bowel sounds heard. Central nervous system: Alert and oriented. No focal neurological deficits. Extremities: No edema, no clubbing ,no  cyanosis Skin: No rashes, lesions or ulcers,no icterus ,no pallor   Data Reviewed: I have personally reviewed following labs and imaging studies  CBC: Recent Labs  Lab 05/31/20 1509 06/01/20 0301  WBC 4.3 3.7*  NEUTROABS 2.8  --   HGB 10.5* 9.3*  HCT 36.7 32.5*  MCV 76.6* 76.7*  PLT 278 462   Basic Metabolic Panel: Recent Labs  Lab 05/31/20 1509 06/01/20 0301  NA 138 139  K 4.3 3.9  CL 104 107  CO2 24 23  GLUCOSE 107* 101*  BUN 14 16  CREATININE 1.11* 1.10*  CALCIUM 9.0 8.4*   GFR: Estimated Creatinine Clearance: 72.2 mL/min (A) (by C-G formula based on SCr of 1.1 mg/dL (H)). Liver Function Tests: Recent Labs  Lab 05/31/20 1509  AST 21  ALT 17  ALKPHOS 43  BILITOT 0.8  PROT 6.8  ALBUMIN 3.9   Recent Labs  Lab 05/31/20 1509  LIPASE 27   No results for input(s): AMMONIA in the last 168 hours. Coagulation Profile: No results for input(s): INR, PROTIME in the last 168 hours. Cardiac Enzymes: No results for input(s): CKTOTAL, CKMB, CKMBINDEX, TROPONINI in the last 168 hours. BNP (last 3 results) No results for input(s): PROBNP in the last 8760 hours. HbA1C: No results for input(s): HGBA1C in the last 72 hours. CBG: No results for input(s): GLUCAP in the last 168 hours. Lipid Profile: No results for input(s): CHOL, HDL, LDLCALC, TRIG, CHOLHDL, LDLDIRECT in the last 72 hours. Thyroid Function Tests: No results for input(s): TSH, T4TOTAL, FREET4, T3FREE, THYROIDAB in the last 72 hours. Anemia Panel: Recent Labs    05/31/20 1842  FERRITIN 5*  TIBC 469*  IRON 37   Sepsis Labs: No results for input(s): PROCALCITON, LATICACIDVEN in the last 168 hours.  Recent Results (from the past 240 hour(s))  Respiratory Panel by RT PCR (Flu A&B, Covid) - Nasopharyngeal Swab     Status: None   Collection Time: 05/31/20  5:18 PM   Specimen: Nasopharyngeal Swab  Result Value Ref Range Status   SARS Coronavirus 2 by RT PCR NEGATIVE NEGATIVE Final    Comment:  (NOTE) SARS-CoV-2 target nucleic acids are NOT DETECTED.  The SARS-CoV-2 RNA is generally detectable in upper respiratoy specimens during the acute phase of infection. The lowest concentration of SARS-CoV-2 viral copies this assay can detect is 131 copies/mL. A negative result does not preclude SARS-Cov-2 infection and should not be used as the sole basis for treatment or other patient management decisions. A negative result may occur with  improper specimen collection/handling, submission of specimen other than nasopharyngeal swab, presence of viral mutation(s) within the areas targeted by this assay, and inadequate number of viral copies (<131 copies/mL). A negative result must be combined with  clinical observations, patient history, and epidemiological information. The expected result is Negative.  Fact Sheet for Patients:  PinkCheek.be  Fact Sheet for Healthcare Providers:  GravelBags.it  This test is no t yet approved or cleared by the [place] FDA and  has been authorized for detection and/or diagnosis of SARS-CoV-2 by FDA under an Emergency Use Authorization (EUA). This EUA will remain  in effect (meaning this test can be used) for the duration of the COVID-19 declaration under Section 564(b)(1) of the Act, 21 U.S.C. section 360bbb-3(b)(1), unless the authorization is terminated or revoked sooner.     Influenza A by PCR NEGATIVE NEGATIVE Final   Influenza B by PCR NEGATIVE NEGATIVE Final    Comment: (NOTE) The Xpert Xpress SARS-CoV-2/FLU/RSV assay is intended as an aid in  the diagnosis of influenza from Nasopharyngeal swab specimens and  should not be used as a sole basis for treatment. Nasal washings and  aspirates are unacceptable for Xpert Xpress SARS-CoV-2/FLU/RSV  testing.  Fact Sheet for Patients: PinkCheek.be  Fact Sheet for Healthcare  Providers: GravelBags.it  This test is not yet approved or cleared by the [place] FDA and  has been authorized for detection and/or diagnosis of SARS-CoV-2 by  FDA under an Emergency Use Authorization (EUA). This EUA will remain  in effect (meaning this test can be used) for the duration of the  Covid-19 declaration under Section 564(b)(1) of the Act, 21  U.S.C. section 360bbb-3(b)(1), unless the authorization is  terminated or revoked. Performed at [facility], [place] [address] [place], [place] [address]   Culture, blood (routine x 2)     Status: None (Preliminary result)   Collection Time: 05/31/20  6:41 PM   Specimen: BLOOD RIGHT HAND  Result Value Ref Range Status   Specimen Description   Final    BLOOD RIGHT HAND Performed at [place] [address], [place], [place] [address]    Special Requests   Final    BOTTLES DRAWN AEROBIC AND ANAEROBIC Blood Culture adequate volume Performed at [facility] [address] [place], [place] [address]    Culture   Final    NO GROWTH < 12 HOURS Performed at [facility] [address], [place], [place] [address]    Report Status PENDING  Incomplete  Culture, blood (routine x 2)     Status: None (Preliminary result)   Collection Time: 05/31/20  6:41 PM   Specimen: BLOOD LEFT HAND  Result Value Ref Range Status   Specimen Description   Final    BLOOD LEFT HAND Performed at [place] [facility] [address] [place], [place] [address]    Special Requests   Final    BOTTLES DRAWN AEROBIC ONLY Blood Culture adequate volume Performed at [facility] [address] [place], [place] [address]    Culture   Final    NO GROWTH < 12 HOURS Performed at [facility] [address], [place], [place] [address]    Report Status PENDING  Incomplete         Radiology Studies: CT Renal Stone  Study  Result Date: 05/31/2020 CLINICAL DATA:  Flank pain, hematuria EXAM: CT ABDOMEN AND PELVIS WITHOUT CONTRAST TECHNIQUE: Multidetector CT imaging of the abdomen and pelvis was performed following the standard protocol without IV contrast. COMPARISON:  11/02/2019 FINDINGS: Lower chest: No acute abnormality. Hepatobiliary: No focal liver abnormality is seen. No gallstones, gallbladder wall thickening, or  biliary dilatation. Pancreas: Unremarkable. Spleen: Unremarkable. Adrenals/Urinary Tract: Adrenals are unremarkable. There are no renal calculi. No hydronephrosis. No ureteral calculi. Bladder is poorly evaluated due to limited distension. Stomach/Bowel: Stomach is within normal limits. Bowel is normal in caliber. Distal colonic diverticulosis. Normal appendix. Vascular/Lymphatic: Minor aortic atherosclerosis. There are no enlarged lymph nodes identified. Reproductive: Enlarged, likely fibroid uterus.  No adnexal mass. Other: No ascites.  Abdominal wall is unremarkable. Musculoskeletal: Lower lumbar spine degenerative changes. IMPRESSION: No acute abnormality or findings to account for reported symptoms. Electronically Signed   By: Macy Mis M.D.   On: 05/31/2020 16:36        Scheduled Meds: . enoxaparin (LOVENOX) injection  40 mg Subcutaneous Q24H  . ferrous sulfate  325 mg Oral Daily  . phenazopyridine  200 mg Oral TID   Continuous Infusions: . sodium chloride 75 mL/hr at 05/31/20 2203  . cefTRIAXone (ROCEPHIN)  IV       LOS: 0 days    Time spent: More than 50% of that time was spent in counseling and/or coordination of care.      Shelly Coss, MD [facility] P10/26/2021, 11:19 AM

## 2020-06-02 LAB — URINE CULTURE: Culture: 70000 — AB

## 2020-06-02 MED ORDER — OXYBUTYNIN CHLORIDE 5 MG PO TABS: 5.0000 mg | ORAL_TABLET | Freq: Three times a day (TID) | ORAL | 0 refills | Status: DC | PRN

## 2020-06-02 MED ORDER — ALBUTEROL SULFATE HFA 108 (90 BASE) MCG/ACT IN AERS
1.0000 | INHALATION_SPRAY | Freq: Four times a day (QID) | RESPIRATORY_TRACT | 1 refills | Status: DC | PRN
Start: 2020-06-02 — End: 2020-06-17

## 2020-06-02 MED ORDER — TRAMADOL HCL 50 MG PO TABS: 50.0000 mg | ORAL_TABLET | Freq: Four times a day (QID) | ORAL | 0 refills | Status: DC | PRN

## 2020-06-02 MED ORDER — CEPHALEXIN 500 MG PO CAPS: 500.0000 mg | ORAL_CAPSULE | Freq: Three times a day (TID) | ORAL | 0 refills | Status: AC

## 2020-06-02 NOTE — Plan of Care (Signed)

## 2020-06-02 NOTE — Plan of Care (Signed)
  Problem: Urinary Elimination: Goal: Signs and symptoms of infection will decrease Outcome: Adequate for Discharge   Problem: Education: Goal: Knowledge of General Education information will improve Description: Including pain rating scale, medication(s)/side effects and non-pharmacologic comfort measures Outcome: Adequate for Discharge   Problem: Health Behavior/Discharge Planning: Goal: Ability to manage health-related needs will improve Outcome: Adequate for Discharge   Problem: Clinical Measurements: Goal: Ability to maintain clinical measurements within normal limits will improve Outcome: Adequate for Discharge Goal: Will remain free from infection Outcome: Adequate for Discharge Goal: Diagnostic test results will improve Outcome: Adequate for Discharge Goal: Respiratory complications will improve Outcome: Adequate for Discharge Goal: Cardiovascular complication will be avoided Outcome: Adequate for Discharge   Problem: Activity: Goal: Risk for activity intolerance will decrease Outcome: Adequate for Discharge   Problem: Nutrition: Goal: Adequate nutrition will be maintained Outcome: Adequate for Discharge   Problem: Coping: Goal: Level of anxiety will decrease Outcome: Adequate for Discharge   Problem: Elimination: Goal: Will not experience complications related to bowel motility Outcome: Adequate for Discharge Goal: Will not experience complications related to urinary retention Outcome: Adequate for Discharge   Problem: Pain Managment: Goal: General experience of comfort will improve Outcome: Adequate for Discharge   Problem: Safety: Goal: Ability to remain free from injury will improve Outcome: Adequate for Discharge   Problem: Skin Integrity: Goal: Risk for impaired skin integrity will decrease Outcome: Adequate for Discharge

## 2020-06-02 NOTE — Progress Notes (Signed)
Patient discharged via wheelchair accompanied by NT  staff. Patient is alert and in no acute distress. Vital sign taken and recorded. AVS education given, pt. Is properly instructed,and  pt.verbalized understanding. All patient belonging are with the patient.

## 2020-06-02 NOTE — Discharge Summary (Signed)
Physician Discharge Summary  Alexis Beck PNT:[id number] DOB: Feb 08, 1976 DOA: 05/31/2020  PCP: Patient, No Pcp Per  Admit date: 05/31/2020 Discharge date: 06/02/2020  Admitted From: Home Disposition:  Home  Discharge Condition:Stable CODE STATUS:FULL Diet recommendation:Regular  Brief/Interim Summary:  Alexis Milleris a 44 y.o.femalewith medical history significant ofrecurrent UTIs who presented to the emergency department for the evaluation of persistent suprapubic pain,increased frequency of urination, urgency, dysuria and also reported red urine. Patient developedurinary tract symptomsand she was prescribed Bactrim recently via telemedicinewhich she was taking but it did not help.  On presentation, UA was suggestive of UTI.  She was started on IV antibiotics.  Urine culture did not show any significant growth because she was already on antibiotics at home.  Hospital course remarkable for persistent dysuria/bladder spasms which improved with pain medications, oxybutynin.  Urology recommended outpatient follow-up.  Her symptoms are better today.  She is medically stable for discharge  Following problems were addressed during her hospitalization:  UTI not responding to outpatient antibiotic/possiblepyelonephritis: Presented with severe lower abdominal pain, increased frequency, urgency, dysuria. Also reported red urine. No report of fever or chills. She was prescribed Bactrim by televisit which did not help. Patient was admitted for the management of persistent UTI not responding to outpatient antibiotic. She was started on ceftriaxone. CT renal stone study did not show significant findings.  She does not have any suprapubic tenderness or back pain.  She feels great but is still has some contraction of the bladder.  Started on oxybutynin with improvement.Urology recommended outpatient follow-up.   Microcytic anemia: Has history of menorrhagia. Takes iron supplements.I  will recommend her to follow-up with GYN as an outpatient  Mild GQQ:PYPP oral intake at home.treated with IV fluids.  Obesity:BMI of 33.6  Discharge Diagnoses:  Principal Problem:   Acute lower UTI Active Problems:   UTI (urinary tract infection)    Discharge Instructions  Discharge Instructions    Diet general   Complete by: As directed    Discharge instructions   Complete by: As directed    1)Please follow up with a PCP in 1-2 weeks 2)Follow up with urology as an outpatient in a week.  Name and number of the provider group has been attached. 3)Take prescribed medications as instructed.   Increase activity slowly   Complete by: As directed      Allergies as of 06/02/2020      Reactions   Shellfish Allergy Anaphylaxis, Hives      Medication List    STOP taking these medications   Bactrim DS 800-160 MG tablet Generic drug: sulfamethoxazole-trimethoprim   nitrofurantoin (macrocrystal-monohydrate) 100 MG capsule Commonly known as: MACROBID   ondansetron 4 MG disintegrating tablet Commonly known as: Zofran ODT   phenazopyridine 200 MG tablet Commonly known as: PYRIDIUM     TAKE these medications   albuterol 108 (90 Base) MCG/ACT inhaler Commonly known as: VENTOLIN HFA Inhale 1-2 puffs into the lungs every 6 (six) hours as needed for wheezing or shortness of breath.   cephALEXin 500 MG capsule Commonly known as: KEFLEX Take 1 capsule (500 mg total) by mouth 3 (three) times daily for 3 days.   ferrous sulfate 325 (65 FE) MG tablet Take 325 mg by mouth daily.   ibuprofen 200 MG tablet Commonly known as: ADVIL Take 1,000 mg by mouth every 6 (six) hours as needed for moderate pain.   MULTIVITAMIN ADULT PO Take 1 tablet by mouth daily.   oxybutynin 5 MG tablet Commonly known as: DITROPAN Take  1 tablet (5 mg total) by mouth every 8 (eight) hours as needed for bladder spasms.   traMADol 50 MG tablet Commonly known as: ULTRAM Take 1 tablet (50 mg  total) by mouth every 6 (six) hours as needed for severe pain.       Follow-up Information    [facility]. Schedule an appointment as soon as possible for a visit in 1 week(s).   Contact information: [place] [phone]             Allergies  Allergen Reactions  . Shellfish Allergy Anaphylaxis and Hives    Consultations:  None   Procedures/Studies: US RENAL  Result Date: 06/01/2020 CLINICAL DATA:  Dysuria. EXAM: RENAL / URINARY TRACT ULTRASOUND COMPLETE COMPARISON:  Noncontrast abdominopelvic CT yesterday. FINDINGS: Right Kidney: Renal measurements: 10.3 x 4.5 x 4.7 cm = volume: 114 mL. Echogenicity within normal limits. No mass or hydronephrosis visualized. No echogenic renal calculi. Left Kidney: Renal measurements: 10.5 x 5.5 x 5.2 cm = volume: 157 mL. Echogenicity within normal limits. No mass or hydronephrosis visualized. No echogenic renal calculi. Bladder: Near completely empty, the patient voided 10 minutes prior to the exam. The bladder is not well assessed. Other: None. IMPRESSION: 1. Unremarkable sonographic appearance of the kidneys. 2. Urinary bladder is nearly empty and not well assessed. Electronically Signed   By: Keith Rake M.D.   On: 06/01/2020 20:15   CT Renal Stone Study  Result Date: 05/31/2020 CLINICAL DATA:  Flank pain, hematuria EXAM: CT ABDOMEN AND PELVIS WITHOUT CONTRAST TECHNIQUE: Multidetector CT imaging of the abdomen and pelvis was performed following the standard protocol without IV contrast. COMPARISON:  11/02/2019 FINDINGS: Lower chest: No acute abnormality. Hepatobiliary: No focal liver abnormality is seen. No gallstones, gallbladder wall thickening, or biliary dilatation. Pancreas: Unremarkable. Spleen: Unremarkable. Adrenals/Urinary Tract: Adrenals are unremarkable. There are no renal calculi. No hydronephrosis. No ureteral calculi. Bladder is poorly evaluated due to limited  distension. Stomach/Bowel: Stomach is within normal limits. Bowel is normal in caliber. Distal colonic diverticulosis. Normal appendix. Vascular/Lymphatic: Minor aortic atherosclerosis. There are no enlarged lymph nodes identified. Reproductive: Enlarged, likely fibroid uterus.  No adnexal mass. Other: No ascites.  Abdominal wall is unremarkable. Musculoskeletal: Lower lumbar spine degenerative changes. IMPRESSION: No acute abnormality or findings to account for reported symptoms. Electronically Signed   By: Macy Mis M.D.   On: 05/31/2020 16:36      Subjective: Patient seen and examined at the bedside this morning.  Medically stable for discharge today.  Discharge Exam: Vitals:   06/01/20 2101 06/02/20 0342  BP: 137/89 121/60  Pulse: 79 74  Resp: 17 17  Temp: 98.2 F (36.8 C) 98.2 F (36.8 C)  SpO2: 98% 98%   Vitals:   06/01/20 0922 06/01/20 1558 06/01/20 2101 06/02/20 0342  BP: 107/86 126/64 137/89 121/60  Pulse: 75 86 79 74  Resp: 20 20 17 17   Temp: 98.1 F (36.7 C) 98.2 F (36.8 C) 98.2 F (36.8 C) 98.2 F (36.8 C)  TempSrc: Oral Oral    SpO2: 97% 98% 98% 98%  Weight:      Height:        General: Pt is alert, awake, not in acute distress Cardiovascular: RRR, S1/S2 +, no rubs, no gallops Respiratory: CTA bilaterally, no wheezing, no rhonchi Abdominal: Soft, NT, ND, bowel sounds + Extremities: no edema, no cyanosis    The results of significant diagnostics from this hospitalization (including imaging, microbiology,  ancillary and laboratory) are listed below for reference.     Microbiology: Recent Results (from the past 240 hour(s))  Urine Culture     Status: Abnormal   Collection Time: 05/29/20  9:35 AM   Specimen: Urine  Result Value Ref Range Status   Specimen Description   Final    Urine Performed at [place] [address] [place], [place] [address]    Special Requests   Final    NONE Performed at [facility], [place] [address] [place], [place] [address]    Culture 70,000 COLONIES/mL ESCHERICHIA COLI (A)  Final   Report Status 06/02/2020 FINAL  Final   Organism ID, Bacteria ESCHERICHIA COLI (A)  Final      Susceptibility   Escherichia coli - MIC*    AMPICILLIN 4 SENSITIVE Sensitive     CEFAZOLIN <=4 SENSITIVE Sensitive     CEFEPIME <=0.12 SENSITIVE Sensitive     CEFTAZIDIME <=1 SENSITIVE Sensitive     CEFTRIAXONE <=0.25 SENSITIVE Sensitive     CIPROFLOXACIN <=0.25 SENSITIVE Sensitive     GENTAMICIN <=1 SENSITIVE Sensitive     IMIPENEM <=0.25 SENSITIVE Sensitive     TRIMETH/SULFA >=320 RESISTANT Resistant     AMPICILLIN/SULBACTAM <=2 SENSITIVE Sensitive     PIP/TAZO <=4 SENSITIVE Sensitive     * 70,000 COLONIES/mL ESCHERICHIA COLI  Urine culture     Status: Abnormal   Collection Time: 05/31/20  2:27 PM   Specimen: Urine, Clean Catch  Result Value Ref Range Status   Specimen Description   Final    URINE, CLEAN CATCH Performed at [facility], [place] [address] [place], [place] [address]    Special Requests   Final    NONE Performed at [facility], [place] [address] [place], [place] [address]    Culture (A)  Final    <10,000 COLONIES/mL INSIGNIFICANT GROWTH Performed at [place] [address], [place],  [address]    Report Status 06/01/2020 FINAL  Final  Respiratory Panel by RT PCR (Flu A&B, Covid) - Nasopharyngeal Swab     Status: None   Collection Time: 05/31/20  5:18 PM   Specimen: Nasopharyngeal Swab  Result Value Ref Range Status   SARS Coronavirus 2 by RT PCR NEGATIVE NEGATIVE Final    Comment: (NOTE) SARS-CoV-2 target nucleic acids are NOT DETECTED.  The SARS-CoV-2 RNA is generally detectable in upper respiratoy specimens during the acute phase of infection. The lowest concentration of SARS-CoV-2 viral copies this assay can detect is 131 copies/mL. A negative result does not preclude SARS-Cov-2 infection and  should not be used as the sole basis for treatment or other patient management decisions. A negative result may occur with  improper specimen collection/handling, submission of specimen other than nasopharyngeal swab, presence of viral mutation(s) within the areas targeted by this assay, and inadequate number of viral copies (<131 copies/mL). A negative result must be combined with clinical observations, patient history, and epidemiological information. The expected result is Negative.  Fact Sheet for Patients:  PinkCheek.be  Fact Sheet for Healthcare Providers:  GravelBags.it  This test is no t yet approved or cleared by the [place] FDA and  has been authorized for detection and/or diagnosis of SARS-CoV-2 by FDA under an Emergency Use Authorization (EUA). This EUA will remain  in effect (meaning this test can be used) for the duration of the COVID-19 declaration under Section 564(b)(1) of the Act, 21 U.S.C. section 360bbb-3(b)(1),  unless the authorization is terminated or revoked sooner.     Influenza A by PCR NEGATIVE NEGATIVE Final   Influenza B by PCR NEGATIVE NEGATIVE Final    Comment: (NOTE) The Xpert Xpress SARS-CoV-2/FLU/RSV assay is intended as an aid in  the diagnosis of influenza from Nasopharyngeal swab specimens and  should not be used as a sole basis for treatment. Nasal washings and  aspirates are unacceptable for Xpert Xpress SARS-CoV-2/FLU/RSV  testing.  Fact Sheet for Patients: PinkCheek.be  Fact Sheet for Healthcare Providers: GravelBags.it  This test is not yet approved or cleared by the [place] FDA and  has been authorized for detection and/or diagnosis of SARS-CoV-2 by  FDA under an Emergency Use Authorization (EUA). This EUA will remain  in effect (meaning this test can be used) for the duration of the  Covid-19 declaration  under Section 564(b)(1) of the Act, 21  U.S.C. section 360bbb-3(b)(1), unless the authorization is  terminated or revoked. Performed at [facility], [place] [address], [place], [place] [address]   Culture, blood (routine x 2)     Status: None (Preliminary result)   Collection Time: 05/31/20  6:41 PM   Specimen: BLOOD RIGHT HAND  Result Value Ref Range Status   Specimen Description   Final    BLOOD RIGHT HAND Performed at [facility] [address], [place], [place] [address]    Special Requests   Final    BOTTLES DRAWN AEROBIC AND ANAEROBIC Blood Culture adequate volume Performed at [facility] [address], [place], [place] [address]    Culture   Final    NO GROWTH 2 DAYS Performed at [facility] [place] [address] [place], [place] [address]    Report Status PENDING  Incomplete  Culture, blood (routine x 2)     Status: None (Preliminary result)   Collection Time: 05/31/20  6:41 PM   Specimen: BLOOD LEFT HAND  Result Value Ref Range Status   Specimen Description   Final    BLOOD LEFT HAND Performed at [facility] [address], [place], [place] [address]    Special Requests   Final    BOTTLES DRAWN AEROBIC ONLY Blood Culture adequate volume Performed at [facility] [address], [place], [place] [address]    Culture   Final    NO GROWTH 2 DAYS Performed at [facility] [address] [place], [place] [address]    Report Status PENDING  Incomplete     Labs: BNP (last 3 results) No results for input(s): BNP in the last 8760 hours. Basic Metabolic Panel: Recent Labs  Lab 05/31/20 1509 06/01/20 0301  NA 138 139  K 4.3 3.9  CL 104 107  CO2 24 23  GLUCOSE 107* 101*  BUN 14 16  CREATININE 1.11* 1.10*  CALCIUM 9.0 8.4*   Liver Function Tests: Recent Labs  Lab 05/31/20 1509  AST 21  ALT 17  ALKPHOS 43  BILITOT 0.8  PROT 6.8  ALBUMIN 3.9    Recent Labs  Lab 05/31/20 1509  LIPASE 27   No results for input(s): AMMONIA in the last 168 hours. CBC: Recent Labs  Lab 05/31/20 1509 06/01/20 0301  WBC 4.3 3.7*  NEUTROABS 2.8  --   HGB 10.5* 9.3*  HCT 36.7 32.5*  MCV 76.6* 76.7*  PLT 278 248   Cardiac Enzymes: No results for input(s): CKTOTAL, CKMB, CKMBINDEX, TROPONINI in the last  168 hours. BNP: Invalid input(s): POCBNP CBG: No results for input(s): GLUCAP in the last 168 hours. D-Dimer No results for input(s): DDIMER in the last 72 hours. Hgb A1c No results for input(s): HGBA1C in the last 72 hours. Lipid Profile No results for input(s): CHOL, HDL, LDLCALC, TRIG, CHOLHDL, LDLDIRECT in the last 72 hours. Thyroid function studies No results for input(s): TSH, T4TOTAL, T3FREE, THYROIDAB in the last 72 hours.  Invalid input(s): FREET3 Anemia work up Recent Labs    05/31/20 1842  FERRITIN 5*  TIBC 469*  IRON 37   Urinalysis    Component Value Date/Time   COLORURINE RED (A) 05/31/2020 1427   APPEARANCEUR CLOUDY (A) 05/31/2020 1427   LABSPEC 1.019 05/31/2020 1427   PHURINE 5.0 05/31/2020 1427   GLUCOSEU NEGATIVE 05/31/2020 1427   HGBUR LARGE (A) 05/31/2020 1427   BILIRUBINUR NEGATIVE 05/31/2020 1427   [place] 05/31/2020 1427   PROTEINUR >=300 (A) 05/31/2020 1427   UROBILINOGEN 0.2 02/18/2019 1438   NITRITE POSITIVE (A) 05/31/2020 1427   LEUKOCYTESUR NEGATIVE 05/31/2020 1427   Sepsis Labs Invalid input(s): PROCALCITONIN,  WBC,  LACTICIDVEN Microbiology Recent Results (from the past 240 hour(s))  Urine Culture     Status: Abnormal   Collection Time: 05/29/20  9:35 AM   Specimen: Urine  Result Value Ref Range Status   Specimen Description   Final    Urine Performed at [facility], [place] [address], [place], [place] [address]    Special Requests   Final    NONE Performed at [facility], [place] [address] [place], [place] [address]    Culture  70,000 COLONIES/mL ESCHERICHIA COLI (A)  Final   Report Status 06/02/2020 FINAL  Final   Organism ID, Bacteria ESCHERICHIA COLI (A)  Final      Susceptibility   Escherichia coli - MIC*    AMPICILLIN 4 SENSITIVE Sensitive     CEFAZOLIN <=4 SENSITIVE Sensitive     CEFEPIME <=0.12 SENSITIVE Sensitive     CEFTAZIDIME <=1 SENSITIVE Sensitive     CEFTRIAXONE <=0.25 SENSITIVE Sensitive     CIPROFLOXACIN <=0.25 SENSITIVE Sensitive     GENTAMICIN <=1 SENSITIVE Sensitive     IMIPENEM <=0.25 SENSITIVE Sensitive     TRIMETH/SULFA >=320 RESISTANT Resistant     AMPICILLIN/SULBACTAM <=2 SENSITIVE Sensitive     PIP/TAZO <=4 SENSITIVE Sensitive     * 70,000 COLONIES/mL ESCHERICHIA COLI  Urine culture     Status: Abnormal   Collection Time: 05/31/20  2:27 PM   Specimen: Urine, Clean Catch  Result Value Ref Range Status   Specimen Description   Final    URINE, CLEAN CATCH Performed at [facility], [place] [address] [place], [place] [address]    Special Requests   Final    NONE Performed at [facility], [place] [address] [place], [place] [address]    Culture (A)  Final    <10,000 COLONIES/mL INSIGNIFICANT GROWTH Performed at [facility] [address] [place], [place] [address]    Report Status 06/01/2020 FINAL  Final  Respiratory Panel by RT PCR (Flu A&B, Covid) - Nasopharyngeal Swab     Status: None   Collection Time: 05/31/20  5:18 PM   Specimen: Nasopharyngeal Swab  Result Value Ref Range Status   SARS Coronavirus 2 by RT PCR NEGATIVE NEGATIVE Final    Comment: (NOTE) SARS-CoV-2 target nucleic acids are NOT DETECTED.  The SARS-CoV-2 RNA is generally detectable in upper  respiratoy specimens during the acute phase of infection. The lowest concentration of SARS-CoV-2 viral copies this assay can detect is 131 copies/mL. A negative result does not preclude SARS-Cov-2 infection and should not be used as the sole basis for treatment or other  patient management decisions. A negative result may occur with  improper specimen collection/handling, submission of specimen other than nasopharyngeal swab, presence of viral mutation(s) within the areas targeted by this assay, and inadequate number of viral copies (<131 copies/mL). A negative result must be combined with clinical observations, patient history, and epidemiological information. The expected result is Negative.  Fact Sheet for Patients:  PinkCheek.be  Fact Sheet for Healthcare Providers:  GravelBags.it  This test is no t yet approved or cleared by the [place] FDA and  has been authorized for detection and/or diagnosis of SARS-CoV-2 by FDA under an Emergency Use Authorization (EUA). This EUA will remain  in effect (meaning this test can be used) for the duration of the COVID-19 declaration under Section 564(b)(1) of the Act, 21 U.S.C. section 360bbb-3(b)(1), unless the authorization is terminated or revoked sooner.     Influenza A by PCR NEGATIVE NEGATIVE Final   Influenza B by PCR NEGATIVE NEGATIVE Final    Comment: (NOTE) The Xpert Xpress SARS-CoV-2/FLU/RSV assay is intended as an aid in  the diagnosis of influenza from Nasopharyngeal swab specimens and  should not be used as a sole basis for treatment. Nasal washings and  aspirates are unacceptable for Xpert Xpress SARS-CoV-2/FLU/RSV  testing.  Fact Sheet for Patients: PinkCheek.be  Fact Sheet for Healthcare Providers: GravelBags.it  This test is not yet approved or cleared by the [place] FDA and  has been authorized for detection and/or diagnosis of SARS-CoV-2 by  FDA under an Emergency Use Authorization (EUA). This EUA will remain  in effect (meaning this test can be used) for the duration of the  Covid-19 declaration under Section 564(b)(1) of the Act, 21  U.S.C. section  360bbb-3(b)(1), unless the authorization is  terminated or revoked. Performed at [facility], [place] [address], [place], [place] [address]   Culture, blood (routine x 2)     Status: None (Preliminary result)   Collection Time: 05/31/20  6:41 PM   Specimen: BLOOD RIGHT HAND  Result Value Ref Range Status   Specimen Description   Final    BLOOD RIGHT HAND Performed at [facility] [place] [address] [place], [place] [address]    Special Requests   Final    BOTTLES DRAWN AEROBIC AND ANAEROBIC Blood Culture adequate volume Performed at [facility] [address] [place], [place] [address]    Culture   Final    NO GROWTH 2 DAYS Performed at [facility] [address], [place], [place] [address]    Report Status PENDING  Incomplete  Culture, blood (routine x 2)     Status: None (Preliminary result)   Collection Time: 05/31/20  6:41 PM   Specimen: BLOOD LEFT HAND  Result Value Ref Range Status   Specimen Description   Final    BLOOD LEFT HAND Performed at  [address], [place], [place] [address]    Special Requests   Final    BOTTLES DRAWN AEROBIC ONLY Blood Culture adequate volume Performed at [facility] [address], [place],  [address]    Culture   Final    NO GROWTH 2 DAYS Performed at [facility], [address], [place], [place] [address]    Report Status PENDING  Incomplete    Please note: You were cared for by a hospitalist during your hospital stay. Once you are discharged, your primary care physician will handle any further medical issues. Please note that NO REFILLS for any discharge medications will be authorized once you are discharged, as it is imperative that you return to your primary care physician (or establish a relationship with a primary care physician if you do not have one) for your post hospital discharge needs so that  they can reassess your need for medications and monitor your lab values.    Time coordinating discharge: 40 minutes  SIGNED:   Shelly Coss, MD  [facility] 06/02/2020, 10:28 AM Pager [phone]  If 7PM-7AM, please contact night-coverage [email or web address] Password [email or web address]

## 2020-06-03 ENCOUNTER — Telehealth: Payer: Self-pay

## 2020-06-03 NOTE — Telephone Encounter (Signed)
Post ED Visit - Positive Culture Follow-up  Culture report reviewed by antimicrobial stewardship pharmacist: [facility] Team []  Elenor Quinones, Pharm.D. []  Heide Guile, Pharm.D., BCPS AQ-ID []  Parks Neptune, Pharm.D., BCPS []  Alycia Rossetti, Pharm.D., BCPS []  Enigma, [place].D., BCPS, AAHIVP []  Legrand Como, Pharm.D., BCPS, AAHIVP []  Salome Arnt, PharmD, BCPS []  Johnnette Gourd, PharmD, BCPS []  Hughes Better, PharmD, BCPS []  Leeroy Cha, PharmD []  Laqueta [place], PharmD, BCPS []  Albertina Parr, PharmD  [facility] Team []  Leodis Sias, PharmD []  [place]ell Spar, PharmD []  Royetta Asal, PharmD []  Graylin Shiver, Rph []  Rema Fendt) Glennon Mac, PharmD []  Arlyn Dunning, PharmD []  Netta Cedars, PharmD []  Dia Sitter, PharmD []  Leone Haven, PharmD []  Gretta Arab, PharmD []  Theodis Shove, PharmD []  Peggyann Juba, PharmD []  Reuel Boom, PharmD [facility]  Positive urine culture  and no further patient follow-up is required at this time.  Genia Del 06/03/2020, 4:21 PM

## 2020-06-05 LAB — CULTURE, BLOOD (ROUTINE X 2)
Culture: NO GROWTH
Culture: NO GROWTH
Special Requests: ADEQUATE
Special Requests: ADEQUATE

## 2020-06-16 NOTE — Progress Notes (Signed)
Patient ID: Alexis Beck, female   DOB: 07-22-1976, 44 y.o.   MRN: [id number]     Alexis Beck, is a 44 y.o. female  WUJ:[id number]  [id number]  DOB - 1975/08/20  Subjective:  Chief Complaint and HPI: Alexis Beck is a 44 y.o. female here today to establish care and for a follow up visit After hospitalization for pyelonephritis 10/25-10/27/2021.  She is feeling much better.  Energy levels improving.  Appetite is good.  No fever.  No abdominal pain.  PTA she was not taking her iron daily, but now she is.    PMH remarkable for asthma.  She was previously on Advair/daily meds but has not had insurance in years.  She is using her albuterol inhaler 5-6 times daily.  Non-smoker.    From discharge summary: Brief/Interim Summary:  Alexis Beck a 44 y.o.femalewith medical history significant ofrecurrent UTIs who presented to the emergency department for the evaluation of persistent suprapubic pain,increased frequency of urination, urgency, dysuria and also reported red urine. Patient developedurinary tract symptomsand she was prescribed Bactrim recently via telemedicinewhich she was taking but it did not help.On presentation, UA was suggestive of UTI. She was started on IV antibiotics.  Urine culture did not show any significant growth because she was already on antibiotics at home.  Hospital course remarkable for persistent dysuria/bladder spasms which improved with pain medications, oxybutynin.  Urology recommended outpatient follow-up.  Her symptoms are better today.  She is medically stable for discharge  Following problems were addressed during her hospitalization:  UTI not responding to outpatient antibiotic/possiblepyelonephritis: Presented with severe lower abdominal pain, increased frequency, urgency, dysuria. Also reported red urine. No report of fever or chills. She was prescribed Bactrim by televisit which did not help. Patient was admitted for the management  of persistent UTI not responding to outpatient antibiotic. She was started on ceftriaxone. CT renal stone study did not show significant findings.She does not have any suprapubic tenderness or back pain. She feels great but is still has some contraction of the bladder. Started on oxybutynin with improvement.Urology recommended outpatient follow-up.   Microcytic anemia: Has history of menorrhagia. Takes iron supplements.I will recommend her to follow-up with GYN as an outpatient  Mild ION:GEXB oral intake at home.treated with IV fluids.  Obesity:BMI of 33.6   ED/Hospital notes reviewed.    ROS:   Constitutional:  No f/c, No night sweats, No unexplained weight loss. EENT:  No vision changes, No blurry vision, No hearing changes. No mouth, throat, or ear problems.  Respiratory: No cough, No SOB/+wheezing Cardiac: No CP, no palpitations GI:  No abd pain, No N/V/D. GU: No Urinary s/sx Musculoskeletal: No joint pain Neuro: No headache, no dizziness, no motor weakness.  Skin: No rash Endocrine:  No polydipsia. No polyuria.  Psych: Denies SI/HI  No problems updated.  ALLERGIES: Allergies  Allergen Reactions  . Shellfish Allergy Anaphylaxis and Hives    PAST MEDICAL HISTORY: Past Medical History:  Diagnosis Date  . Anxiety   . Asthma   . Bipolar disorder ([place])   . Depression     MEDICATIONS AT HOME: Prior to Admission medications   Medication Sig Start Date End Date Taking? Authorizing Provider  albuterol (VENTOLIN HFA) 108 (90 Base) MCG/ACT inhaler Inhale 1-2 puffs into the lungs every 6 (six) hours as needed for wheezing or shortness of breath. 06/17/20  Yes Argentina Donovan, PA-C  ferrous sulfate 325 (65 FE) MG tablet Take 325 mg by mouth daily.   Yes [provider]  ibuprofen (ADVIL) 200 MG tablet Take 1,000 mg by mouth every 6 (six) hours as needed for moderate pain.   Yes [provider]  Multiple Vitamin (MULTIVITAMIN ADULT PO) Take 1  tablet by mouth daily.   Yes [provider]  Fluticasone-Salmeterol (ADVAIR DISKUS) 250-50 MCG/DOSE AEPB Inhale 1 puff into the lungs 2 (two) times daily. 06/17/20   Argentina Donovan, PA-C  oxybutynin (DITROPAN) 5 MG tablet Take 1 tablet (5 mg total) by mouth every 8 (eight) hours as needed for bladder spasms. Patient not taking: Reported on 06/17/2020 06/02/20   Shelly Coss, MD  traMADol (ULTRAM) 50 MG tablet Take 1 tablet (50 mg total) by mouth every 6 (six) hours as needed for severe pain. Patient not taking: Reported on 06/17/2020 06/02/20   Shelly Coss, MD  cetirizine (ZYRTEC) 10 MG tablet Take 1 tablet (10 mg total) by mouth daily. Patient not taking: Reported on 11/02/2019 10/17/17 11/02/19  Maczis, Barth Kirks, PA-C  fluticasone [facility]) 50 MCG/ACT nasal spray Place 2 sprays into both nostrils daily. Patient not taking: Reported on 11/02/2019 10/17/17 11/02/19  Jillyn Ledger, PA-C     Objective:  EXAM:   Vitals:   06/17/20 0947  BP: 123/79  Pulse: 87  Temp: 98 F (36.7 C)  TempSrc: Temporal  SpO2: 98%  Weight: 202 lb 6.4 oz (91.8 kg)  Height: 5\' 5"  (1.651 m)    General appearance : A&OX3. NAD. Non-toxic-appearing HEENT: Atraumatic and Normocephalic.  PERRLA. EOM intact.  TM clear B. Mouth-MMM, post pharynx WNL w/o erythema, No PND. Neck: supple, no JVD. No cervical lymphadenopathy. No thyromegaly Chest/Lungs:  Breathing-non-labored, Good air entry bilaterally, breath sounds normal without rales, rhonchi, or wheezing  CVS: S1 S2 regular, no murmurs, gallops, rubs  Abdomen: Bowel sounds present, Non tender and not distended with no gaurding, rigidity or rebound. Extremities: Bilateral Lower Ext shows no edema, both legs are warm to touch with = pulse throughout Neurology:  CN II-XII grossly intact, Non focal.   Psych:  TP linear. J/I WNL. Normal speech. Appropriate eye contact and affect.  Skin:  No Rash  Data Review Lab Results  Component Value Date    HGBA1C 6.3 (H) 11/17/2019   HGBA1C 6.4 (H) 10/18/2017     Assessment & Plan   1. Acute pyelonephritis Resolved.  Drink 80-100 ounces water daily.  Urinary hygiene reviewed.   - POCT URINALYSIS DIP (CLINITEK)  2. Encounter for examination following treatment at hospital  3. Anemia, unspecified type Continue iron once daily  4. Moderate persistent asthma, unspecified whether complicated Will resume maintenance/control inhaler so that hopefully she can decrease rescue inhaler usage to <2times/per week.  Follow closely - albuterol (VENTOLIN HFA) 108 (90 Base) MCG/ACT inhaler; Inhale 1-2 puffs into the lungs every 6 (six) hours as needed for wheezing or shortness of breath.  Dispense: 8 g; Refill: 1 - Fluticasone-Salmeterol (ADVAIR DISKUS) 250-50 MCG/DOSE AEPB; Inhale 1 puff into the lungs 2 (two) times daily.  Dispense: 1 each; Refill: 3   Patient have been counseled extensively about nutrition and exercise  Return in about 2 months (around 08/17/2020) for assign PCP and recheck asthma/anemia.  The patient was given clear instructions to go to ER or return to medical center if symptoms don't improve, worsen or new problems develop. The patient verbalized understanding. The patient was told to call to get lab results if they haven't heard anything in the next week.     Freeman Caldron, PA-C [facility] and  [place], [place]   06/17/2020, 10:17 AM

## 2020-06-17 ENCOUNTER — Ambulatory Visit: Payer: Self-pay | Attending: Physician Assistant | Admitting: Physician Assistant

## 2020-06-17 ENCOUNTER — Other Ambulatory Visit: Payer: Self-pay | Admitting: Pharmacist

## 2020-06-17 ENCOUNTER — Encounter: Payer: Self-pay | Admitting: Physician Assistant

## 2020-06-17 ENCOUNTER — Other Ambulatory Visit: Payer: Self-pay

## 2020-06-17 VITALS — BP 123/79 | HR 87 | Temp 98.0°F | Ht 65.0 in | Wt 202.4 lb

## 2020-06-17 DIAGNOSIS — Z09 Encounter for follow-up examination after completed treatment for conditions other than malignant neoplasm: Secondary | ICD-10-CM

## 2020-06-17 DIAGNOSIS — N10 Acute tubulo-interstitial nephritis: Secondary | ICD-10-CM

## 2020-06-17 DIAGNOSIS — J4540 Moderate persistent asthma, uncomplicated: Secondary | ICD-10-CM

## 2020-06-17 DIAGNOSIS — D649 Anemia, unspecified: Secondary | ICD-10-CM

## 2020-06-17 LAB — POCT URINALYSIS DIP (CLINITEK)
Bilirubin, UA: NEGATIVE
Blood, UA: NEGATIVE
Glucose, UA: NEGATIVE mg/dL
Ketones, POC UA: NEGATIVE mg/dL
Leukocytes, UA: NEGATIVE
Nitrite, UA: NEGATIVE
POC PROTEIN,UA: NEGATIVE
Spec Grav, UA: >=1.03 — AB (ref 1.010–1.025)
Urobilinogen, UA: 0.2 E.U./dL
pH, UA: 5.5 (ref 5.0–8.0)

## 2020-06-17 MED ORDER — ALBUTEROL SULFATE HFA 108 (90 BASE) MCG/ACT IN AERS: 1.0000 | INHALATION_SPRAY | Freq: Four times a day (QID) | RESPIRATORY_TRACT | 1 refills | Status: DC | PRN

## 2020-06-17 MED ORDER — FLUTICASONE-SALMETEROL 250-50 MCG/DOSE IN AEPB: 1.0000 | INHALATION_SPRAY | Freq: Two times a day (BID) | RESPIRATORY_TRACT | 3 refills | Status: DC

## 2020-06-17 MED FILL — ALBUTEROL SULFATE HFA 108 (: 108 (90 BAS | 25 days supply | Qty: 18 | Fill #0

## 2020-06-17 MED FILL — FLUTICASONE-SALMETEROL 250-: 250-50 | 30 days supply | Qty: 60 | Fill #0

## 2020-06-17 NOTE — Patient Instructions (Signed)
Drink 80-100 ounces water daily.  Continue taking one iron tablet daily

## 2020-09-03 ENCOUNTER — Ambulatory Visit: Payer: Self-pay | Admitting: Family Medicine

## 2020-09-03 MED FILL — ALBUTEROL SULFATE HFA 108 (: 108 (90 BAS | 25 days supply | Qty: 18 | Fill #1

## 2020-09-15 ENCOUNTER — Other Ambulatory Visit: Payer: Self-pay

## 2020-09-15 ENCOUNTER — Encounter: Payer: Self-pay | Admitting: Family

## 2020-09-15 ENCOUNTER — Ambulatory Visit (INDEPENDENT_AMBULATORY_CARE_PROVIDER_SITE_OTHER): Payer: Self-pay | Admitting: Family

## 2020-09-15 VITALS — BP 141/87 | HR 87 | Wt 204.8 lb

## 2020-09-15 DIAGNOSIS — L80 Vitiligo: Secondary | ICD-10-CM

## 2020-09-15 DIAGNOSIS — Z7689 Persons encountering health services in other specified circumstances: Secondary | ICD-10-CM

## 2020-09-15 DIAGNOSIS — J4540 Moderate persistent asthma, uncomplicated: Secondary | ICD-10-CM

## 2020-09-15 DIAGNOSIS — D259 Leiomyoma of uterus, unspecified: Secondary | ICD-10-CM

## 2020-09-15 DIAGNOSIS — D649 Anemia, unspecified: Secondary | ICD-10-CM

## 2020-09-15 NOTE — Progress Notes (Signed)
Establish care Wants flu

## 2020-09-15 NOTE — Patient Instructions (Addendum)
-Return for annual physical examination, labs, and health maintenance. Arrive fasting meaning having had no food and/or nothing to drink for at least 8 hours prior to appointment. - Albuterol and Advair for asthma.  - Ferrous sulfate for anemia.  - Referral to Gynecology for fibroids uterus.  - Thank you for choosing [facility] at [facility] for your medical home!    Alexis Beck was seen by Camillia Herter, NP today.   Alexis Beck's primary care provider is Janyra Barillas Zachery Dauer, NP.   For the best care possible,  you should try to see Durene Fruits, NP whenever you come to clinic.   We look forward to seeing you again soon!  If you have any questions about your visit today,  please call us at [phone]  Or feel free to reach your provider via [place].     Asthma, Adult  Asthma is a long-term (chronic) condition that causes recurrent episodes in which the airways become tight and narrow. The airways are the passages that lead from the nose and mouth down into the lungs. Asthma episodes, also called asthma attacks, can cause coughing, wheezing, shortness of breath, and chest pain. The airways can also fill with mucus. During an attack, it can be difficult to breathe. Asthma attacks can range from minor to life threatening. Asthma cannot be cured, but medicines and lifestyle changes can help control it and treat acute attacks. What are the causes? This condition is believed to be caused by inherited (genetic) and environmental factors, but its exact cause is not known. There are many things that can bring on an asthma attack or make asthma symptoms worse (triggers). Asthma triggers are different for each person. Common triggers include:  Mold.  Dust.  Cigarette smoke.  Cockroaches.  Things that can cause allergy symptoms (allergens), such as animal dander or pollen from trees or grass.  Air pollutants such as household cleaners, wood smoke, smog, or Advertising account planner.  Cold  air, weather changes, and winds (which increase molds and pollen in the air).  Strong emotional expressions such as crying or laughing hard.  Stress.  Certain medicines (such as aspirin) or types of medicines (such as beta-blockers).  Sulfites in foods and drinks. Foods and drinks that may contain sulfites include dried fruit, potato chips, and sparkling grape juice.  Infections or inflammatory conditions such as the flu, a cold, or inflammation of the nasal membranes (rhinitis).  Gastroesophageal reflux disease (GERD).  Exercise or strenuous activity. What are the signs or symptoms? Symptoms of this condition may occur right after asthma is triggered or many hours later. Symptoms include:  Wheezing. This can sound like whistling when you breathe.  Excessive nighttime or early morning coughing.  Frequent or severe coughing with a common cold.  Chest tightness.  Shortness of breath.  Tiredness (fatigue) with minimal activity. How is this diagnosed? This condition is diagnosed based on:  Your medical history.  A physical exam.  Tests, which may include: ? Lung function studies and pulmonary studies (spirometry). These tests can evaluate the flow of air in your lungs. ? Allergy tests. ? Imaging tests, such as X-rays. How is this treated? There is no cure for this condition, but treatment can help control your symptoms. Treatment for asthma usually involves:  Identifying and avoiding your asthma triggers.  Using medicines to control your symptoms. Generally, two types of medicines are used to treat asthma: ? Controller medicines. These help prevent asthma symptoms from occurring. They are usually taken  every day. ? Fast-acting reliever or rescue medicines. These quickly relieve asthma symptoms by widening the narrow and tight airways. They are used as needed and provide short-term relief.  Using supplemental oxygen. This may be needed during a severe episode.  Using  other medicines, such as: ? Allergy medicines, such as antihistamines, if your asthma attacks are triggered by allergens. ? Immune medicines (immunomodulators). These are medicines that help control the immune system.  Creating an asthma action plan. An asthma action plan is a written plan for managing and treating your asthma attacks. This plan includes: ? A list of your asthma triggers and how to avoid them. ? Information about when medicines should be taken and when their dosage should be changed. ? Instructions about using a device called a peak flow meter. A peak flow meter measures how well the lungs are working and the severity of your asthma. It helps you monitor your condition. Follow these instructions at home: Controlling your home environment Control your home environment in the following ways to help avoid triggers and prevent asthma attacks:  Change your heating and air conditioning filter regularly.  Limit your use of fireplaces and wood stoves.  Get rid of pests (such as roaches and mice) and their droppings.  Throw away plants if you see mold on them.  Clean floors and dust surfaces regularly. Use unscented cleaning products.  Try to have someone else vacuum for you regularly. Stay out of rooms while they are being vacuumed and for a short while afterward. If you vacuum, use a dust mask from a hardware store, a double-layered or microfilter vacuum cleaner bag, or a vacuum cleaner with a HEPA filter.  Replace carpet with wood, tile, or vinyl flooring. Carpet can trap dander and dust.  Use allergy-proof pillows, mattress covers, and box spring covers.  Keep your bedroom a trigger-free room.  Avoid pets and keep windows closed when allergens are in the air.  Wash beddings every week in hot water and dry them in a dryer.  Use blankets that are made of polyester or cotton.  Clean bathrooms and kitchens with bleach. If possible, have someone repaint the walls in these  rooms with mold-resistant paint. Stay out of the rooms that are being cleaned and painted.  Wash your hands often with soap and water. If soap and water are not available, use hand sanitizer.  Do not allow anyone to smoke in your home. General instructions  Take over-the-counter and prescription medicines only as told by your health care provider. ? Speak with your health care provider if you have questions about how or when to take the medicines. ? Make note if you are requiring more frequent dosages.  Do not use any products that contain nicotine or tobacco, such as cigarettes and e-cigarettes. If you need help quitting, ask your health care provider. Also, avoid being exposed to secondhand smoke.  Use a peak flow meter as told by your health care provider. Record and keep track of the readings.  Understand and use the asthma action plan to help minimize, or stop an asthma attack, without needing to seek medical care.  Make sure you stay up to date on your yearly vaccinations as told by your health care provider. This may include vaccines for the flu and pneumonia.  Avoid outdoor activities when allergen counts are high and when air quality is low.  Wear a ski mask that covers your nose and mouth during outdoor winter activities. Exercise indoors on cold  days if you can.  Warm up before exercising, and take time for a cool-down period after exercise.  Keep all follow-up visits as told by your health care provider. This is important. Where to find more information  For information about asthma, turn to the Centers for Disease Control and Prevention at http://www.clark.net/  For air quality information, turn to [employer] at https://www.[place]ains-reyes.info/ Contact a health care provider if:  You have wheezing, shortness of breath, or a cough even while you are taking medicine to prevent attacks.  The mucus you cough up (sputum) is thicker than usual.  Your sputum changes from clear or white to yellow,  green, gray, or bloody.  Your medicines are causing side effects, such as a rash, itching, swelling, or trouble breathing.  You need to use a reliever medicine more than 2-3 times a week.  Your peak flow reading is still at 50-79% of your personal best after following your action plan for 1 hour.  You have a fever. Get help right away if:  You are getting worse and do not respond to treatment during an asthma attack.  You are short of breath when at rest or when doing very little physical activity.  You have difficulty eating, drinking, or talking.  You have chest pain or tightness.  You develop a fast heartbeat or palpitations.  You have a bluish color to your lips or fingernails.  You are light-headed or dizzy, or you faint.  Your peak flow reading is less than 50% of your personal best.  You feel too tired to breathe normally. Summary  Asthma is a long-term (chronic) condition that causes recurrent episodes in which the airways become tight and narrow. These episodes can cause coughing, wheezing, shortness of breath, and chest pain.  Asthma cannot be cured, but medicines and lifestyle changes can help control it and treat acute attacks.  Make sure you understand how to avoid triggers and how and when to use your medicines.  Asthma attacks can range from minor to life threatening. Get help right away if you have an asthma attack and do not respond to treatment with your usual rescue medicines. This information is not intended to replace advice given to you by your health care provider. Make sure you discuss any questions you have with your health care provider. Document Revised: 04/23/2020 Document Reviewed: 11/26/2019 Elsevier Patient Education  2021 [employer].

## 2020-09-15 NOTE — Progress Notes (Signed)
Subjective:    Alexis Beck - 45 y.o. female MRN [id number]  Date of birth: Nov 18, 1975  HPI  Alexis Beck is to establish care. Patient has a PMH significant for asthma, acute lower UTI, syncope and collapse, depression, and anxiety.    Current issues and/or concerns: 1. ASTHMA: 06/17/2020: Visit with physician assistant Freeman Caldron. Will resume maintenance/control inhaler so that hopefully she can decrease rescue inhaler usage to < 2times/per week.  Albuterol inhaler and Fluticasone-Salmeterol inhaler prescribed.   09/15/2020:  Asthma status: uncontrolled, not using maintenance inhaler Advair often as she forgets to use this sometimes Albuterol/rescue inhaler frequency: 4 to 6 times daily, states it feels like she is not getting enough air  Dyspnea frequency: no Wheezing frequency: no  Cough frequency: no  Nocturnal symptom frequency: yes, but this baseline Current upper respiratory symptoms: no Triggers: temperature fluctuations   2. ANEMIA: 06/17/2020: Visit with physician assistant Freeman Caldron. Continued on iron once daily.  09/15/2020:  Compliance with treatment: good compliance Iron supplementation side effects: no Bleeding: no  3. TYROID CONCERNS: Reports in the past she was told thyroid was either too high or too low but cannot recall. Would like to have this rechecked.   4. FIBROIDS CONCERN: States fibroids for about 18 years or more. Would like referral to Gynecology.   5. VITILIGO: Reports she has vitiligo. Interested in possible additional testing for autoimmune disorders.   ROS per HPI    Health Maintenance:  Health Maintenance Due  Topic Date Due  . Hepatitis C Screening  Never done  . COVID-19 Vaccine (1) Never done  . TETANUS/TDAP  Never done  . PAP SMEAR-Modifier  Never done  . COLONOSCOPY (Pts 45-80yrs Insurance coverage will need to be confirmed)  Never done  . INFLUENZA VACCINE  Never done    Past Medical History: Patient Active Problem  List   Diagnosis Date Noted  . Acute lower UTI 05/31/2020  . UTI (urinary tract infection) 05/31/2020  . Iron deficiency anemia due to chronic blood loss 10/22/2017  . Mild intermittent asthma with exacerbation 10/22/2017  . Central cord syndrome ([place]) 10/19/2017  . 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 Cervical stenosis of spinal canal 10/19/2017  . Paresthesia and pain of both upper extremities 10/19/2017  . Syncope and collapse 10/18/2017  . 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 Asthma 03/08/2013  . Depression 02/12/2012  . Anxiety 02/12/2012    Social History   reports that she has never smoked. She has never used smokeless tobacco. She reports that she does not drink alcohol and does not use drugs.   Family History  family history includes Depression in her brother and maternal grandmother; Drug abuse in her father and mother; Schizophrenia in her cousin.   Medications: reviewed and updated   Objective:   Physical Exam BP (!) 141/87 (BP Location: Left Arm, Patient Position: Sitting)   Pulse 87   Wt 204 lb 12.8 oz (92.9 kg)   SpO2 98%   BMI 34.08 kg/m    Physical Exam HENT:     Head: Normocephalic and atraumatic.  Eyes:     Extraocular Movements: Extraocular movements intact.     Pupils: Pupils are equal, round, and reactive to light.  Cardiovascular:     Rate and Rhythm: Normal rate and regular rhythm.     Pulses: Normal pulses.     Heart sounds: Normal heart sounds.  Pulmonary:     Effort: Pulmonary effort is normal.     Breath sounds: Normal breath sounds.  Musculoskeletal:     Cervical back: Normal range of motion and neck supple.  Neurological:     General: No focal deficit present.     Mental Status: She is alert and oriented to person, place, and time.  Psychiatric:        Mood and Affect: Mood normal.        Behavior: Behavior normal.      Assessment & Plan:  1. Encounter to establish care: - Patient presents today to establish care.  - Return for annual  physical examination, labs, and health maintenance. Arrive fasting meaning having had no food and/or nothing to drink for at least 8 hours prior to appointment.  2. Moderate persistent asthma, unspecified whether complicated: - Uncontrolled.  - Currently using Albuterol rescue inhaler 4 to 6 times daily. Not using Advair maintenance inhaler stating she tends to forget to use this.  - Continue Albuterol asthma inhaler as prescribed.  - Begin using Advair maintenance inhaler as prescribed. Refills available on file. - Follow-up with primary provider as needed.  - albuterol (VENTOLIN HFA) 108 (90 Base) MCG/ACT inhaler; Inhale 1-2 puffs into the lungs every 6 (six) hours as needed for wheezing or shortness of breath.  Dispense: 18 g; Refill: 1  3. Anemia, unspecified type: - Continue Ferrous Sulfate as prescribed.  - ferrous sulfate 325 (65 FE) MG tablet; Take 1 tablet (325 mg total) by mouth daily.  Dispense: 120 tablet; Refill: 0  4. Uterine leiomyoma, unspecified location: - States fibroids for about 18 years or more. Would like referral to Gynecology.  - Per patient request referral to Gynecology for further evaluation and management.  - Ambulatory referral to Obstetrics / Gynecology  5. Vitiligo: - Patient has vitiligo. Interested in possible additional autoimmune disorders testing.  - Follow-up with primary provider as needed.   Durene Fruits, NP 09/19/2020, 8:24 AM [facility]

## 2020-09-19 MED ORDER — ALBUTEROL SULFATE HFA 108 (90 BASE) MCG/ACT IN AERS: 1.0000 | INHALATION_SPRAY | Freq: Four times a day (QID) | RESPIRATORY_TRACT | 1 refills | Status: DC | PRN

## 2020-09-19 MED ORDER — FERROUS SULFATE 325 (65 FE) MG PO TABS: 325.0000 mg | ORAL_TABLET | Freq: Every day | ORAL | 0 refills | Status: DC

## 2020-10-06 ENCOUNTER — Other Ambulatory Visit: Payer: Self-pay

## 2020-10-06 ENCOUNTER — Encounter: Payer: Self-pay | Admitting: Family

## 2020-10-06 DIAGNOSIS — J4540 Moderate persistent asthma, uncomplicated: Secondary | ICD-10-CM

## 2020-10-06 MED ORDER — ALBUTEROL SULFATE HFA 108 (90 BASE) MCG/ACT IN AERS: 1.0000 | INHALATION_SPRAY | Freq: Four times a day (QID) | RESPIRATORY_TRACT | 1 refills | Status: DC | PRN

## 2020-10-06 NOTE — Progress Notes (Signed)
Albuterol inhaler reordered

## 2020-10-20 ENCOUNTER — Other Ambulatory Visit: Payer: Self-pay

## 2020-10-20 ENCOUNTER — Ambulatory Visit (INDEPENDENT_AMBULATORY_CARE_PROVIDER_SITE_OTHER): Payer: Self-pay | Admitting: Family

## 2020-10-20 ENCOUNTER — Other Ambulatory Visit: Payer: Self-pay | Admitting: Family

## 2020-10-20 VITALS — BP 140/84 | HR 80 | Ht 65.0 in | Wt 206.8 lb

## 2020-10-20 DIAGNOSIS — Z1159 Encounter for screening for other viral diseases: Secondary | ICD-10-CM

## 2020-10-20 DIAGNOSIS — Z13220 Encounter for screening for lipoid disorders: Secondary | ICD-10-CM

## 2020-10-20 DIAGNOSIS — J4540 Moderate persistent asthma, uncomplicated: Secondary | ICD-10-CM

## 2020-10-20 DIAGNOSIS — Z131 Encounter for screening for diabetes mellitus: Secondary | ICD-10-CM

## 2020-10-20 DIAGNOSIS — D500 Iron deficiency anemia secondary to blood loss (chronic): Secondary | ICD-10-CM

## 2020-10-20 DIAGNOSIS — Z1231 Encounter for screening mammogram for malignant neoplasm of breast: Secondary | ICD-10-CM

## 2020-10-20 DIAGNOSIS — Z0000 Encounter for general adult medical examination without abnormal findings: Secondary | ICD-10-CM

## 2020-10-20 DIAGNOSIS — Z1329 Encounter for screening for other suspected endocrine disorder: Secondary | ICD-10-CM

## 2020-10-20 DIAGNOSIS — Z1211 Encounter for screening for malignant neoplasm of colon: Secondary | ICD-10-CM

## 2020-10-20 DIAGNOSIS — Z124 Encounter for screening for malignant neoplasm of cervix: Secondary | ICD-10-CM

## 2020-10-20 DIAGNOSIS — Z13228 Encounter for screening for other metabolic disorders: Secondary | ICD-10-CM

## 2020-10-20 MED ORDER — ALBUTEROL SULFATE HFA 108 (90 BASE) MCG/ACT IN AERS: 1.0000 | INHALATION_SPRAY | Freq: Four times a day (QID) | RESPIRATORY_TRACT | 2 refills | Status: DC | PRN

## 2020-10-20 NOTE — Progress Notes (Signed)
Patient ID: Alexis Beck, female    DOB: 03-Feb-1976  MRN: [id number]  CC: Annual Physical Exam  Subjective: Alexis Beck is a 45 y.o. female who presents for annual physical exam. Her concerns today include:   1. ASTHMA FOLLOW-UP: 09/15/2020:  - Uncontrolled.  - Currently using Albuterol rescue inhaler 4 to 6 times daily. Not using Advair maintenance inhaler stating she tends to forget to use this.  - Continue Albuterol asthma inhaler as prescribed.  - Begin using Advair maintenance inhaler as prescribed. Refills available on file.  10/20/2020: Asthma status: stable Satisfied with current treatment?: yes and requesting refills on Albuterol Albuterol/rescue inhaler frequency: Reports still using inhaler about 4 or 5 times daily. Not using Advair inhaler.  Patient Active Problem List   Diagnosis Date Noted   Acute lower UTI 05/31/2020   UTI (urinary tract infection) 05/31/2020   Iron deficiency anemia due to chronic blood loss 10/22/2017   Mild intermittent asthma with exacerbation 10/22/2017   Central cord syndrome ([place]) 10/19/2017   Cervical disc herniation 10/19/2017   Cervical stenosis of spinal canal 10/19/2017   Paresthesia and pain of both upper extremities 10/19/2017   Syncope and collapse 10/18/2017   Anaphylaxis 03/08/2013   Asthma 03/08/2013   Depression 02/12/2012   Anxiety 02/12/2012     Current Outpatient Medications on File Prior to Visit  Medication Sig Dispense Refill   albuterol (VENTOLIN HFA) 108 (90 Base) MCG/ACT inhaler Inhale 1-2 puffs into the lungs every 6 (six) hours as needed for wheezing or shortness of breath. 18 g 1   ferrous sulfate 325 (65 FE) MG tablet Take 1 tablet (325 mg total) by mouth daily. 120 tablet 0   Fluticasone-Salmeterol (ADVAIR DISKUS) 250-50 MCG/DOSE AEPB Inhale 1 puff into the lungs 2 (two) times daily. 1 each 3   [DISCONTINUED] cetirizine (ZYRTEC) 10 MG tablet Take 1 tablet (10 mg total) by mouth daily.  (Patient not taking: Reported on 11/02/2019) 30 tablet 0   [DISCONTINUED] fluticasone (FLONASE) 50 MCG/ACT nasal spray Place 2 sprays into both nostrils daily. (Patient not taking: Reported on 11/02/2019) 16 g 0   No current facility-administered medications on file prior to visit.    Allergies  Allergen Reactions   Shellfish Allergy Anaphylaxis and Hives    Social History   Socioeconomic History   Marital status: Married    Spouse name: Not on file   Number of children: Not on file   Years of education: Not on file   Highest education level: Not on file  Occupational History   Not on file  Tobacco Use   Smoking status: Never Smoker   Smokeless tobacco: Never Used  Vaping Use   Vaping Use: Never used  Substance and Sexual Activity   Alcohol use: No   Drug use: No   Sexual activity: Yes    Birth control/protection: Surgical  Other Topics Concern   Not on file  Social History Narrative   Not on file   Social Determinants of Health   Financial Resource Strain: Not on file  Food Insecurity: Not on file  Transportation Needs: Not on file  Physical Activity: Not on file  Stress: Not on file  Social Connections: Not on file  Intimate Partner Violence: Not on file    Family History  Problem Relation Age of Onset   Drug abuse Mother    Drug abuse Father    Depression Brother    Depression Maternal Grandmother    Schizophrenia Cousin  Past Surgical History:  Procedure Laterality Date   NECK SURGERY  10/2017   TUBAL LIGATION  2003    ROS: Review of Systems Negative except as stated above  PHYSICAL EXAM: BP 140/84 (BP Location: Left Arm, Patient Position: Sitting)    Pulse 80    Ht 5\' 5"  (1.651 m)    Wt 206 lb 12.8 oz (93.8 kg)    SpO2 98%    BMI 34.41 kg/m   Wt Readings from Last 3 Encounters:  10/20/20 206 lb 12.8 oz (93.8 kg)  09/15/20 204 lb 12.8 oz (92.9 kg)  06/17/20 202 lb 6.4 oz (91.8 kg)    Physical Exam Exam conducted  with a chaperone present.  HENT:     Head: Normocephalic and atraumatic.     Right Ear: Tympanic membrane, ear canal and external ear normal.     Left Ear: Tympanic membrane, ear canal and external ear normal.     Nose: Nose normal.     Mouth/Throat:     Mouth: Mucous membranes are moist.     Pharynx: Oropharynx is clear.  Eyes:     Extraocular Movements: Extraocular movements intact.     Conjunctiva/sclera: Conjunctivae normal.     Pupils: Pupils are equal, round, and reactive to light.  Cardiovascular:     Rate and Rhythm: Normal rate and regular rhythm.     Pulses: Normal pulses.     Heart sounds: Normal heart sounds.  Pulmonary:     Effort: Pulmonary effort is normal.     Breath sounds: Normal breath sounds.  Chest:  Breasts:     Right: Normal.     Left: Normal.      Comments: Elmon Else, CMA present during examination.  Abdominal:     General: Bowel sounds are normal.     Palpations: Abdomen is soft.  Genitourinary:    Comments: Patient declined examination. Musculoskeletal:        General: Normal range of motion.     Cervical back: Normal range of motion and neck supple.  Skin:    General: Skin is warm and dry.     Capillary Refill: Capillary refill takes less than 2 seconds.  Neurological:     General: No focal deficit present.     Mental Status: She is alert and oriented to person, place, and time.  Psychiatric:        Mood and Affect: Mood normal.        Behavior: Behavior normal.     ASSESSMENT AND PLAN: 1. Annual physical exam: - Counseled on 150 minutes of exercise per week as tolerated, healthy eating (including decreased daily intake of saturated fats, cholesterol, added sugars, sodium), STI prevention, and routine healthcare maintenance.  2. Screening for metabolic disorder:  - CMP to check kidney function, liver function, and electrolyte balance.  - Comprehensive metabolic panel; Future  3. Diabetes mellitus screening: - Hemoglobin A1c to  screen for pre-diabetes/diabetes. - Hemoglobin A1c; Future  4. Screening cholesterol level: - Lipid panel to screen for high cholesterol.  - Lipid panel; Future  5. Thyroid disorder screen: - TSH to check thyroid function.  - TSH+T4F+T3Free; Future  6. Need for hepatitis C screening test: - HCV antibody to screen for hepatitis C.  - HCV Ab w/Rflx to Verification; Future  7. Cervical cancer screening: - Referral to Gynecology for cervical cancer screening by PAP smear. - Ambulatory referral to Gynecology  8. Encounter for screening mammogram for malignant neoplasm of breast: - Referral for  breast cancer screening by mammogram. - MM Digital Screening; Future  9. Colon cancer screening: - Referral to Gastroenterology for colon cancer screening by colonoscopy. - Ambulatory referral to Gastroenterology  10. Iron deficiency anemia due to chronic blood loss: - CBC and iron panel to screen for anemia. - CBC; Future - Iron, TIBC and Ferritin Panel; Future  11. Moderate persistent asthma, unspecified whether complicated: - Stable.  - Continue Albuterol inhaler as prescribed.  - Counseled to not overuse Albuterol rescue inhaler and that if she is using it more than the recommended 4 times daily then we will need to consider adding Advair inhaler back to regimen. Patient agreeable.  - albuterol (VENTOLIN HFA) 108 (90 Base) MCG/ACT inhaler; Inhale 1-2 puffs into the lungs every 6 (six) hours as needed for wheezing or shortness of breath.  Dispense: 18 g; Refill: 2    Patient was given the opportunity to ask questions.  Patient verbalized understanding of the plan and was able to repeat key elements of the plan. Patient was given clear instructions to go to Emergency Department or return to medical center if symptoms don't improve, worsen, or new problems develop.The patient verbalized understanding.   Orders Placed This Encounter  Procedures   HCV Ab w/Rflx to Verification    Comprehensive metabolic panel   CBC   Lipid panel   TSH+T4F+T3Free   Iron, TIBC and Ferritin Panel   Hemoglobin A1c   Ambulatory referral to Gastroenterology   Ambulatory referral to Gynecology     Requested Prescriptions   Pending Prescriptions Disp Refills   albuterol (VENTOLIN HFA) 108 (90 Base) MCG/ACT inhaler 18 g 1    Sig: Inhale 1-2 puffs into the lungs every 6 (six) hours as needed for wheezing or shortness of breath.    No follow-ups on file.  Camillia Herter, NP

## 2020-10-20 NOTE — Patient Instructions (Addendum)
Annual physical exam and labs today.   Continue Albuterol for asthma. Follow-up as needed.   Referral to Gastroenterology for colonoscopy.  Referral to Gynecology for PAP smear.   Follow-up with primary provider as scheduled.   Preventive Care 39-46 Years Old, Female Preventive care refers to lifestyle choices and visits with your health care provider that can promote health and wellness. This includes:  A yearly physical exam. This is also called an annual wellness visit.  Regular dental and eye exams.  Immunizations.  Screening for certain conditions.  Healthy lifestyle choices, such as: ? Eating a healthy diet. ? Getting regular exercise. ? Not using drugs or products that contain nicotine and tobacco. ? Limiting alcohol use. What can I expect for my preventive care visit? Physical exam Your health care provider will check your:  Height and weight. These may be used to calculate your BMI (body mass index). BMI is a measurement that tells if you are at a healthy weight.  Heart rate and blood pressure.  Body temperature.  Skin for abnormal spots. Counseling Your health care provider may ask you questions about your:  Past medical problems.  Family's medical history.  Alcohol, tobacco, and drug use.  Emotional well-being.  Home life and relationship well-being.  Sexual activity.  Diet, exercise, and sleep habits.  Work and work Statistician.  Access to firearms.  Method of birth control.  Menstrual cycle.  Pregnancy history. What immunizations do I need? Vaccines are usually given at various ages, according to a schedule. Your health care provider will recommend vaccines for you based on your age, medical history, and lifestyle or other factors, such as travel or where you work.   What tests do I need? Blood tests  Lipid and cholesterol levels. These may be checked every 5 years, or more often if you are over 46 years old.  Hepatitis C  test.  Hepatitis B test. Screening  Lung cancer screening. You may have this screening every year starting at age 73 if you have a 30-pack-year history of smoking and currently smoke or have quit within the past 15 years.  Colorectal cancer screening. ? All adults should have this screening starting at age 3 and continuing until age 84. ? Your health care provider may recommend screening at age 75 if you are at increased risk. ? You will have tests every 1-10 years, depending on your results and the type of screening test.  Diabetes screening. ? This is done by checking your blood sugar (glucose) after you have not eaten for a while (fasting). ? You may have this done every 1-3 years.  Mammogram. ? This may be done every 1-2 years. ? Talk with your health care provider about when you should start having regular mammograms. This may depend on whether you have a family history of breast cancer.  BRCA-related cancer screening. This may be done if you have a family history of breast, ovarian, tubal, or peritoneal cancers.  Pelvic exam and Pap test. ? This may be done every 3 years starting at age 89. ? Starting at age 75, this may be done every 5 years if you have a Pap test in combination with an HPV test. Other tests  STD (sexually transmitted disease) testing, if you are at risk.  Bone density scan. This is done to screen for osteoporosis. You may have this scan if you are at high risk for osteoporosis. Talk with your health care provider about your test results, treatment options,  and if necessary, the need for more tests. Follow these instructions at home: Eating and drinking  Eat a diet that includes fresh fruits and vegetables, whole grains, lean protein, and low-fat dairy products.  Take vitamin and mineral supplements as recommended by your health care provider.  Do not drink alcohol if: ? Your health care provider tells you not to drink. ? You are pregnant, may be  pregnant, or are planning to become pregnant.  If you drink alcohol: ? Limit how much you have to 0-1 drink a day. ? Be aware of how much alcohol is in your drink. In the [place] one drink equals one 12 oz bottle of beer (355 mL), one 5 oz glass of wine (148 mL), or one 1 oz glass of hard liquor (44 mL).   Lifestyle  Take daily care of your teeth and gums. Brush your teeth every morning and night with fluoride toothpaste. Floss one time each day.  Stay active. Exercise for at least 30 minutes 5 or more days each week.  Do not use any products that contain nicotine or tobacco, such as cigarettes, e-cigarettes, and chewing tobacco. If you need help quitting, ask your health care provider.  Do not use drugs.  If you are sexually active, practice safe sex. Use a condom or other form of protection to prevent STIs (sexually transmitted infections).  If you do not wish to become pregnant, use a form of birth control. If you plan to become pregnant, see your health care provider for a prepregnancy visit.  If told by your health care provider, take low-dose aspirin daily starting at age 72.  Find healthy ways to cope with stress, such as: ? Meditation, yoga, or listening to music. ? Journaling. ? Talking to a trusted person. ? Spending time with friends and family. Safety  Always wear your seat belt while driving or riding in a vehicle.  Do not drive: ? If you have been drinking alcohol. Do not ride with someone who has been drinking. ? When you are tired or distracted. ? While texting.  Wear a helmet and other protective equipment during sports activities.  If you have firearms in your house, make sure you follow all gun safety procedures. What's next?  Visit your health care provider once a year for an annual wellness visit.  Ask your health care provider how often you should have your eyes and teeth checked.  Stay up to date on all vaccines. This information is not intended to  replace advice given to you by your health care provider. Make sure you discuss any questions you have with your health care provider. Document Revised: 04/27/2020 Document Reviewed: 04/04/2018 Elsevier Patient Education  2021 [employer].

## 2020-10-20 NOTE — Progress Notes (Signed)
Physical

## 2020-10-22 ENCOUNTER — Other Ambulatory Visit: Payer: Self-pay

## 2020-10-27 ENCOUNTER — Other Ambulatory Visit: Payer: Self-pay

## 2020-10-27 ENCOUNTER — Ambulatory Visit (INDEPENDENT_AMBULATORY_CARE_PROVIDER_SITE_OTHER): Payer: Self-pay | Admitting: Obstetrics & Gynecology

## 2020-10-27 ENCOUNTER — Encounter: Payer: Self-pay | Admitting: Obstetrics & Gynecology

## 2020-10-27 VITALS — BP 123/71 | HR 79 | Wt 204.9 lb

## 2020-10-27 DIAGNOSIS — Z86018 Personal history of other benign neoplasm: Secondary | ICD-10-CM

## 2020-10-27 DIAGNOSIS — N920 Excessive and frequent menstruation with regular cycle: Secondary | ICD-10-CM

## 2020-10-27 MED ORDER — TRANEXAMIC ACID 650 MG PO TABS: 1300.0000 mg | ORAL_TABLET | Freq: Three times a day (TID) | ORAL | 2 refills | Status: DC

## 2020-10-27 MED ORDER — NAPROXEN 500 MG PO TABS: 500.0000 mg | ORAL_TABLET | Freq: Two times a day (BID) | ORAL | 2 refills | Status: DC

## 2020-10-27 NOTE — Patient Instructions (Signed)
BCCCP (Breast and Cervical Cancer Control Program)  [phone]   

## 2020-10-27 NOTE — Progress Notes (Signed)
GYNECOLOGY OFFICE VISIT NOTE  History:   Alexis Beck is a 45 y.o. [phone] here today for discussion about fibroids.  Reports long history of occasional heavy menstrual periods after tubal ligation in 2003, and fibroids were recently mentioned on CT scan done for renal indications.  No pain or pressure, heavy menstrual bleeding occurs sporadically.  Wants to see if there is anything she can take to help with periods as needed. No presyncopal symptoms.  She denies any current abnormal vaginal discharge, bleeding, pelvic pain or other concerns.    Past Medical History:  Diagnosis Date  . Anxiety   . Asthma   . Bipolar disorder ([place])   . Depression   . Fibroids    Diagnosed around age 62    Past Surgical History:  Procedure Laterality Date  . NECK SURGERY  10/2017  . TUBAL LIGATION  2003    The following portions of the patient's history were reviewed and updated as appropriate: allergies, current medications, past family history, past medical history, past social history, past surgical history and problem list.   Health Maintenance:  Normal pap and mammogram about 3 years ago.  Review of Systems:  Pertinent items noted in HPI and remainder of comprehensive ROS otherwise negative.  Physical Exam:  BP 123/71   Pulse 79   Wt 204 lb 14.4 oz (92.9 kg)   LMP 10/24/2020 (Exact Date)   BMI 34.10 kg/m  CONSTITUTIONAL: Well-developed, well-nourished female in no acute distress.  HEENT:  Normocephalic, atraumatic. External right and left ear normal. No scleral icterus.  NECK: Normal range of motion, supple, no masses noted on observation SKIN: No rash noted. Not diaphoretic. No erythema. No pallor. MUSCULOSKELETAL: Normal range of motion. No edema noted. NEUROLOGIC: Alert and oriented to person, place, and time. Normal muscle tone coordination. No cranial nerve deficit noted. PSYCHIATRIC: Normal mood and affect. Normal behavior. Normal judgment and thought content. CARDIOVASCULAR:  Normal heart rate noted RESPIRATORY: Effort and breath sounds normal, no problems with respiration noted ABDOMEN: No masses noted. No other overt distention noted.   PELVIC: Deferred     Assessment and Plan:     1. History of uterine fibroid 2. Menorrhagia with regular cycle Recommended ultrasound for further characterization of her fibroids. will follow up results and manage accordingly. Discussed management options for abnormal uterine bleeding including NSAIDs (Naproxen), tranexamic acid (Lysteda), oral progesterone, Depo Provera, Levonogestrel IUD, endometrial ablation or hysterectomy as definitive surgical management.  Discussed risks and benefits of each method.   Patient desires Lysteda and Naproxen for now, these were prescribed. Bleeding precautions reviewed.  - US PELVIC COMPLETE WITH TRANSVAGINAL; Future - tranexamic acid (LYSTEDA) 650 MG TABS tablet; Take 2 tablets (1,300 mg total) by mouth 3 (three) times daily. Take during menses for a maximum of five days  Dispense: 30 tablet; Refill: 2 - naproxen (NAPROSYN) 500 MG tablet; Take 1 tablet (500 mg total) by mouth 2 (two) times daily with a meal. As needed for pain  Dispense: 60 tablet; Refill: 2 Routine preventative health maintenance measures emphasized, referred to [facility] for pap smear and mammogram. Please refer to After Visit Summary for other counseling recommendations.   Return in about 1 month (around 11/27/2020) for Followup.    I spent 25 minutes dedicated to the care of this patient including pre-visit review of records, face to face time with the patient discussing her conditions and treatments and post visit ordering of testing.    Verita Schneiders, MD, [place]  Gynecologist, Product/process development scientist for [employer], [place]

## 2020-10-28 ENCOUNTER — Emergency Department (HOSPITAL_COMMUNITY): Admission: EM | Admit: 2020-10-28 | Discharge: 2020-10-28 | Discharge status: Against Medical Advice | Payer: Self-pay

## 2020-10-28 ENCOUNTER — Other Ambulatory Visit: Payer: Self-pay | Admitting: Family Medicine

## 2020-10-28 DIAGNOSIS — J4540 Moderate persistent asthma, uncomplicated: Secondary | ICD-10-CM

## 2020-10-28 MED FILL — FLUTICASONE-SALMETEROL 250-: 250-50 | 30 days supply | Qty: 60 | Fill #1

## 2020-11-03 ENCOUNTER — Ambulatory Visit: Payer: Self-pay | Attending: Family

## 2020-11-03 ENCOUNTER — Other Ambulatory Visit: Payer: Self-pay | Admitting: Family Medicine

## 2020-11-03 ENCOUNTER — Other Ambulatory Visit: Payer: Self-pay

## 2020-11-03 DIAGNOSIS — D500 Iron deficiency anemia secondary to blood loss (chronic): Secondary | ICD-10-CM

## 2020-11-03 DIAGNOSIS — Z1159 Encounter for screening for other viral diseases: Secondary | ICD-10-CM

## 2020-11-03 DIAGNOSIS — J4540 Moderate persistent asthma, uncomplicated: Secondary | ICD-10-CM

## 2020-11-03 DIAGNOSIS — Z131 Encounter for screening for diabetes mellitus: Secondary | ICD-10-CM

## 2020-11-03 DIAGNOSIS — Z13220 Encounter for screening for lipoid disorders: Secondary | ICD-10-CM

## 2020-11-03 DIAGNOSIS — Z13228 Encounter for screening for other metabolic disorders: Secondary | ICD-10-CM

## 2020-11-03 DIAGNOSIS — Z1329 Encounter for screening for other suspected endocrine disorder: Secondary | ICD-10-CM

## 2020-11-03 NOTE — Telephone Encounter (Signed)
Albuterol inhaler ordered 10/20/2020 with 2 refills.   Please confirm with patient if she has used all three inhalers available to her since 14 days ago.   If she has we will need to schedule her for an office visit for uncontrolled symptoms.  Also, confirm if she is using the Advair inhaler as previously prescribed as this was recommended on her recent visit on 09/15/2020.

## 2020-11-04 LAB — LIPID PANEL
Chol/HDL Ratio: 3.9 ratio (ref 0.0–4.4)
Cholesterol, Total: 225 mg/dL — ABNORMAL HIGH (ref 100–199)
HDL: 57 mg/dL (ref >39)
LDL Chol Calc (NIH): 146 mg/dL — ABNORMAL HIGH (ref 0–99)
Triglycerides: 125 mg/dL (ref 0–149)
VLDL Cholesterol Cal: 22 mg/dL (ref 5–40)

## 2020-11-04 LAB — HCV AB W/RFLX TO VERIFICATION: HCV Ab: <0.1 s/co ratio (ref 0.0–0.9)

## 2020-11-04 LAB — CBC
Hematocrit: 35.7 % (ref 34.0–46.6)
Hemoglobin: 11 g/dL — ABNORMAL LOW (ref 11.1–15.9)
MCH: 23.4 pg — ABNORMAL LOW (ref 26.6–33.0)
MCHC: 30.8 g/dL — ABNORMAL LOW (ref 31.5–35.7)
MCV: 76 fL — ABNORMAL LOW (ref 79–97)
Platelets: 268 10*3/uL (ref 150–450)
RBC: 4.7 x10E6/uL (ref 3.77–5.28)
RDW: 18.4 % — ABNORMAL HIGH (ref 11.7–15.4)
WBC: 4.6 10*3/uL (ref 3.4–10.8)

## 2020-11-04 LAB — COMPREHENSIVE METABOLIC PANEL
ALT: 15 IU/L (ref 0–32)
AST: 21 IU/L (ref 0–40)
Albumin/Globulin Ratio: 1.6 (ref 1.2–2.2)
Albumin: 4.4 g/dL (ref 3.8–4.8)
Alkaline Phosphatase: 50 IU/L (ref 44–121)
BUN/Creatinine Ratio: 9 (ref 9–23)
BUN: 8 mg/dL (ref 6–24)
Bilirubin Total: 0.4 mg/dL (ref 0.0–1.2)
CO2: 20 mmol/L (ref 20–29)
Calcium: 9.1 mg/dL (ref 8.7–10.2)
Chloride: 101 mmol/L (ref 96–106)
Creatinine, Ser: 0.86 mg/dL (ref 0.57–1.00)
Globulin, Total: 2.7 g/dL (ref 1.5–4.5)
Glucose: 103 mg/dL — ABNORMAL HIGH (ref 65–99)
Potassium: 4.5 mmol/L (ref 3.5–5.2)
Sodium: 139 mmol/L (ref 134–144)
Total Protein: 7.1 g/dL (ref 6.0–8.5)
eGFR: 84 mL/min/{1.73_m2} (ref >59)

## 2020-11-04 LAB — TSH+T4F+T3FREE
Free T4: 0.64 ng/dL — ABNORMAL LOW (ref 0.82–1.77)
T3, Free: 2.8 pg/mL (ref 2.0–4.4)
TSH: 3.42 u[IU]/mL (ref 0.450–4.500)

## 2020-11-04 LAB — IRON,TIBC AND FERRITIN PANEL
Ferritin: 9 ng/mL — ABNORMAL LOW (ref 15–150)
Iron Saturation: 9 % — CL (ref 15–55)
Iron: 32 ug/dL (ref 27–159)
Total Iron Binding Capacity: 368 ug/dL (ref 250–450)
UIBC: 336 ug/dL (ref 131–425)

## 2020-11-04 LAB — HEMOGLOBIN A1C
Est. average glucose Bld gHb Est-mCnc: 137 mg/dL
Hgb A1c MFr Bld: 6.4 % — ABNORMAL HIGH (ref 4.8–5.6)

## 2020-11-04 LAB — HCV INTERPRETATION

## 2020-11-04 NOTE — Telephone Encounter (Signed)
Att to contact pt to verify use of Advair inhaler, no ans lvm

## 2020-11-05 MED ORDER — SODIUM CHLORIDE 0.9 % IV SOLN: 510.0000 mg | Freq: Once | INTRAVENOUS | 1 refills | Status: DC

## 2020-11-05 NOTE — Progress Notes (Signed)
Kidney function normal.   Liver function normal.   Hepatitis C negative.   Hemoglobin A1c is consistent with pre-diabetes. Practice healthy eating habits of fresh fruit and vegetables, lean baked meats such as chicken, fish, and [place]; limit breads, rice, pastas, and desserts; practice regular aerobic exercise (at least 150 minutes a week as tolerated). Patient encouraged to have rechecked in 6 months or sooner if needed.   Cholesterol higher than expected. High cholesterol may increase risk of heart attack and/or stroke. Consider eating more fruits, vegetables, and lean baked meats such as chicken or fish. Moderate intensity exercise at least 150 minutes as tolerated per week may help as well.   There is a minor variation in thyroid lab. Patient encouraged to recheck within 3 to 6 months or sooner if needed.  Iron is low and will require two iron infusions. The nurse will call with appointment details. Patient encouraged to have iron rechecked 1 month after completion of second iron infusion.  The following is for provider reference only: The 10-year ASCVD risk score Mikey Bussing DC Brooke Bonito., et al., 2013) is: 1.1%   Values used to calculate the score:     Age: 45 years     Sex: Female     Is Non-Hispanic African American: Yes     Diabetic: No     Tobacco smoker: No     Systolic Blood Pressure: 092 mmHg     Is BP treated: No     HDL Cholesterol: 57 mg/dL     Total Cholesterol: 225 mg/dL

## 2020-11-05 NOTE — Addendum Note (Signed)
Addended by: Camillia Herter on: 11/05/2020 07:39 AM   Modules accepted: Orders

## 2020-11-08 ENCOUNTER — Ambulatory Visit
Admission: RE | Admit: 2020-11-08 | Discharge: 2020-11-08 | Disposition: A | Discharge status: Home / Self Care | Payer: Self-pay | Source: Ambulatory Visit | Attending: Obstetrics & Gynecology | Admitting: Obstetrics & Gynecology

## 2020-11-08 ENCOUNTER — Other Ambulatory Visit: Payer: Self-pay

## 2020-11-08 DIAGNOSIS — Z86018 Personal history of other benign neoplasm: Secondary | ICD-10-CM | POA: Insufficient documentation

## 2020-11-08 DIAGNOSIS — D500 Iron deficiency anemia secondary to blood loss (chronic): Secondary | ICD-10-CM

## 2020-11-08 DIAGNOSIS — N920 Excessive and frequent menstruation with regular cycle: Secondary | ICD-10-CM | POA: Insufficient documentation

## 2020-11-08 MED ORDER — SODIUM CHLORIDE 0.9 % IV SOLN: 510.0000 mg | Freq: Once | INTRAVENOUS | 0 refills | Status: AC

## 2020-11-10 ENCOUNTER — Non-Acute Institutional Stay (HOSPITAL_COMMUNITY)
Admission: RE | Admit: 2020-11-10 | Discharge: 2020-11-10 | Disposition: A | Discharge status: Home / Self Care | Payer: Self-pay | Source: Ambulatory Visit | Attending: Internal Medicine | Admitting: Internal Medicine

## 2020-11-10 ENCOUNTER — Other Ambulatory Visit: Payer: Self-pay

## 2020-11-10 DIAGNOSIS — D509 Iron deficiency anemia, unspecified: Secondary | ICD-10-CM | POA: Insufficient documentation

## 2020-11-10 MED ORDER — SODIUM CHLORIDE 0.9 % IV SOLN
510.0000 mg | Freq: Once | INTRAVENOUS | Status: AC
  Administered 2020-11-10: 510 mg via INTRAVENOUS
  Filled 2020-11-10: qty 510

## 2020-11-10 MED ORDER — SODIUM CHLORIDE 0.9 % IV SOLN
INTRAVENOUS | Status: DC | PRN
  Administered 2020-11-10: 250 mL via INTRAVENOUS

## 2020-11-10 NOTE — Progress Notes (Signed)
Patient received IV Feraheme as ordered by Durene Fruits NP Observed for at least 30 minutes post infusion.Tolerated well, vitals stable, discharge instructions given, verbalized understanding. Alert, oriented and ambulatory at the time of discharge.

## 2020-11-10 NOTE — Discharge Instructions (Signed)
Ferumoxytol injection What is this medicine? FERUMOXYTOL is an iron complex. Iron is used to make healthy red blood cells, which carry oxygen and nutrients throughout the body. This medicine is used to treat iron deficiency anemia. This medicine may be used for other purposes; ask your health care provider or pharmacist if you have questions. COMMON BRAND NAME(S): Feraheme What should I tell my health care provider before I take this medicine? They need to know if you have any of these conditions:  anemia not caused by low iron levels  high levels of iron in the blood  magnetic resonance imaging (MRI) test scheduled  an unusual or allergic reaction to iron, other medicines, foods, dyes, or preservatives  pregnant or trying to get pregnant  breast-feeding How should I use this medicine? This medicine is for injection into a vein. It is given by a health care professional in a hospital or clinic setting. Talk to your pediatrician regarding the use of this medicine in children. Special care may be needed. Overdosage: If you think you have taken too much of this medicine contact a poison control center or emergency room at once. NOTE: This medicine is only for you. Do not share this medicine with others. What if I miss a dose? It is important not to miss your dose. Call your doctor or health care professional if you are unable to keep an appointment. What may interact with this medicine? This medicine may interact with the following medications:  other iron products This list may not describe all possible interactions. Give your health care provider a list of all the medicines, herbs, non-prescription drugs, or dietary supplements you use. Also tell them if you smoke, drink alcohol, or use illegal drugs. Some items may interact with your medicine. What should I watch for while using this medicine? Visit your doctor or healthcare professional regularly. Tell your doctor or healthcare  professional if your symptoms do not start to get better or if they get worse. You may need blood work done while you are taking this medicine. You may need to follow a special diet. Talk to your doctor. Foods that contain iron include: whole grains/cereals, dried fruits, beans, or peas, leafy green vegetables, and organ meats (liver, kidney). What side effects may I notice from receiving this medicine? Side effects that you should report to your doctor or health care professional as soon as possible:  allergic reactions like skin rash, itching or hives, swelling of the face, lips, or tongue  breathing problems  changes in blood pressure  feeling faint or lightheaded, falls  fever or chills  flushing, sweating, or hot feelings  swelling of the ankles or feet Side effects that usually do not require medical attention (report to your doctor or health care professional if they continue or are bothersome):  diarrhea  headache  nausea, vomiting  stomach pain This list may not describe all possible side effects. Call your doctor for medical advice about side effects. You may report side effects to FDA at 1-800-FDA-1088. Where should I keep my medicine? This drug is given in a hospital or clinic and will not be stored at home. NOTE: This sheet is a summary. It may not cover all possible information. If you have questions about this medicine, talk to your doctor, pharmacist, or health care provider.  2021 Elsevier/Gold Standard (2016-09-11 20:21:10)  

## 2020-11-18 ENCOUNTER — Non-Acute Institutional Stay (HOSPITAL_COMMUNITY)
Admission: RE | Admit: 2020-11-18 | Discharge: 2020-11-18 | Disposition: A | Discharge status: Home / Self Care | Payer: Self-pay | Source: Ambulatory Visit | Attending: Internal Medicine | Admitting: Internal Medicine

## 2020-11-18 ENCOUNTER — Other Ambulatory Visit: Payer: Self-pay

## 2020-11-18 DIAGNOSIS — D509 Iron deficiency anemia, unspecified: Secondary | ICD-10-CM | POA: Insufficient documentation

## 2020-11-18 MED ORDER — SODIUM CHLORIDE 0.9 % IV SOLN
INTRAVENOUS | Status: DC | PRN
  Administered 2020-11-18: 250 mL via INTRAVENOUS

## 2020-11-18 MED ORDER — SODIUM CHLORIDE 0.9 % IV SOLN
510.0000 mg | Freq: Once | INTRAVENOUS | Status: AC
  Administered 2020-11-18: 510 mg via INTRAVENOUS
  Filled 2020-11-18: qty 510

## 2020-11-18 NOTE — Progress Notes (Signed)
PATIENT CARE CENTER NOTE  Diagnosis: Iron Deficiency Anemia    Provider: Durene Fruits, NP   Procedure: Feraheme infusion    Note: Patient received second Feraheme infusion via PIV. Tolerated well with no adverse reaction. Observed patient for 30 minutes post-infusion. Vital signs stable. AVS offered but patient refused. Patient alert, oriented and ambulatory at discharge.

## 2020-12-10 ENCOUNTER — Other Ambulatory Visit: Payer: Self-pay

## 2021-01-04 ENCOUNTER — Other Ambulatory Visit: Payer: Self-pay

## 2021-01-04 MED FILL — Fluticasone-Salmeterol Aer Powder BA 250-50 MCG/ACT: RESPIRATORY_TRACT | 30 days supply | Qty: 60 | Fill #0 | Status: AC

## 2021-01-04 MED FILL — Albuterol Sulfate Inhal Aero 108 MCG/ACT (90MCG Base Equiv): RESPIRATORY_TRACT | 25 days supply | Qty: 18 | Fill #0 | Status: AC

## 2021-03-04 ENCOUNTER — Other Ambulatory Visit: Payer: Self-pay

## 2021-03-04 MED FILL — Fluticasone-Salmeterol Aer Powder BA 250-50 MCG/ACT: RESPIRATORY_TRACT | 30 days supply | Qty: 60 | Fill #1 | Status: AC

## 2021-03-04 MED FILL — Albuterol Sulfate Inhal Aero 108 MCG/ACT (90MCG Base Equiv): RESPIRATORY_TRACT | 25 days supply | Qty: 18 | Fill #1 | Status: AC

## 2021-03-11 IMAGING — CT CT RENAL STONE PROTOCOL
2 of 4 series · 17 of 46 positions shown, 19 images · non-contrast
Comparison: 11/02/2019

CLINICAL DATA: Flank pain, hematuria

EXAM:
CT ABDOMEN AND PELVIS WITHOUT CONTRAST
TECHNIQUE: Multidetector CT imaging of the abdomen and pelvis was performed
following the standard protocol without IV contrast.

[Series 2: axial st · axial · 0.97mm/px · z∈[+1007,+1407]mm · 14 of 90 slices shown, 16 images]
[im 5/90  soft-tissue]
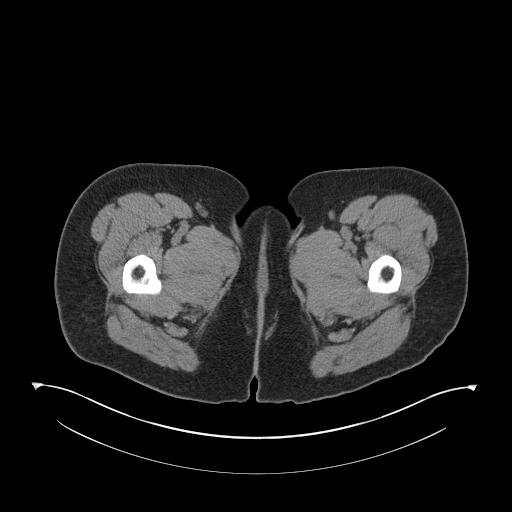
[im 5/90  bone]
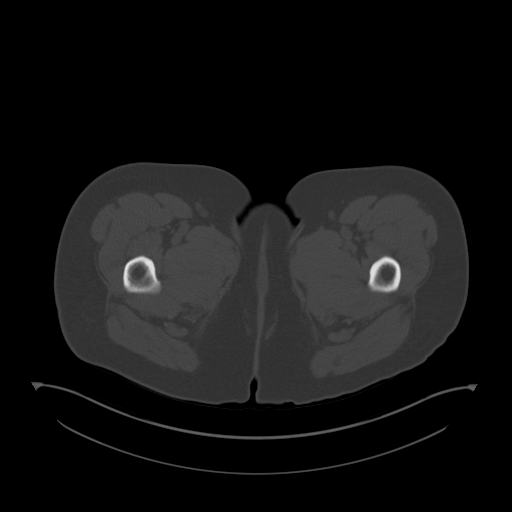
[im 10/90  soft-tissue]
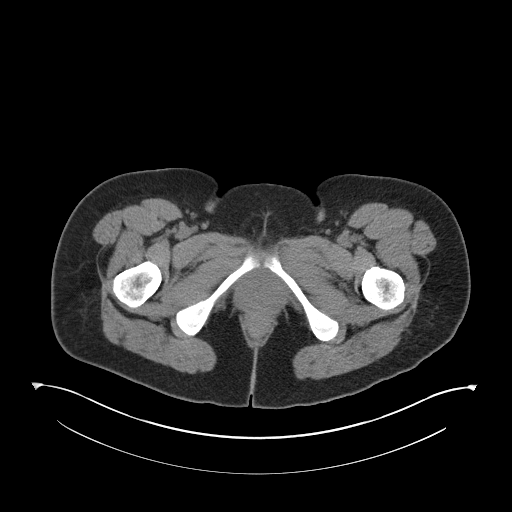
[im 20/90  soft-tissue]
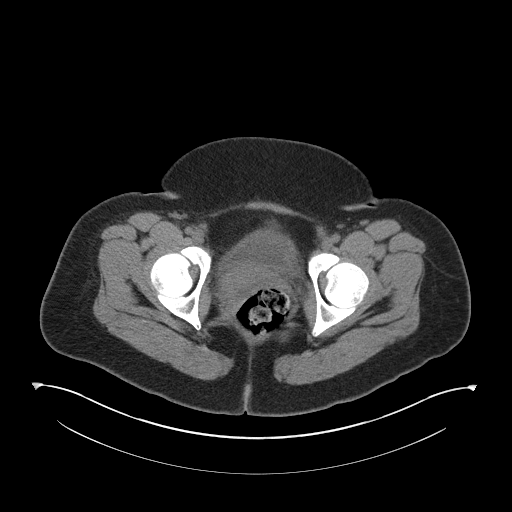
[im 25/90  soft-tissue]
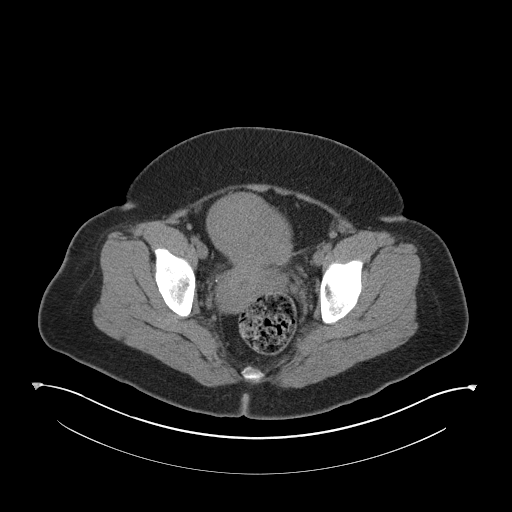
[im 30/90  soft-tissue]
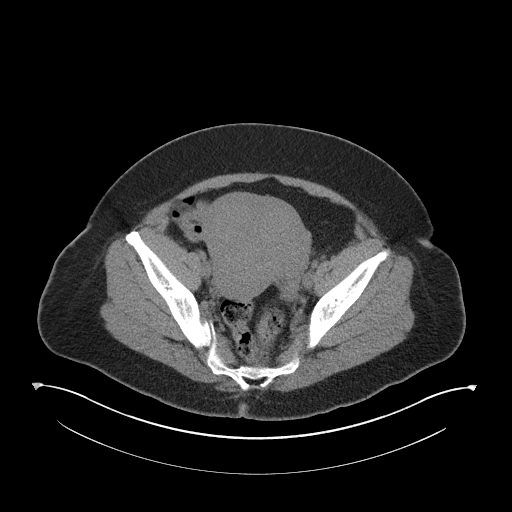
[im 35/90  soft-tissue]
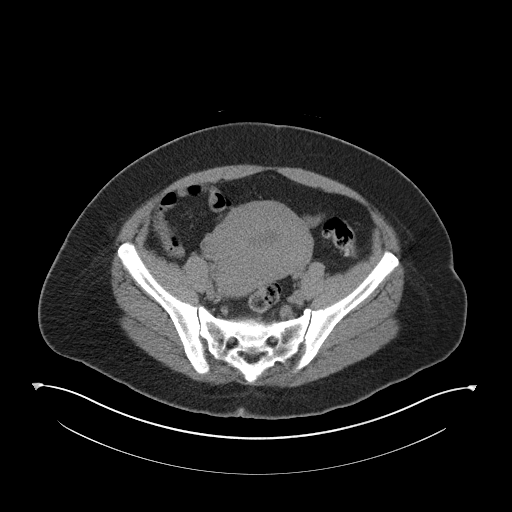
[im 40/90  soft-tissue]
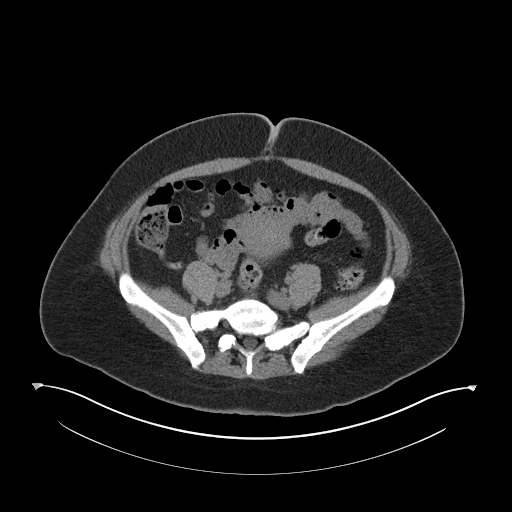
[im 50/90  soft-tissue]
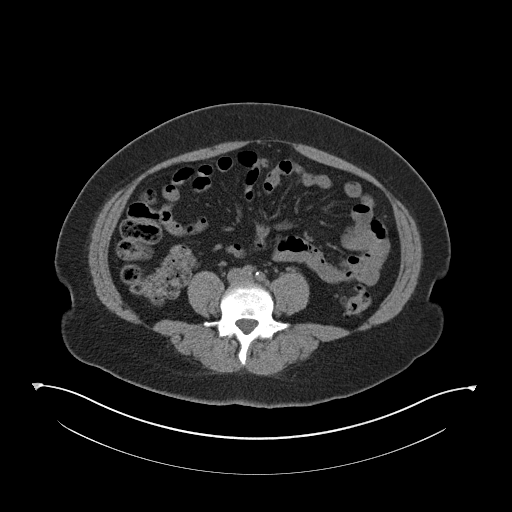
[im 55/90  soft-tissue]
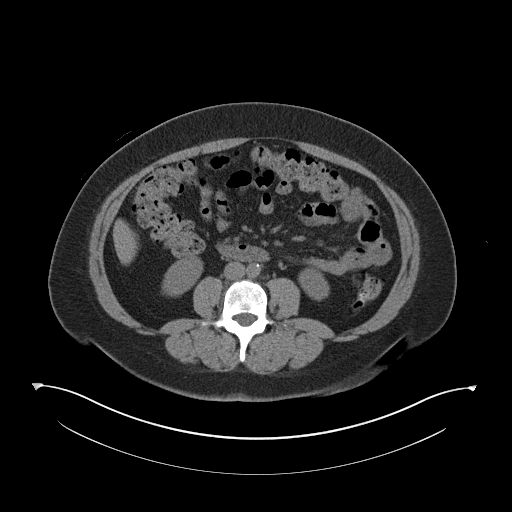
[im 55/90  bone]
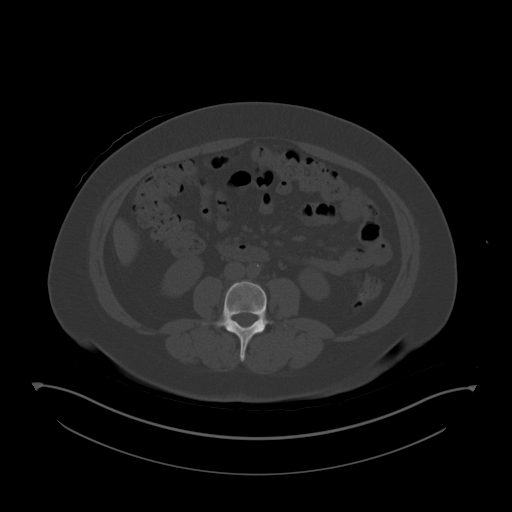
[im 60/90  soft-tissue]
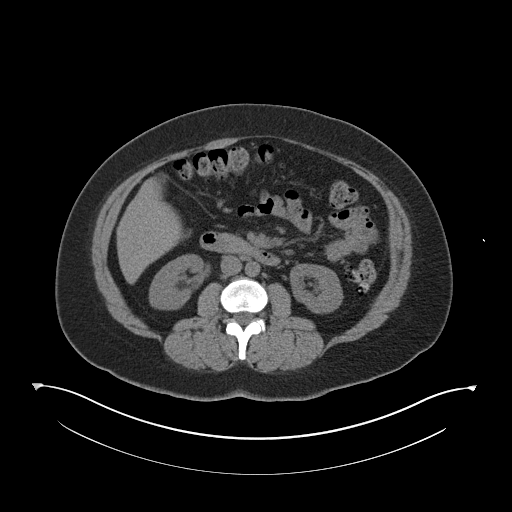
[im 65/90  soft-tissue]
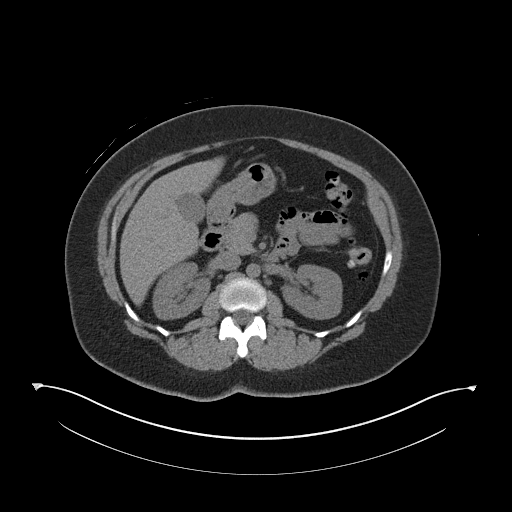
[im 70/90  soft-tissue]
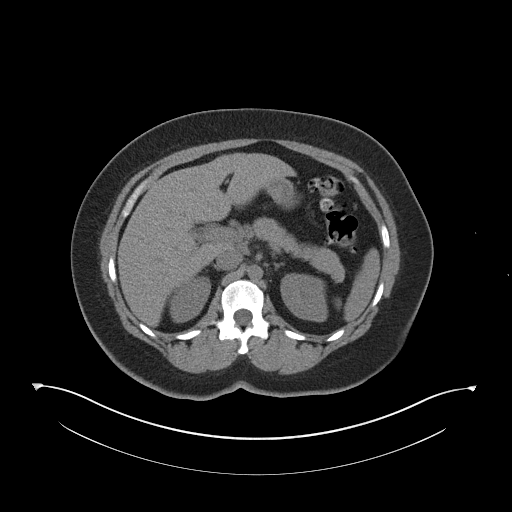
[im 80/90  soft-tissue]
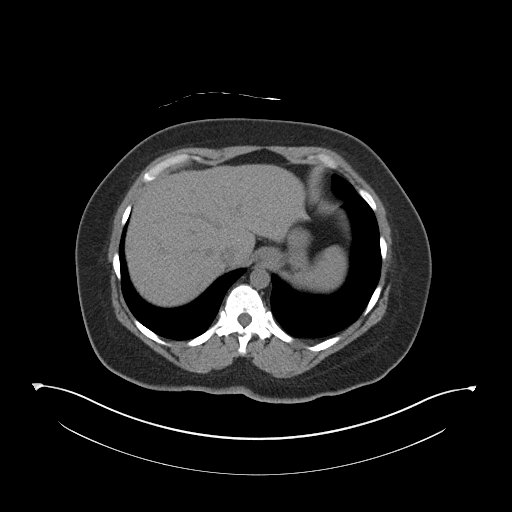
[im 85/90  soft-tissue]
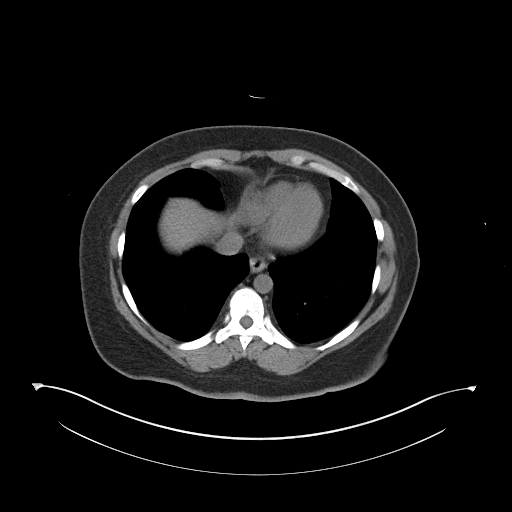

[Series 4: coronal · coronal · 0.80mm/px · 3 of 158 slices shown]
[im 53/158  soft-tissue]
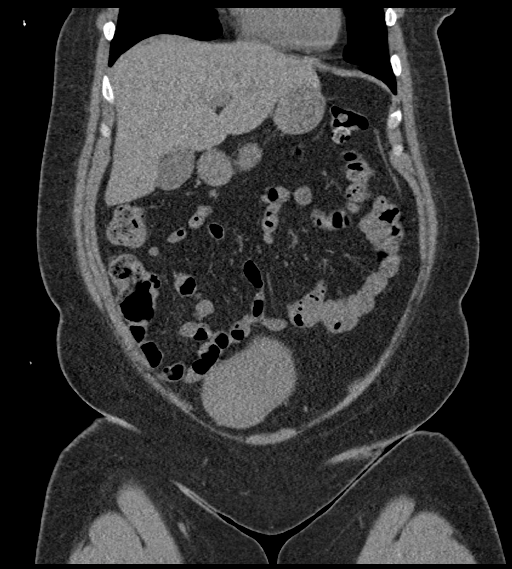
[im 70/158  soft-tissue]
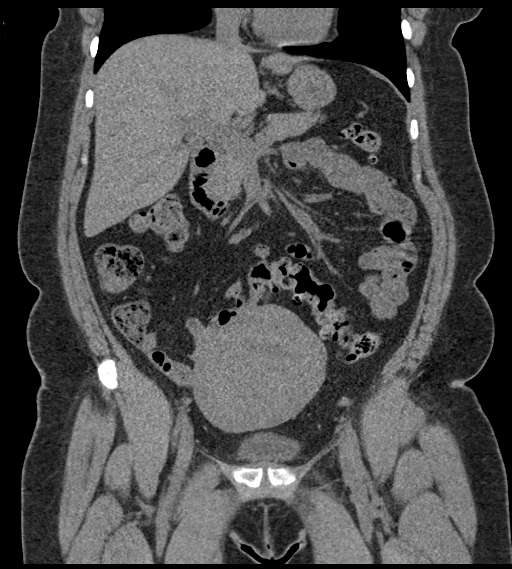
[im 88/158  soft-tissue]
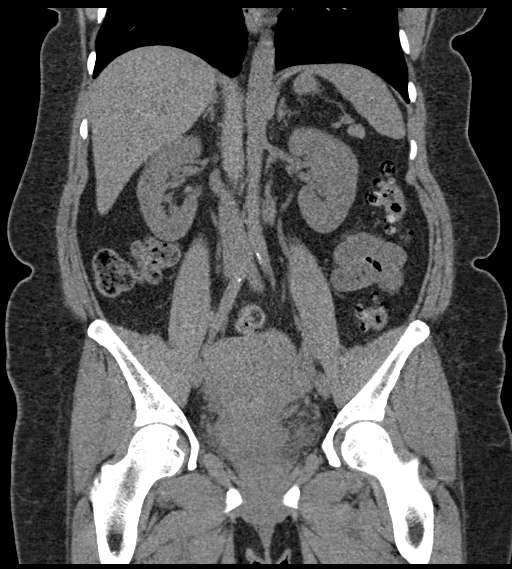

[17 of 46 positions shown; findings below may reference images not displayed]

FINDINGS: Lower chest: No acute abnormality.

Hepatobiliary: No focal liver abnormality is seen. No gallstones,
gallbladder wall thickening, or biliary dilatation.

Pancreas: Unremarkable.

Spleen: Unremarkable.

Adrenals/Urinary Tract: Adrenals are unremarkable. There are no
renal calculi. No hydronephrosis. No ureteral calculi. Bladder is
poorly evaluated due to limited distension.

Stomach/Bowel: Stomach is within normal limits. Bowel is normal in
caliber. Distal colonic diverticulosis. Normal appendix.

Vascular/Lymphatic: Minor aortic atherosclerosis. There are no
enlarged lymph nodes identified.

Reproductive: Enlarged, likely fibroid uterus.  No adnexal mass.

Other: No ascites.  Abdominal wall is unremarkable.

Musculoskeletal: Lower lumbar spine degenerative changes.
IMPRESSION: No acute abnormality or findings to account for reported symptoms.

## 2021-04-06 ENCOUNTER — Telehealth: Payer: Self-pay | Admitting: Family

## 2021-04-06 NOTE — Telephone Encounter (Signed)
Pt states she strongly believes she has covid but has not been tested and has symptoms just like two other people she was exposed and wants to know what should she do wants to see amy and know if there is amy medication for it I told the pt she should be tested as a start  pt last seen 10-20-20

## 2021-05-05 ENCOUNTER — Other Ambulatory Visit: Payer: Self-pay

## 2021-05-05 MED FILL — Albuterol Sulfate Inhal Aero 108 MCG/ACT (90MCG Base Equiv): RESPIRATORY_TRACT | 25 days supply | Qty: 18 | Fill #2 | Status: AC

## 2021-05-30 ENCOUNTER — Other Ambulatory Visit: Payer: Self-pay | Admitting: Physician Assistant

## 2021-05-30 ENCOUNTER — Other Ambulatory Visit: Payer: Self-pay

## 2021-05-30 DIAGNOSIS — J4540 Moderate persistent asthma, uncomplicated: Secondary | ICD-10-CM

## 2021-05-30 MED ORDER — FLUTICASONE-SALMETEROL 250-50 MCG/ACT IN AEPB
1.0000 | INHALATION_SPRAY | Freq: Two times a day (BID) | RESPIRATORY_TRACT | 0 refills | Status: DC
  Filled 2021-05-30: qty 60, 30d supply, fill #0

## 2021-05-30 NOTE — Telephone Encounter (Signed)
..   Requested Prescriptions  Pending Prescriptions Disp Refills  . fluticasone-salmeterol (ADVAIR) 250-50 MCG/ACT AEPB 60 each 0    Sig: Inhale 1 puff into the lungs 2 (two) times daily.     Pulmonology:  Combination Products Passed - 05/30/2021  9:06 AM      Passed - Valid encounter within last 12 months    Recent Outpatient Visits          11 months ago Encounter for examination following treatment at [place], [place]   1 year ago Nausea   [facility] [place], [place], [place]

## 2021-06-03 ENCOUNTER — Other Ambulatory Visit: Payer: Self-pay

## 2021-07-15 ENCOUNTER — Other Ambulatory Visit: Payer: Self-pay | Admitting: Family

## 2021-07-15 ENCOUNTER — Other Ambulatory Visit: Payer: Self-pay

## 2021-07-15 DIAGNOSIS — J4540 Moderate persistent asthma, uncomplicated: Secondary | ICD-10-CM

## 2021-07-18 ENCOUNTER — Other Ambulatory Visit: Payer: Self-pay

## 2021-08-19 ENCOUNTER — Other Ambulatory Visit: Payer: Self-pay

## 2021-08-19 IMAGING — US US PELVIS COMPLETE WITH TRANSVAGINAL
1 series · 15 of 25 positions shown · non-contrast
Comparison: Prior CT from 05/31/2020

CLINICAL DATA: Initial evaluation for history of fibroids.

EXAM:
TRANSABDOMINAL AND TRANSVAGINAL ULTRASOUND OF PELVIS
TECHNIQUE: Both transabdominal and transvaginal ultrasound examinations of the
pelvis were performed. Transabdominal technique was performed for
global imaging of the pelvis including uterus, ovaries, adnexal
regions, and pelvic cul-de-sac. It was necessary to proceed with
endovaginal exam following the transabdominal exam to visualize the
the uterus, endometrium, and ovaries.

[Series 1: us pelvis complete with transvaginal · 15 of 122 slices shown]
[im 1/122]
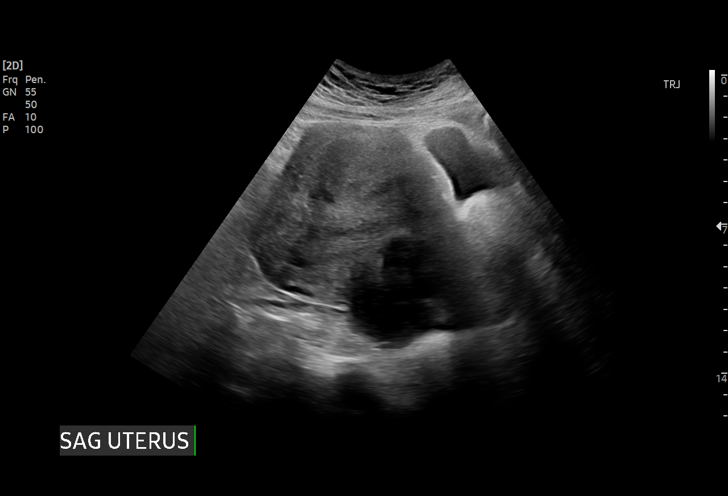
[im 11/122]
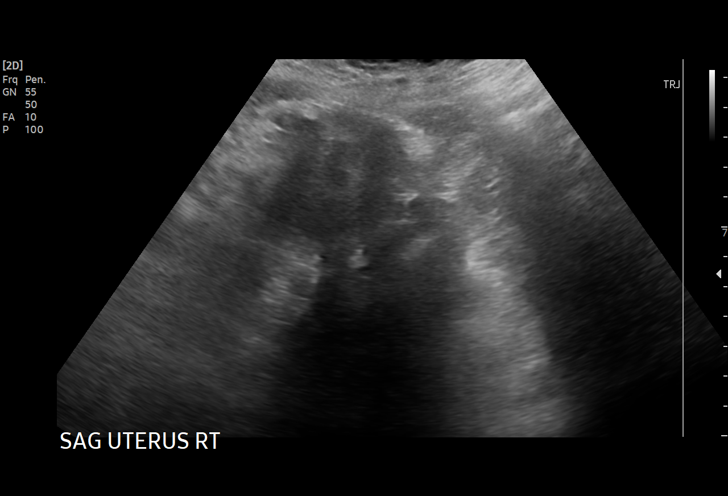
[im 21/122]
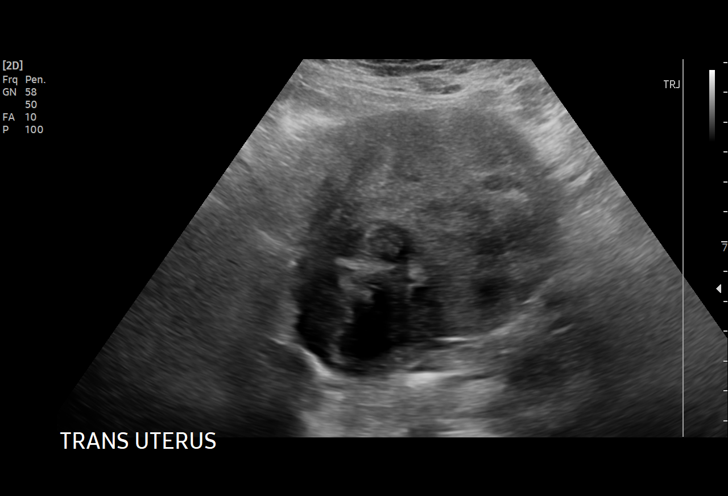
[im 26/122]
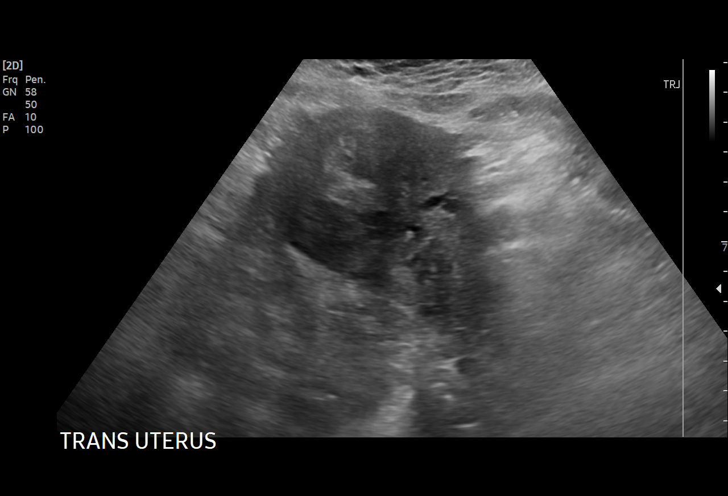
[im 36/122]
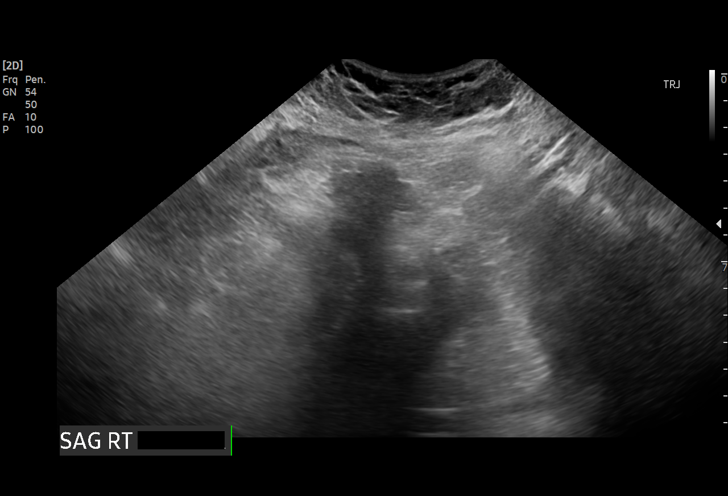
[im 46/122]
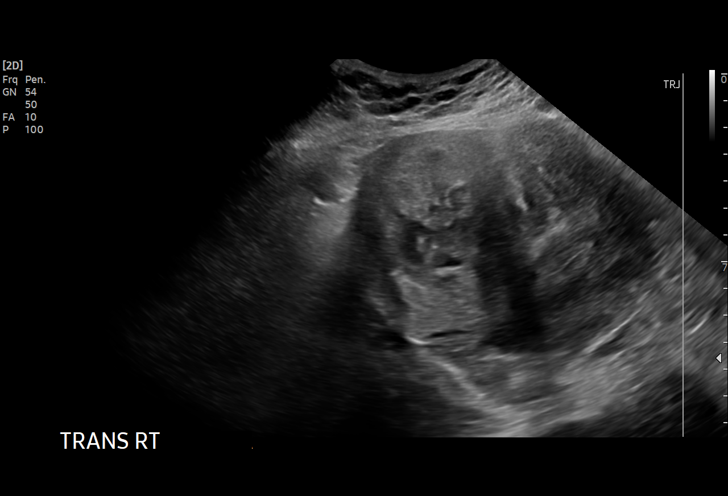
[im 51/122]
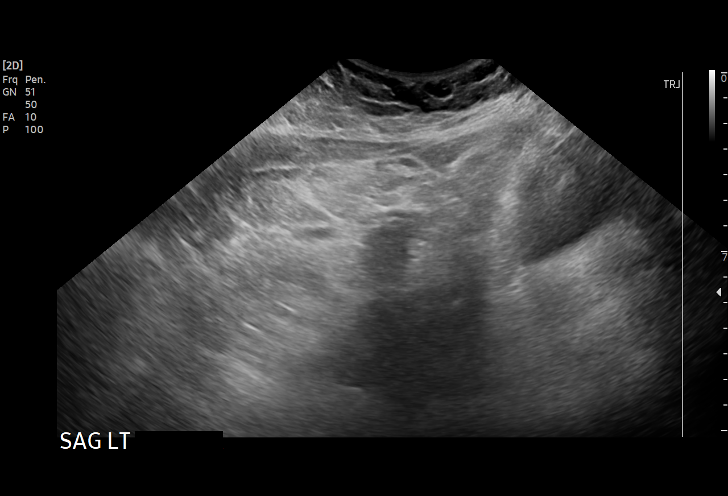
[im 61/122]
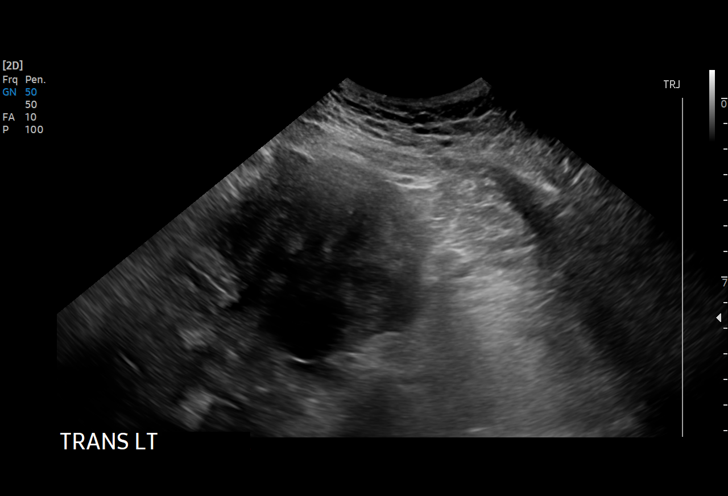
[im 71/122]
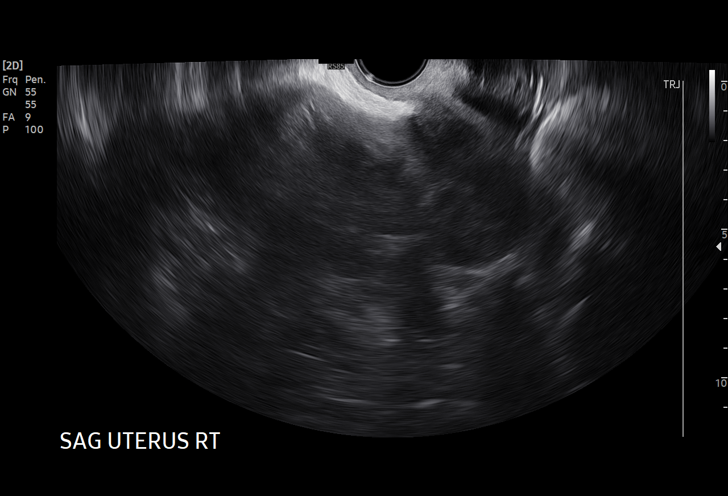
[im 76/122]
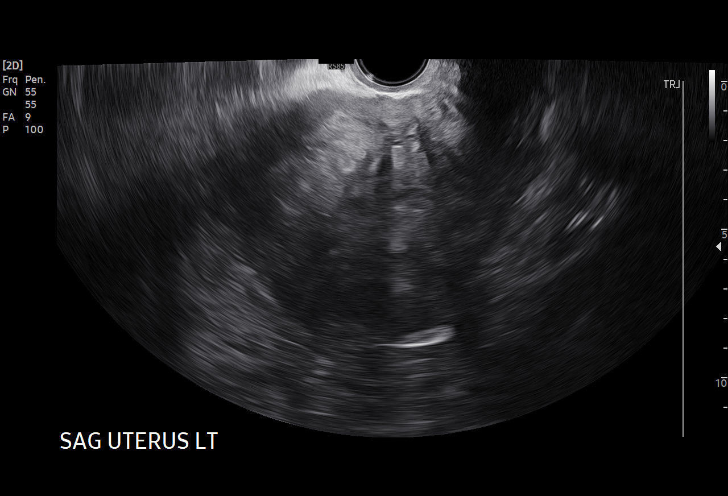
[im 86/122]
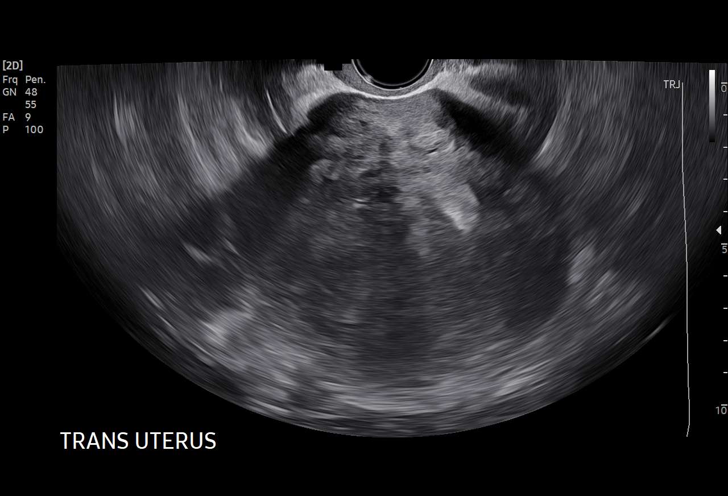
[im 96/122]
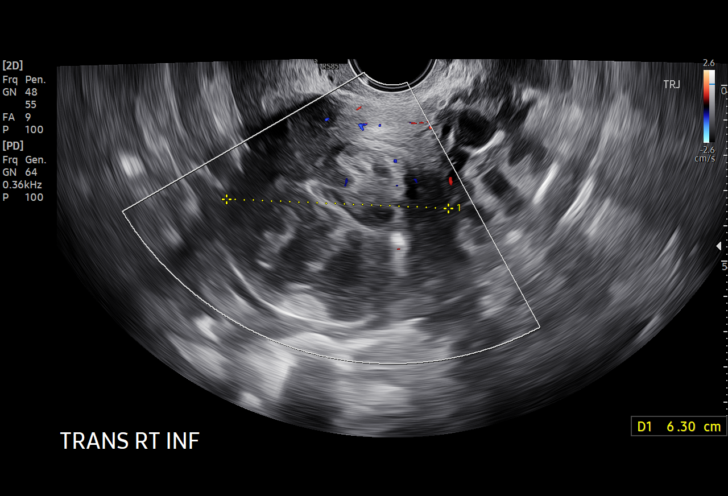
[im 101/122]
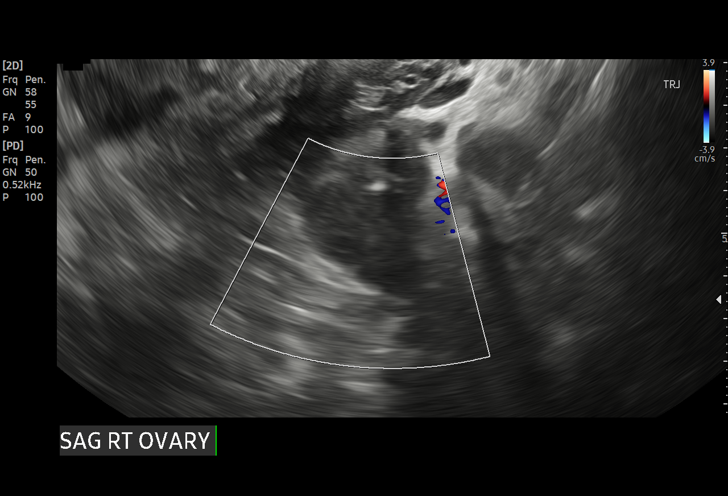
[im 111/122]
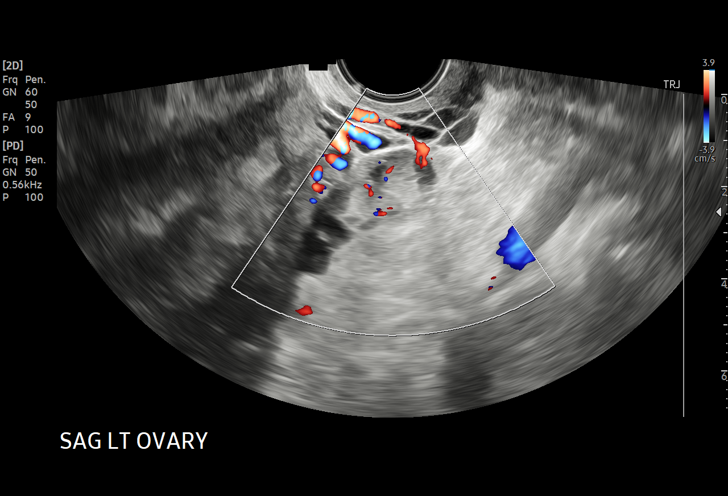
[im 122/122]
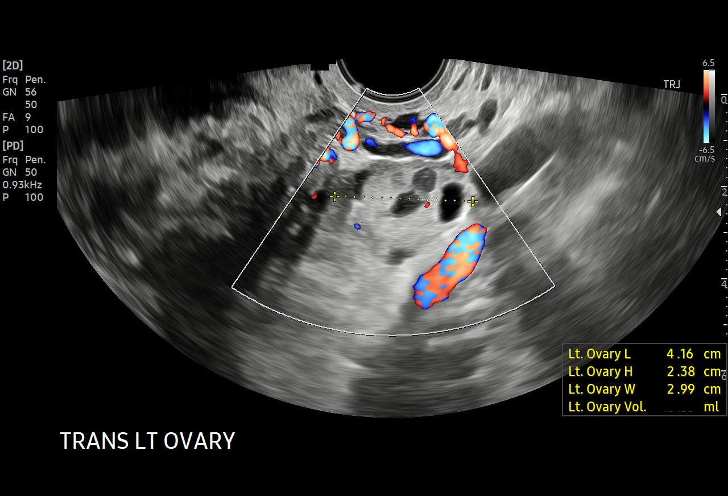

[15 of 25 positions shown; findings below may reference images not displayed]

FINDINGS: Uterus

Measurements: 13.6 x 8.8 x 10.8 cm = volume: 678.9 mL. Multiple
uterine fibroids are seen.

1. 4.9 x 4.3 x 5.2 cm intramural fibroid positioned at the right
uterine fundus.
2. 4.5 x 3.8 x 3.8 cm intramural fibroid positioned at the left
uterine fundus.
3. 6.3 x 5.6 x 5.3 cm intramural fibroid centered at the right lower
uterine segment.
4. 0.8 x 0.8 x 0.5 cm submucosal fibroid at the mid uterine body.

Endometrium

Thickness: 12.5 mm.  No focal abnormality visualized.

Right ovary

Measurements: 3.5 x 2.0 x 1.6 cm = volume: 5.8 mL. Normal
appearance/no adnexal mass.

Left ovary

Measurements: 4.2 x 2.4 x 3.0 cm = volume: 15.3 mL. Normal
appearance/no adnexal mass. Probable small degenerating corpus
luteal cyst noted.

Other findings

Trace free fluid within the pelvis.
IMPRESSION: 1. Enlarged fibroid uterus as detailed above.
2. Otherwise normal pelvic ultrasound with normal sonographic
appearance of the endometrium and ovaries.

## 2021-08-29 ENCOUNTER — Ambulatory Visit: Payer: Self-pay | Admitting: Family Medicine

## 2022-10-11 ENCOUNTER — Other Ambulatory Visit: Payer: Self-pay

## 2022-10-11 DIAGNOSIS — N760 Acute vaginitis: Secondary | ICD-10-CM | POA: Insufficient documentation

## 2022-10-11 DIAGNOSIS — D259 Leiomyoma of uterus, unspecified: Secondary | ICD-10-CM | POA: Insufficient documentation

## 2022-10-11 DIAGNOSIS — B9689 Other specified bacterial agents as the cause of diseases classified elsewhere: Secondary | ICD-10-CM | POA: Insufficient documentation

## 2022-10-11 DIAGNOSIS — Z7951 Long term (current) use of inhaled steroids: Secondary | ICD-10-CM | POA: Insufficient documentation

## 2022-10-11 DIAGNOSIS — D649 Anemia, unspecified: Secondary | ICD-10-CM | POA: Insufficient documentation

## 2022-10-11 DIAGNOSIS — J45909 Unspecified asthma, uncomplicated: Secondary | ICD-10-CM | POA: Insufficient documentation

## 2022-10-11 LAB — URINALYSIS, ROUTINE W REFLEX MICROSCOPIC
Bacteria, UA: NONE SEEN
Bilirubin Urine: NEGATIVE
Glucose, UA: NEGATIVE mg/dL
Hgb urine dipstick: NEGATIVE
Ketones, ur: NEGATIVE mg/dL
Leukocytes,Ua: NEGATIVE
Nitrite: NEGATIVE
Protein, ur: 30 mg/dL — AB
Specific Gravity, Urine: 1.032 — ABNORMAL HIGH (ref 1.005–1.030)
pH: 5 (ref 5.0–8.0)

## 2022-10-11 LAB — COMPREHENSIVE METABOLIC PANEL
ALT: 13 U/L (ref 0–44)
AST: 19 U/L (ref 15–41)
Albumin: 3.9 g/dL (ref 3.5–5.0)
Alkaline Phosphatase: 42 U/L (ref 38–126)
Anion gap: 7 (ref 5–15)
BUN: 14 mg/dL (ref 6–20)
CO2: 23 mmol/L (ref 22–32)
Calcium: 8.6 mg/dL — ABNORMAL LOW (ref 8.9–10.3)
Chloride: 106 mmol/L (ref 98–111)
Creatinine, Ser: 0.78 mg/dL (ref 0.44–1.00)
GFR, Estimated: >60 mL/min (ref >60)
Glucose, Bld: 118 mg/dL — ABNORMAL HIGH (ref 70–99)
Potassium: 3.8 mmol/L (ref 3.5–5.1)
Sodium: 136 mmol/L (ref 135–145)
Total Bilirubin: 0.8 mg/dL (ref 0.3–1.2)
Total Protein: 7.1 g/dL (ref 6.5–8.1)

## 2022-10-11 LAB — POC URINE PREG, ED: Preg Test, Ur: NEGATIVE

## 2022-10-11 LAB — WET PREP, GENITAL
Sperm: NONE SEEN
Trich, Wet Prep: NONE SEEN
WBC, Wet Prep HPF POC: <10 (ref <10)
Yeast Wet Prep HPF POC: NONE SEEN

## 2022-10-11 NOTE — ED Triage Notes (Signed)
Pt reports menstrual cycle x 1.5 wks with continued spotting. States cycle typically only last 1 wk or less. Reports continued abd cramping. Reports vaginal discomfort as well with dryness and itching. Reports soap appears to irritate area as well. Pt does endorse sexual activity. Reports hx of tubal ligation in 2003.

## 2022-10-12 ENCOUNTER — Emergency Department
Admission: EM | Admit: 2022-10-12 | Discharge: 2022-10-12 | Disposition: A | Discharge status: Home / Self Care | Payer: Self-pay | Attending: Emergency Medicine | Admitting: Emergency Medicine

## 2022-10-12 ENCOUNTER — Emergency Department: Payer: Self-pay

## 2022-10-12 DIAGNOSIS — D649 Anemia, unspecified: Secondary | ICD-10-CM

## 2022-10-12 DIAGNOSIS — D259 Leiomyoma of uterus, unspecified: Secondary | ICD-10-CM

## 2022-10-12 DIAGNOSIS — N760 Acute vaginitis: Secondary | ICD-10-CM

## 2022-10-12 LAB — CBC WITH DIFFERENTIAL/PLATELET
Abs Immature Granulocytes: 0.02 10*3/uL (ref 0.00–0.07)
Basophils Absolute: 0 10*3/uL (ref 0.0–0.1)
Basophils Relative: 1 %
Eosinophils Absolute: 0.3 10*3/uL (ref 0.0–0.5)
Eosinophils Relative: 5 %
HCT: 31.9 % — ABNORMAL LOW (ref 36.0–46.0)
Hemoglobin: 9.1 g/dL — ABNORMAL LOW (ref 12.0–15.0)
Immature Granulocytes: 0 %
Lymphocytes Relative: 28 %
Lymphs Abs: 1.4 10*3/uL (ref 0.7–4.0)
MCH: 19.8 pg — ABNORMAL LOW (ref 26.0–34.0)
MCHC: 28.5 g/dL — ABNORMAL LOW (ref 30.0–36.0)
MCV: 69.5 fL — ABNORMAL LOW (ref 80.0–100.0)
Monocytes Absolute: 0.4 10*3/uL (ref 0.1–1.0)
Monocytes Relative: 8 %
Neutro Abs: 3 10*3/uL (ref 1.7–7.7)
Neutrophils Relative %: 58 %
Platelets: 256 10*3/uL (ref 150–400)
RBC: 4.59 MIL/uL (ref 3.87–5.11)
RDW: 21.2 % — ABNORMAL HIGH (ref 11.5–15.5)
Smear Review: NORMAL
WBC: 5.2 10*3/uL (ref 4.0–10.5)
nRBC: 0 % (ref 0.0–0.2)

## 2022-10-12 LAB — CHLAMYDIA/NGC RT PCR (ARMC ONLY)
Chlamydia Tr: NOT DETECTED
N gonorrhoeae: NOT DETECTED

## 2022-10-12 MED ORDER — METRONIDAZOLE 500 MG PO TABS
500.0000 mg | ORAL_TABLET | Freq: Once | ORAL | Status: AC
  Administered 2022-10-12: 500 mg via ORAL
  Filled 2022-10-12: qty 1

## 2022-10-12 MED ORDER — FERROUS SULFATE 325 (65 FE) MG PO TABS: 325.0000 mg | ORAL_TABLET | Freq: Every day | ORAL | 3 refills | Status: DC

## 2022-10-12 MED ORDER — METRONIDAZOLE 500 MG PO TABS: 500.0000 mg | ORAL_TABLET | Freq: Two times a day (BID) | ORAL | 0 refills | Status: DC

## 2022-10-12 NOTE — Discharge Instructions (Addendum)
You may alternate Tylenol 1000 mg every 6 hours as needed for pain, fever and Ibuprofen 800 mg every 6-8 hours as needed for pain, fever.  Please take Ibuprofen with food.  Do not take more than 4000 mg of Tylenol (acetaminophen) in a 24 hour period.  Your hemoglobin was low today but this appears chronic for you.  Please continue iron tablets.  I have provided you with a new prescription.

## 2022-10-12 NOTE — ED Provider Notes (Signed)
[facility] Provider Note    Event Date/Time   First MD Initiated Contact with Patient 10/12/22 0220     (approximate)   History   Vaginal Bleeding   HPI  Alexis Beck is a 47 y.o. female history of uterine fibroids, bipolar disorder, asthma, anxiety, depression who presents to the emergency department with vaginal spotting that has been ongoing for about a week after her last menstrual cycle which is abnormal for her.  She states she has also noticed a vaginal odor, itching and was concerned that she either had an STI or bacterial vaginosis.  Did have some abdominal cramping earlier tonight she took ibuprofen for which is gone.  No fevers, vomiting, diarrhea, dysuria.   History provided by patient.    Past Medical History:  Diagnosis Date   Anxiety    Asthma    Bipolar disorder ([place])    Depression    Fibroids    Diagnosed around age 46    Past Surgical History:  Procedure Laterality Date   NECK SURGERY  10/2017   TUBAL LIGATION  2003    MEDICATIONS:  Prior to Admission medications   Medication Sig Start Date End Date Taking? Authorizing Provider  albuterol (VENTOLIN HFA) 108 (90 Base) MCG/ACT inhaler INHALE 1-2 PUFFS INTO THE LUNGS EVERY 6 HOURS AS NEEDED FOR WHEEZING OR SHORTNESS OF BREATH 10/20/20 10/20/21  Camillia Herter, NP  Ascorbic Acid (VITAMIN C PO) Take by mouth.    [provider]  ferrous sulfate 325 (65 FE) MG tablet Take 1 tablet (325 mg total) by mouth daily. 09/19/20   Camillia Herter, NP  fluticasone-salmeterol (ADVAIR) 250-50 MCG/ACT AEPB Inhale 1 puff into the lungs 2 (two) times daily. 05/30/21 05/30/22  Argentina Donovan, PA-C  ibuprofen (ADVIL) 200 MG tablet Take 400 mg by mouth every 6 (six) hours as needed.    [provider]  MELATONIN PO Take by mouth.    [provider]  naproxen (NAPROSYN) 500 MG tablet Take 1 tablet (500 mg total) by mouth 2 (two) times daily with a meal. As needed for pain  10/27/20   Anyanwu, Sallyanne Havers, MD  tranexamic acid (LYSTEDA) 650 MG TABS tablet Take 2 tablets (1,300 mg total) by mouth 3 (three) times daily. Take during menses for a maximum of five days 10/27/20   Anyanwu, Sallyanne Havers, MD  VITAMIN D PO Take by mouth.    [provider]  cetirizine (ZYRTEC) 10 MG tablet Take 1 tablet (10 mg total) by mouth daily. Patient not taking: Reported on 11/02/2019 10/17/17 11/02/19  Maczis, Barth Kirks, PA-C  fluticasone [facility]) 50 MCG/ACT nasal spray Place 2 sprays into both nostrils daily. Patient not taking: Reported on 11/02/2019 10/17/17 11/02/19  Lorelle Gibbs    Physical Exam   Triage Vital Signs: ED Triage Vitals [10/11/22 2222]  Enc Vitals Group     BP (!) 165/87     Pulse Rate 79     Resp 18     Temp 97.7 F (36.5 C)     Temp Source Oral     SpO2 99 %     Weight 185 lb (83.9 kg)     Height '5\' 5"'$  (1.651 m)     Head Circumference      Peak Flow      Pain Score 3     Pain Loc      Pain Edu?      Excl. in [place]?  Most recent vital signs: Vitals:   10/11/22 2222 10/12/22 0211  BP: (!) 165/87 (!) 152/76  Pulse: 79 74  Resp: 18   Temp: 97.7 F (36.5 C)   SpO2: 99% 100%    CONSTITUTIONAL: Alert, responds appropriately to questions. Well-appearing; well-nourished HEAD: Normocephalic, atraumatic EYES: Conjunctivae clear, pupils appear equal, sclera nonicteric ENT: normal nose; moist mucous membranes NECK: Supple, normal ROM CARD: RRR; S1 and S2 appreciated RESP: Normal chest excursion without splinting or tachypnea; breath sounds clear and equal bilaterally; no wheezes, no rhonchi, no rales, no hypoxia or respiratory distress, speaking full sentences ABD/GI: Non-distended; soft, non-tender, no rebound, no guarding, no peritoneal signs BACK: The back appears normal EXT: Normal ROM in all joints; no deformity noted, no edema SKIN: Normal color for age and race; warm; no rash on exposed skin NEURO: Moves all extremities equally,  normal speech PSYCH: The patient's mood and manner are appropriate.   ED Results / Procedures / Treatments   LABS: (all labs ordered are listed, but only abnormal results are displayed) Labs Reviewed  WET PREP, GENITAL - Abnormal; Notable for the following components:      Result Value   Clue Cells Wet Prep HPF POC PRESENT (*)    All other components within normal limits  COMPREHENSIVE METABOLIC PANEL - Abnormal; Notable for the following components:   Glucose, Bld 118 (*)    Calcium 8.6 (*)    All other components within normal limits  URINALYSIS, ROUTINE W REFLEX MICROSCOPIC - Abnormal; Notable for the following components:   Color, Urine YELLOW (*)    APPearance CLEAR (*)    Specific Gravity, Urine 1.032 (*)    Protein, ur 30 (*)    All other components within normal limits  CBC WITH DIFFERENTIAL/PLATELET - Abnormal; Notable for the following components:   Hemoglobin 9.1 (*)    HCT 31.9 (*)    MCV 69.5 (*)    MCH 19.8 (*)    MCHC 28.5 (*)    RDW 21.2 (*)    All other components within normal limits  CHLAMYDIA/NGC RT PCR ([facility] ONLY)            CBC WITH DIFFERENTIAL/PLATELET  POC URINE PREG, ED     EKG:  RADIOLOGY: My personal review and interpretation of imaging: Pelvic ultrasound shows uterine fibroids.  Normal blood flow to both ovaries.  I have personally reviewed all radiology reports.   US PELVIC COMPLETE W TRANSVAGINAL AND TORSION R/O  Result Date: 10/12/2022 CLINICAL DATA:  Dysfunctional uterine bleeding EXAM: TRANSABDOMINAL AND TRANSVAGINAL ULTRASOUND OF PELVIS DOPPLER ULTRASOUND OF OVARIES TECHNIQUE: Both transabdominal and transvaginal ultrasound examinations of the pelvis were performed. Transabdominal technique was performed for global imaging of the pelvis including uterus, ovaries, adnexal regions, and pelvic cul-de-sac. It was necessary to proceed with endovaginal exam following the transabdominal exam to visualize the uterus, endometrium, ovaries and  adnexa. Color and duplex Doppler ultrasound was utilized to evaluate blood flow to the ovaries. COMPARISON:  11/08/2020 FINDINGS: Uterus Measurements: 15.7 x 9.9 x 12.2 cm = volume: 986 mL. Enlarged with multiple fibroids. Largest fibroid in the lower uterine segment measures up to 8.8 cm. Two fundal fibroids measure up to 4.5 and 5.1 cm. Endometrium Thickness: Normal thickness and appearance were visualized, 13 mm. No focal abnormality visualized. Right ovary Measurements: Not visualized.  No adnexal mass seen. Left ovary Measurements: Not visualized.  No adnexal mass seen. Pulsed Doppler evaluation of both ovaries demonstrates normal low-resistance arterial and venous waveforms. Other  findings No abnormal free fluid. IMPRESSION: Enlarged fibroid uterus. Electronically Signed   By: Rolm Baptise M.D.   On: 10/12/2022 01:42     PROCEDURES:  Critical Care performed: No      Procedures    IMPRESSION / MDM / ASSESSMENT AND PLAN / ED COURSE  I reviewed the triage vital signs and the nursing notes.    Patient here with vaginal spotting, discharge, odor.    DIFFERENTIAL DIAGNOSIS (includes but not limited to):   BV, yeast, STI, UTI, less likely PID, torsion, TOA given benign abdominal exam   Patient's presentation is most consistent with acute complicated illness / injury requiring diagnostic workup.   PLAN: Workup initiated from triage.  Hemoglobin today is 9.1 which is chronic for her.  Will refill her iron supplementation.  Normal electrolytes, renal function, LFTs.  Urine does not appear infected today.  Gonorrhea and Chlamydia are negative.  She is positive for clue cells and this is likely the cause of her spotting, itching and odor.  Will start her on Flagyl.  Her pregnancy test today is negative.  Transvaginal ultrasound reviewed and interpreted by myself and the radiologist shows a fibroid uterus.  She has a known history of this.  Normal blood flow to both ovaries and no other acute  abnormality.  Abdominal exam here is benign.  She states that her vaginal bleeding is only mild spotting.  No indication to start her on hormone therapy or less Diette at this time but will give outpatient OB/GYN follow-up if symptoms not improving.  Recommended over-the-counter Tylenol, Motrin as needed for pain.  Currently pain-free at this time.  Patient comfortable with this plan.   MEDICATIONS GIVEN IN ED: Medications  metroNIDAZOLE (FLAGYL) tablet 500 mg (500 mg Oral Given 10/12/22 0231)     ED COURSE:  At this time, I do not feel there is any life-threatening condition present. I reviewed all nursing notes, vitals, pertinent previous records.  All lab and urine results, EKGs, imaging ordered have been independently reviewed and interpreted by myself.  I reviewed all available radiology reports from any imaging ordered this visit.  Based on my assessment, I feel the patient is safe to be discharged home without further emergent workup and can continue workup as an outpatient as needed. Discussed all findings, treatment plan as well as usual and customary return precautions.  They verbalize understanding and are comfortable with this plan.  Outpatient follow-up has been provided as needed.  All questions have been answered.    CONSULTS:  none   OUTSIDE RECORDS REVIEWED: Reviewed last office visit with Dr. Harolyn Rutherford on 10/27/2020.       FINAL CLINICAL IMPRESSION(S) / ED DIAGNOSES   Final diagnoses:  Uterine leiomyoma, unspecified location  BV (bacterial vaginosis)  Anemia, unspecified type     Rx / DC Orders   ED Discharge Orders          Ordered    metroNIDAZOLE (FLAGYL) 500 MG tablet  2 times daily        10/12/22 0228    ferrous sulfate 325 (65 FE) MG tablet  Daily,   Status:  Discontinued        10/12/22 0232    ferrous sulfate 325 (65 FE) MG tablet  Daily        10/12/22 0232             Note:  This document was prepared using Dragon voice recognition software  and may include unintentional dictation errors.  Telissa Palmisano, Delice Bison, DO 10/12/22 [id number]

## 2023-07-08 DIAGNOSIS — Z419 Encounter for procedure for purposes other than remedying health state, unspecified: Secondary | ICD-10-CM | POA: Diagnosis not present

## 2023-08-08 DIAGNOSIS — Z419 Encounter for procedure for purposes other than remedying health state, unspecified: Secondary | ICD-10-CM | POA: Diagnosis not present

## 2023-08-27 ENCOUNTER — Encounter (HOSPITAL_COMMUNITY): Payer: Self-pay

## 2023-08-27 ENCOUNTER — Emergency Department (HOSPITAL_COMMUNITY)
Admission: EM | Admit: 2023-08-27 | Discharge: 2023-08-27 | Disposition: A | Discharge status: Home / Self Care | Payer: Medicaid Other | Attending: Emergency Medicine | Admitting: Emergency Medicine

## 2023-08-27 ENCOUNTER — Other Ambulatory Visit: Payer: Self-pay

## 2023-08-27 DIAGNOSIS — T7840XA Allergy, unspecified, initial encounter: Secondary | ICD-10-CM | POA: Diagnosis not present

## 2023-08-27 DIAGNOSIS — L270 Generalized skin eruption due to drugs and medicaments taken internally: Secondary | ICD-10-CM

## 2023-08-27 DIAGNOSIS — R21 Rash and other nonspecific skin eruption: Secondary | ICD-10-CM | POA: Diagnosis not present

## 2023-08-27 DIAGNOSIS — L233 Allergic contact dermatitis due to drugs in contact with skin: Secondary | ICD-10-CM | POA: Insufficient documentation

## 2023-08-27 MED ORDER — PREDNISONE 20 MG PO TABS: 40.0000 mg | ORAL_TABLET | Freq: Every day | ORAL | 0 refills | Status: DC

## 2023-08-27 MED ORDER — DIPHENHYDRAMINE HCL 25 MG PO CAPS
25.0000 mg | ORAL_CAPSULE | Freq: Once | ORAL | Status: AC
  Administered 2023-08-27: 25 mg via ORAL
  Filled 2023-08-27: qty 1

## 2023-08-27 NOTE — ED Triage Notes (Signed)
Pt came in via POV d/t using "Jock itch cream" from [facility] for feeling irritated on her inner thighs. She then reports after usage, over the weekend she noticed her lips began swelling, then her Rt eye swelling this morning. Upon arrival she denies difficulty breathing or hives, just inflamed in her "lady region" & bil inner thigh areas she used it on & continued swelling in Rt eye & lips. A/Ox4. Denies pain but a "discomfort level rated 5/10.

## 2023-08-27 NOTE — Discharge Instructions (Addendum)
As we discussed having that you may be having a reaction to the clotrimazole cream that you are using, would discontinue use, you can use a barrier cream such as Desitin in the skin folds and thighs to help with friction, if you continue to have some redness, irritation after 1 to 2 weeks of using the Desitin you can try a fungal powder which is not in the AZOLE class such as Tinactin.  I recommend that you take 25 mg of Benadryl orally today and possibly tomorrow if you are still having some facial swelling.  Please take the entire course of steroids that I prescribed.  Please return if you have significant worsening of your rash, swelling of your throat, difficulty breathing, nausea, vomiting despite all of the above.

## 2023-08-27 NOTE — ED Provider Notes (Signed)
[place] EMERGENCY DEPARTMENT AT [facility] Provider Note   CSN: [id number] Arrival date & time: 08/27/23  0800     History  Chief Complaint  Patient presents with   Allergic Reaction    Alexis Beck is a 48 y.o. female with overall noncontributory past medical history presents concern for rash to inner thighs, with some swelling of face, lips noted overnight.  Patient reports that she has been having some irritation in the groin, and started to use a jock itch cream with active agent clotrimazole.  She does not remember getting any on her face but says that it is possible that she touch her face after using.  She denies any shortness of breath, mouth or throat swelling, nausea, vomiting, chest pain.  She denies any other location of rash or hives.  HPI     Home Medications Prior to Admission medications   Medication Sig Start Date End Date Taking? Authorizing Provider  MAGNESIUM PO Take 1 tablet by mouth daily.   Yes [provider]  Multiple Vitamins-Minerals (ZINC PO) Take 1 tablet by mouth daily.   Yes [provider]  predniSONE (DELTASONE) 20 MG tablet Take 2 tablets (40 mg total) by mouth daily. 08/27/23  Yes Koda Routon H, PA-C  Ascorbic Acid (VITAMIN C PO) Take 1 tablet by mouth daily.    [provider]  ferrous sulfate 325 (65 FE) MG tablet Take 1 tablet (325 mg total) by mouth daily. 10/12/22   Ward, Layla Maw, DO  VITAMIN D PO Take 1 tablet by mouth daily.    [provider]  cetirizine (ZYRTEC) 10 MG tablet Take 1 tablet (10 mg total) by mouth daily. Patient not taking: Reported on 11/02/2019 10/17/17 11/02/19  Maczis, Elmer Sow, PA-C  fluticasone [facility]) 50 MCG/ACT nasal spray Place 2 sprays into both nostrils daily. Patient not taking: Reported on 11/02/2019 10/17/17 11/02/19  Maczis, Elmer Sow, PA-C      Allergies    Shellfish allergy    Review of Systems   Review of Systems  All other systems reviewed and  are negative.   Physical Exam Updated Vital Signs BP 129/77 (BP Location: Right Arm)   Pulse 85   Temp 99.2 F (37.3 C) (Oral)   Resp 18   Ht 5\' 4"  (1.626 m)   Wt 87.5 kg   SpO2 99%   BMI 33.13 kg/m  Physical Exam Vitals and nursing note reviewed.  Constitutional:      General: She is not in acute distress.    Appearance: Normal appearance.  HENT:     Head: Normocephalic and atraumatic.     Comments: Patient with some subtle soft tissue swelling of the right eye, and right lip.  No proptosis, EOMs intact, sclera are unremarkable bilaterally. Eyes:     General:        Right eye: No discharge.        Left eye: No discharge.  Cardiovascular:     Rate and Rhythm: Normal rate and regular rhythm.  Pulmonary:     Effort: Pulmonary effort is normal. No respiratory distress.  Musculoskeletal:        General: No deformity.  Skin:    General: Skin is warm and dry.     Comments: Patient with some redness, warmth noted to inner thighs without any vesicular or satellite lesions, no fluctuance, no evidence of cellulitis.  Neurological:     Mental Status: She is alert and oriented to person, place,  and time.  Psychiatric:        Mood and Affect: Mood normal.        Behavior: Behavior normal.     ED Results / Procedures / Treatments   Labs (all labs ordered are listed, but only abnormal results are displayed) Labs Reviewed - No data to display  EKG None  Radiology No results found.  Procedures Procedures    Medications Ordered in ED Medications  diphenhydrAMINE (BENADRYL) capsule 25 mg (25 mg Oral Given 08/27/23 0910)    ED Course/ Medical Decision Making/ A&P                                 Medical Decision Making  This is an overall well-appearing 48 year old female who presents with concern for possible allergic reaction after using jock itch cream in her groin this weekend.  On exam patient with some redness, irritation in the inner thigh, no hives, she does  have some small amount of swelling on the right side of face, and around the eye.  She has no proptosis, she has no redness of sclera, EOMs intact.  No evidence of anaphylaxis, discussed conservative treatment with Benadryl alone versus Benadryl and prednisone, encourage Desitin as barrier cream on the inner thighs for probable intertrigo, she can trial Tinactin powder if no improvement with Desitin alone. Patient understands and agrees to plan. Discharged in stable at this time. Final Clinical Impression(s) / ED Diagnoses Final diagnoses:  Allergic drug rash    Rx / DC Orders ED Discharge Orders          Ordered    predniSONE (DELTASONE) 20 MG tablet  Daily        08/27/23 0904              Olene Floss, PA-C 08/27/23 0272    Laurence Spates, MD 08/30/23 1446

## 2023-09-08 DIAGNOSIS — Z419 Encounter for procedure for purposes other than remedying health state, unspecified: Secondary | ICD-10-CM | POA: Diagnosis not present

## 2023-10-06 DIAGNOSIS — Z419 Encounter for procedure for purposes other than remedying health state, unspecified: Secondary | ICD-10-CM | POA: Diagnosis not present

## 2023-11-09 ENCOUNTER — Emergency Department (HOSPITAL_COMMUNITY)

## 2023-11-09 ENCOUNTER — Emergency Department (HOSPITAL_COMMUNITY)
Admission: EM | Admit: 2023-11-09 | Discharge: 2023-11-09 | Disposition: A | Discharge status: Home / Self Care | Attending: Emergency Medicine | Admitting: Emergency Medicine

## 2023-11-09 ENCOUNTER — Other Ambulatory Visit: Payer: Self-pay

## 2023-11-09 DIAGNOSIS — J209 Acute bronchitis, unspecified: Secondary | ICD-10-CM | POA: Diagnosis not present

## 2023-11-09 DIAGNOSIS — J45909 Unspecified asthma, uncomplicated: Secondary | ICD-10-CM | POA: Diagnosis not present

## 2023-11-09 DIAGNOSIS — Z7952 Long term (current) use of systemic steroids: Secondary | ICD-10-CM | POA: Insufficient documentation

## 2023-11-09 DIAGNOSIS — Z7951 Long term (current) use of inhaled steroids: Secondary | ICD-10-CM | POA: Insufficient documentation

## 2023-11-09 DIAGNOSIS — J040 Acute laryngitis: Secondary | ICD-10-CM | POA: Diagnosis not present

## 2023-11-09 DIAGNOSIS — R059 Cough, unspecified: Secondary | ICD-10-CM | POA: Diagnosis present

## 2023-11-09 DIAGNOSIS — R0602 Shortness of breath: Secondary | ICD-10-CM | POA: Diagnosis not present

## 2023-11-09 LAB — BASIC METABOLIC PANEL WITH GFR
Anion gap: 8 (ref 5–15)
BUN: 10 mg/dL (ref 6–20)
CO2: 23 mmol/L (ref 22–32)
Calcium: 9.1 mg/dL (ref 8.9–10.3)
Chloride: 107 mmol/L (ref 98–111)
Creatinine, Ser: 0.84 mg/dL (ref 0.44–1.00)
GFR, Estimated: >60 mL/min (ref >60)
Glucose, Bld: 111 mg/dL — ABNORMAL HIGH (ref 70–99)
Potassium: 4.2 mmol/L (ref 3.5–5.1)
Sodium: 138 mmol/L (ref 135–145)

## 2023-11-09 LAB — CBC
HCT: 34.7 % — ABNORMAL LOW (ref 36.0–46.0)
Hemoglobin: 9.9 g/dL — ABNORMAL LOW (ref 12.0–15.0)
MCH: 21.7 pg — ABNORMAL LOW (ref 26.0–34.0)
MCHC: 28.5 g/dL — ABNORMAL LOW (ref 30.0–36.0)
MCV: 75.9 fL — ABNORMAL LOW (ref 80.0–100.0)
Platelets: 230 10*3/uL (ref 150–400)
RBC: 4.57 MIL/uL (ref 3.87–5.11)
RDW: 22.6 % — ABNORMAL HIGH (ref 11.5–15.5)
WBC: 4.5 10*3/uL (ref 4.0–10.5)
nRBC: 0 % (ref 0.0–0.2)

## 2023-11-09 LAB — RESP PANEL BY RT-PCR (RSV, FLU A&B, COVID)  RVPGX2
Influenza A by PCR: NEGATIVE
Influenza B by PCR: NEGATIVE
Resp Syncytial Virus by PCR: NEGATIVE
SARS Coronavirus 2 by RT PCR: NEGATIVE

## 2023-11-09 MED ORDER — ALBUTEROL SULFATE HFA 108 (90 BASE) MCG/ACT IN AERS
1.0000 | INHALATION_SPRAY | Freq: Once | RESPIRATORY_TRACT | Status: AC
  Administered 2023-11-09: 1 via RESPIRATORY_TRACT
  Filled 2023-11-09: qty 6.7

## 2023-11-09 MED ORDER — AZITHROMYCIN 250 MG PO TABS: 250.0000 mg | ORAL_TABLET | Freq: Every day | ORAL | 0 refills | Status: DC

## 2023-11-09 MED ORDER — PREDNISONE 20 MG PO TABS: 40.0000 mg | ORAL_TABLET | Freq: Every day | ORAL | 0 refills | Status: DC

## 2023-11-09 MED ORDER — IPRATROPIUM-ALBUTEROL 0.5-2.5 (3) MG/3ML IN SOLN
3.0000 mL | Freq: Once | RESPIRATORY_TRACT | Status: AC
  Administered 2023-11-09: 3 mL via RESPIRATORY_TRACT
  Filled 2023-11-09: qty 3

## 2023-11-09 NOTE — ED Notes (Signed)
 This RN reviewed discharge instructions with patient. She verbalized understanding and denied any further questions. PT well appearing upon discharge and reports tolerable pain. Pt ambulated with stable gait to exit. Pt endorses ride home.

## 2023-11-09 NOTE — Discharge Instructions (Signed)
 You can use your inhaler as needed, please take the entire course of steroids, and antibiotics that are prescribed and follow-up closely with your primary care doctor.  Please return if your symptoms worsen despite treatment.

## 2023-11-09 NOTE — ED Provider Notes (Signed)
 [place] EMERGENCY DEPARTMENT AT [facility] Provider Note   CSN: [id number] Arrival date & time: 11/09/23  2725     History  Chief Complaint  Patient presents with   URI   Shortness of Breath         Alexis Beck is a 48 y.o. female with past medical history significant for cervical stenosis with central cord syndrome, asthma, anemia who presents with concern for having cough, congestion, cold for around 8 days.  She endorses some shortness of breath.  She reports she continues to have some chills.  She reports she is taking over-the-counter meds but symptoms are only getting worse.  She reports that she has been coughing up green phlegm.  She reports a headache around 2/10 and some neck stiffness.  She does not have an inhaler at home at this time.   URI Shortness of Breath      Home Medications Prior to Admission medications   Medication Sig Start Date End Date Taking? Authorizing Provider  Ascorbic Acid (VITAMIN C PO) Take 1 tablet by mouth daily.    [provider]  ferrous sulfate 325 (65 FE) MG tablet Take 1 tablet (325 mg total) by mouth daily. 10/12/22   Ward, Layla Maw, DO  MAGNESIUM PO Take 1 tablet by mouth daily.    [provider]  Multiple Vitamins-Minerals (ZINC PO) Take 1 tablet by mouth daily.    [provider]  predniSONE (DELTASONE) 20 MG tablet Take 2 tablets (40 mg total) by mouth daily. 08/27/23   Lue [place] H, PA-C  VITAMIN D PO Take 1 tablet by mouth daily.    [provider]  cetirizine (ZYRTEC) 10 MG tablet Take 1 tablet (10 mg total) by mouth daily. Patient not taking: Reported on 11/02/2019 10/17/17 11/02/19  Maczis, Elmer Sow, PA-C  fluticasone [facility]) 50 MCG/ACT nasal spray Place 2 sprays into both nostrils daily. Patient not taking: Reported on 11/02/2019 10/17/17 11/02/19  Jacinto Halim, PA-C      Allergies    Shellfish allergy    Review of Systems   Review of Systems  Respiratory:   Positive for shortness of breath.   All other systems reviewed and are negative.   Physical Exam Updated Vital Signs BP 126/75 (BP Location: Right Arm)   Pulse 92   Temp 99.1 F (37.3 C) (Oral)   Resp 18   Ht 5\' 4"  (1.626 m)   Wt 88.5 kg   SpO2 100%   BMI 33.47 kg/m  Physical Exam Vitals and nursing note reviewed.  Constitutional:      General: She is not in acute distress.    Appearance: Normal appearance.  HENT:     Head: Normocephalic and atraumatic.     Comments: High-pitched dysphonia consistent with laryngitis, no stridor. Eyes:     General:        Right eye: No discharge.        Left eye: No discharge.  Cardiovascular:     Rate and Rhythm: Normal rate and regular rhythm.     Heart sounds: No murmur heard.    No friction rub. No gallop.  Pulmonary:     Effort: Pulmonary effort is normal.     Breath sounds: Normal breath sounds.     Comments: Mild end expiratory wheeze in upper lung fields, no acute respiratory distress, she has frequent productive cough.  No rhonchi, no focal consolidation. Normal effort and no accessory breath sounds in lower lung  fields. Abdominal:     General: Bowel sounds are normal.     Palpations: Abdomen is soft.  Skin:    General: Skin is warm and dry.     Capillary Refill: Capillary refill takes less than 2 seconds.  Neurological:     Mental Status: She is alert and oriented to person, place, and time.  Psychiatric:        Mood and Affect: Mood normal.        Behavior: Behavior normal.     ED Results / Procedures / Treatments   Labs (all labs ordered are listed, but only abnormal results are displayed) Labs Reviewed - No data to display  EKG None  Radiology No results found.  Procedures Procedures    Medications Ordered in ED Medications  ipratropium-albuterol (DUONEB) 0.5-2.5 (3) MG/3ML nebulizer solution 3 mL (has no administration in time range)    ED Course/ Medical Decision Making/ A&P                                  Medical Decision Making Amount and/or Complexity of Data Reviewed Labs: ordered. Radiology: ordered.  Risk Prescription drug management.   This patient is a 48 y.o. female  who presents to the ED for concern of shob, cough.   Differential diagnoses prior to evaluation: The emergent differential diagnosis includes, but is not limited to,  asthma exacerbation, COPD exacerbation, acute upper respiratory infection, acute bronchitis, chronic bronchitis, interstitial lung disease, ARDS, PE, pneumonia, atypical ACS, carbon monoxide poisoning, spontaneous pneumothorax, new CHF vs CHF exacerbation, versus other . This is not an exhaustive differential.   Past Medical History / Co-morbidities / Social History: Cervical canal stenosis leading to central cord syndrome, asthma  Physical Exam: Physical exam performed. The pertinent findings include: Mild end expiratory wheeze in upper lung fields, no acute respiratory distress, she has frequent productive cough.  No rhonchi, no focal consolidation. Normal effort and no accessory breath sounds in lower lung fields. High-pitched dysphonia consistent with laryngitis, no stridor.   Lab Tests/Imaging studies: I personally interpreted labs/imaging and the pertinent results include: I independently interpreted plain film x-ray of the chest which shows no evidence of acute intrathoracic abnormality, no focal consolidation, CBC and BMP overall unremarkable. I agree with the radiologist interpretation.  Medications: I ordered medication including duoneb for mild wheeze, will plan to discharge with steroid burst, azithromycin for acute bronchitis vs early CAP.  I have reviewed the patients home medicines and have made adjustments as needed.   Disposition: After consideration of the diagnostic results and the patients response to treatment, I feel that patient is stable for discharge with plan as above .   emergency department workup does not suggest  an emergent condition requiring admission or immediate intervention beyond what has been performed at this time. The plan is: as above. The patient is safe for discharge and has been instructed to return immediately for worsening symptoms, change in symptoms or any other concerns.  Final Clinical Impression(s) / ED Diagnoses Final diagnoses:  None    Rx / DC Orders ED Discharge Orders     None         [place] 11/09/23 1221    Pricilla Loveless, MD 11/09/23 [phone]

## 2023-11-09 NOTE — ED Triage Notes (Addendum)
 Pt came in from home. Pt reports having cold for approximately 8 days. Pt started having SOB since being sick. Pt has been taking over counter meds at home and reports that symptoms are only getting worse. Pt reports mild headache 2/10.    HX asthma

## 2023-11-17 DIAGNOSIS — Z419 Encounter for procedure for purposes other than remedying health state, unspecified: Secondary | ICD-10-CM | POA: Diagnosis not present

## 2023-12-17 DIAGNOSIS — Z419 Encounter for procedure for purposes other than remedying health state, unspecified: Secondary | ICD-10-CM | POA: Diagnosis not present

## 2024-01-17 DIAGNOSIS — Z419 Encounter for procedure for purposes other than remedying health state, unspecified: Secondary | ICD-10-CM | POA: Diagnosis not present

## 2024-02-16 DIAGNOSIS — Z419 Encounter for procedure for purposes other than remedying health state, unspecified: Secondary | ICD-10-CM | POA: Diagnosis not present

## 2024-03-07 ENCOUNTER — Telehealth (INDEPENDENT_AMBULATORY_CARE_PROVIDER_SITE_OTHER): Payer: Self-pay | Admitting: Primary Care

## 2024-03-07 NOTE — Telephone Encounter (Signed)
 Called pt to reschedule appt. Pt did not answer and LVM

## 2024-03-10 ENCOUNTER — Ambulatory Visit (INDEPENDENT_AMBULATORY_CARE_PROVIDER_SITE_OTHER): Admitting: Primary Care

## 2024-03-18 DIAGNOSIS — Z419 Encounter for procedure for purposes other than remedying health state, unspecified: Secondary | ICD-10-CM | POA: Diagnosis not present

## 2024-04-18 DIAGNOSIS — Z419 Encounter for procedure for purposes other than remedying health state, unspecified: Secondary | ICD-10-CM | POA: Diagnosis not present

## 2024-04-20 ENCOUNTER — Ambulatory Visit (HOSPITAL_COMMUNITY)
Admission: EM | Admit: 2024-04-20 | Discharge: 2024-04-20 | Disposition: A | Discharge status: Home / Self Care | Attending: Emergency Medicine | Admitting: Emergency Medicine

## 2024-04-20 ENCOUNTER — Encounter (HOSPITAL_COMMUNITY): Payer: Self-pay | Admitting: Emergency Medicine

## 2024-04-20 DIAGNOSIS — R300 Dysuria: Secondary | ICD-10-CM | POA: Insufficient documentation

## 2024-04-20 DIAGNOSIS — N3000 Acute cystitis without hematuria: Secondary | ICD-10-CM | POA: Insufficient documentation

## 2024-04-20 LAB — POCT URINALYSIS DIP (MANUAL ENTRY)
Bilirubin, UA: NEGATIVE
Glucose, UA: NEGATIVE mg/dL
Ketones, POC UA: NEGATIVE mg/dL
Leukocytes, UA: NEGATIVE
Nitrite, UA: NEGATIVE
Protein Ur, POC: NEGATIVE mg/dL
Spec Grav, UA: 1.025 (ref 1.010–1.025)
Urobilinogen, UA: 0.2 U/dL
pH, UA: 6 (ref 5.0–8.0)

## 2024-04-20 LAB — POCT URINE PREGNANCY: Preg Test, Ur: NEGATIVE

## 2024-04-20 MED ORDER — SULFAMETHOXAZOLE-TRIMETHOPRIM 800-160 MG PO TABS
1.0000 | ORAL_TABLET | Freq: Two times a day (BID) | ORAL | 0 refills | Status: AC
Start: 2024-04-20 — End: 2024-04-23

## 2024-04-20 NOTE — ED Triage Notes (Signed)
 Pt st's she started having symptoms of UTI approx 9-10 days ago  St's [facility] and [facility]    Pt st's while taking the antibiotic the symptoms started to subside but st's now they are returning

## 2024-04-20 NOTE — ED Provider Notes (Signed)
 [facility]    CSN: [id number] Arrival date & time: 04/20/24  1153      History   Chief Complaint Chief Complaint  Patient presents with   Possible UTI    HPI Alexis Beck is a 48 y.o. female.   Patient presents to clinic over concern of urgency, frequency and nocturia.  Symptoms started around 10 days ago and she did a telehealth visit where she was prescribed Keflex .  While on the Keflex  her symptoms improved, but the night she stopped it she noted she was up and down to pee all night long.  Having urgency and frequency.  Symptoms seem to be a little bit better throughout the day, but worse at night.  Mild flank pain.  Some nausea, no vomiting.  No fevers.  The history is provided by the patient and medical records.    Past Medical History:  Diagnosis Date   Anxiety    Asthma    Bipolar disorder (HCC)    Depression    Fibroids    Diagnosed around age 33    Patient Active Problem List   Diagnosis Date Noted   Acute lower UTI 05/31/2020   UTI (urinary tract infection) 05/31/2020   Iron deficiency anemia due to chronic blood loss 10/22/2017   Mild intermittent asthma with exacerbation 10/22/2017   Central cord syndrome (HCC) 10/19/2017   Cervical disc herniation 10/19/2017   Cervical stenosis of spinal canal 10/19/2017   Paresthesia and pain of both upper extremities 10/19/2017   Syncope and collapse 10/18/2017   Anaphylaxis 03/08/2013   Asthma 03/08/2013   Depression 02/12/2012   Anxiety 02/12/2012    Past Surgical History:  Procedure Laterality Date   NECK SURGERY  10/2017   TUBAL LIGATION  2003    OB History     Gravida  4   Para  3   Term  3   Preterm  0   AB  1   Living  3      SAB  0   IAB  1   Ectopic  0   Multiple  0   Live Births  3            Home Medications    Prior to Admission medications   Medication Sig Start Date End Date Taking? Authorizing Provider  sulfamethoxazole -trimethoprim  (BACTRIM  DS)  800-160 MG tablet Take 1 tablet by mouth 2 (two) times daily for 3 days. 04/20/24 04/23/24 Yes Kadisha Goodine  N, FNP  Ascorbic Acid (VITAMIN C PO) Take 1 tablet by mouth daily.    [provider]  azithromycin  (ZITHROMAX ) 250 MG tablet Take 1 tablet (250 mg total) by mouth daily. Take first 2 tablets together, then 1 every day until finished. Patient not taking: Reported on 04/20/2024 11/09/23   Prosperi, Christian H, PA-C  ferrous sulfate  325 (65 FE) MG tablet Take 1 tablet (325 mg total) by mouth daily. 10/12/22   Ward, Josette SAILOR, DO  MAGNESIUM PO Take 1 tablet by mouth daily.    [provider]  Multiple Vitamins-Minerals (ZINC PO) Take 1 tablet by mouth daily.    [provider]  predniSONE  (DELTASONE ) 20 MG tablet Take 2 tablets (40 mg total) by mouth daily. Patient not taking: Reported on 04/20/2024 11/09/23   Prosperi, Christian H, PA-C  VITAMIN D PO Take 1 tablet by mouth daily.    [provider]  cetirizine  (ZYRTEC ) 10 MG tablet Take 1 tablet (10 mg total) by mouth daily.  Patient not taking: Reported on 11/02/2019 10/17/17 11/02/19  Maczis, Michael M, PA-C  fluticasone  (FLONASE ) 50 MCG/ACT nasal spray Place 2 sprays into both nostrils daily. Patient not taking: Reported on 11/02/2019 10/17/17 11/02/19  Maczis, Ozell HERO, PA-C    Family History Family History  Problem Relation Age of Onset   Drug abuse Mother    Drug abuse Father    Depression Brother    Depression Maternal Grandmother    Schizophrenia Cousin     Social History Social History   Tobacco Use   Smoking status: Never   Smokeless tobacco: Never  Vaping Use   Vaping status: Never Used  Substance Use Topics   Alcohol use: No   Drug use: No     Allergies   Shellfish allergy   Review of Systems Review of Systems  Per HPI  Physical Exam Triage Vital Signs ED Triage Vitals [04/20/24 1251]  Encounter Vitals Group     BP (!) 149/81     Girls Systolic BP Percentile      Girls  Diastolic BP Percentile      Boys Systolic BP Percentile      Boys Diastolic BP Percentile      Pulse Rate 86     Resp 16     Temp (!) 97.4 F (36.3 C)     Temp src      SpO2 98 %     Weight      Height      Head Circumference      Peak Flow      Pain Score 5     Pain Loc      Pain Education      Exclude from Growth Chart    No data found.  Updated Vital Signs BP (!) 149/81 (BP Location: Left Arm)   Pulse 86   Temp (!) 97.4 F (36.3 C)   Resp 16   LMP 04/16/2024 (Exact Date)   SpO2 98%   Visual Acuity Right Eye Distance:   Left Eye Distance:   Bilateral Distance:    Right Eye Near:   Left Eye Near:    Bilateral Near:     Physical Exam Vitals and nursing note reviewed.  Constitutional:      Appearance: Normal appearance.  HENT:     Head: Normocephalic and atraumatic.     Right Ear: External ear normal.     Left Ear: External ear normal.     Nose: Nose normal.     Mouth/Throat:     Mouth: Mucous membranes are moist.  Eyes:     Conjunctiva/sclera: Conjunctivae normal.  Pulmonary:     Effort: Pulmonary effort is normal. No respiratory distress.  Abdominal:     Tenderness: There is no right CVA tenderness or left CVA tenderness.  Skin:    General: Skin is warm and dry.  Neurological:     General: No focal deficit present.     Mental Status: She is alert and oriented to person, place, and time.  Psychiatric:        Mood and Affect: Mood normal.        Behavior: Behavior normal.      UC Treatments / Results  Labs (all labs ordered are listed, but only abnormal results are displayed) Labs Reviewed  POCT URINALYSIS DIP (MANUAL ENTRY) - Abnormal; Notable for the following components:      Result Value   Blood, UA trace-lysed (*)    All other components within  normal limits  URINE CULTURE  POCT URINE PREGNANCY    EKG   Radiology No results found.  Procedures Procedures (including critical care time)  Medications Ordered in UC Medications -  No data to display  Initial Impression / Assessment and Plan / UC Course  I have reviewed the triage vital signs and the nursing notes.  Pertinent labs & imaging results that were available during my care of the patient were reviewed by me and considered in my medical decision making (see chart for details).  Vitals and triage reviewed, patient is hemodynamically stable.  Afebrile, without tachycardia negative for CVA tenderness, low concern for pyelonephritis.  Urine pregnancy negative.  UA with blood, patient is on her cycle.  Concerning for rebound symptoms after Keflex  w/ urgency, frequency, dysuria, nausea and flank pain.  Urine will be sent for culture and treated empirically for acute cystitis with Bactrim .  Plan of care, follow-up care return precautions given, no questions at this time.     Final Clinical Impressions(s) / UC Diagnoses   Final diagnoses:  Dysuria  Acute cystitis without hematuria     Discharge Instructions      Take the Bactrim  twice daily for the next 3 days.  Ensure you are drinking at least 64 ounces of water daily.  We are sending your urine off for culture and we will contact you if we need to modify your antibiotic therapy.  Return to clinic for new or urgent symptoms such as fever, flank pain, vomiting, or no improvement.  Follow-up with your primary care provider if symptoms persist.     ED Prescriptions     Medication Sig Dispense Auth. Provider   sulfamethoxazole -trimethoprim  (BACTRIM  DS) 800-160 MG tablet Take 1 tablet by mouth 2 (two) times daily for 3 days. 6 tablet Alexis, Reo Portela  N, FNP      PDMP not reviewed this encounter.   Alexis Beck  N, FNP 04/20/24 1338

## 2024-04-20 NOTE — Discharge Instructions (Signed)
 Take the Bactrim  twice daily for the next 3 days.  Ensure you are drinking at least 64 ounces of water daily.  We are sending your urine off for culture and we will contact you if we need to modify your antibiotic therapy.  Return to clinic for new or urgent symptoms such as fever, flank pain, vomiting, or no improvement.  Follow-up with your primary care provider if symptoms persist.

## 2024-04-21 LAB — URINE CULTURE: Culture: NO GROWTH

## 2024-04-22 ENCOUNTER — Ambulatory Visit (HOSPITAL_COMMUNITY): Payer: Self-pay

## 2024-07-18 DIAGNOSIS — Z419 Encounter for procedure for purposes other than remedying health state, unspecified: Secondary | ICD-10-CM | POA: Diagnosis not present

## 2024-08-21 ENCOUNTER — Telehealth (INDEPENDENT_AMBULATORY_CARE_PROVIDER_SITE_OTHER): Payer: Self-pay | Admitting: Primary Care

## 2024-08-21 NOTE — Telephone Encounter (Signed)
 Left VM with pt about their upcoming appt. Pt did not answer

## 2024-08-22 ENCOUNTER — Encounter (INDEPENDENT_AMBULATORY_CARE_PROVIDER_SITE_OTHER): Payer: Self-pay | Admitting: Primary Care

## 2024-08-22 ENCOUNTER — Ambulatory Visit (INDEPENDENT_AMBULATORY_CARE_PROVIDER_SITE_OTHER): Admitting: Primary Care

## 2024-08-22 VITALS — BP 126/72 | HR 80 | Resp 16 | Ht 64.0 in | Wt 180.0 lb

## 2024-08-22 DIAGNOSIS — D500 Iron deficiency anemia secondary to blood loss (chronic): Secondary | ICD-10-CM

## 2024-08-22 DIAGNOSIS — J4521 Mild intermittent asthma with (acute) exacerbation: Secondary | ICD-10-CM | POA: Diagnosis not present

## 2024-08-22 DIAGNOSIS — R7309 Other abnormal glucose: Secondary | ICD-10-CM

## 2024-08-22 DIAGNOSIS — Z1231 Encounter for screening mammogram for malignant neoplasm of breast: Secondary | ICD-10-CM

## 2024-08-22 DIAGNOSIS — D219 Benign neoplasm of connective and other soft tissue, unspecified: Secondary | ICD-10-CM

## 2024-08-22 DIAGNOSIS — Z1211 Encounter for screening for malignant neoplasm of colon: Secondary | ICD-10-CM

## 2024-08-22 DIAGNOSIS — Z13220 Encounter for screening for lipoid disorders: Secondary | ICD-10-CM

## 2024-08-22 DIAGNOSIS — Z760 Encounter for issue of repeat prescription: Secondary | ICD-10-CM

## 2024-08-22 DIAGNOSIS — Z7689 Persons encountering health services in other specified circumstances: Secondary | ICD-10-CM

## 2024-08-22 DIAGNOSIS — Z1159 Encounter for screening for other viral diseases: Secondary | ICD-10-CM

## 2024-08-22 MED ORDER — ALBUTEROL SULFATE HFA 108 (90 BASE) MCG/ACT IN AERS: 1.0000 | INHALATION_SPRAY | Freq: Four times a day (QID) | RESPIRATORY_TRACT | 2 refills | Status: AC | PRN

## 2024-08-22 NOTE — Progress Notes (Signed)
 "  New Patient Office Visit  Subjective    Patient ID: Alexis Beck female  DOB: 1976-06-06  Age: 49 y.o. MRN: [id number]   CC:  Patient is concerned about fibroid tumors last checked in dimensions done was last year they are uncomfortable. HPI     New Patient (Initial Visit)    Additional comments: Establish care  Fibroid tumors  Pt states she does have low iron because her menstruals are heavy   Pt states she has asthma and is requesting a rx for albuterol        Last edited by Casimir Juvenal SAUNDERS, RMA on 08/22/2024  9:05 AM.      HPI  Alexis Beck is a 49 year old obese female in today to transfer care and establish care at [facility].  Blood pressure is slightly elevated will recheck and discussed ways to monitor blood pressure.  Medications Ordered Prior to Encounter[1]   Allergies[2]  Past Medical History:  Diagnosis Date   Anxiety    Asthma    Bipolar disorder (HCC)    Depression    Fibroids    Diagnosed around age 30     Past Surgical History:  Procedure Laterality Date   NECK SURGERY  10/2017   TUBAL LIGATION  2003     Family History  Problem Relation Age of Onset   Drug abuse Mother    Drug abuse Father    Depression Brother    Depression Maternal Grandmother    Schizophrenia Cousin     Social History   Socioeconomic History   Marital status: Married    Spouse name: Not on file   Number of children: Not on file   Years of education: Not on file   Highest education level: Not on file  Occupational History   Not on file  Tobacco Use   Smoking status: Never   Smokeless tobacco: Never  Vaping Use   Vaping status: Never Used  Substance and Sexual Activity   Alcohol use: No   Drug use: No   Sexual activity: Yes    Birth control/protection: Surgical  Other Topics Concern   Not on file  Social History Narrative   Not on file   Social Drivers of Health   Tobacco Use: Low Risk (08/22/2024)   Patient History     Smoking Tobacco Use: Never    Smokeless Tobacco Use: Never    Passive Exposure: Not on file  Financial Resource Strain: Not on file  Food Insecurity: Not on file  Transportation Needs: Unmet Transportation Needs (08/22/2024)   Epic    Lack of Transportation (Medical): Yes    Lack of Transportation (Non-Medical): Yes  Physical Activity: Not on file  Stress: Not on file  Social Connections: Not on file  Intimate Partner Violence: Not on file  Depression (PHQ2-9): Low Risk (08/22/2024)   Depression (PHQ2-9)    PHQ-2 Score: 1  Alcohol Screen: Not on file  Housing: Not on file  Utilities: At Risk (08/22/2024)   Epic    Threatened with loss of utilities: Yes  Health Literacy: Not on file    SDOH Interventions Today    Flowsheet Row Most Recent Value  SDOH Interventions   Transportation Interventions AMB Referral  Utilities Interventions AMB Referral     Health Maintenance  Topic Date Due   DTaP/Tdap/Td vaccine (1 - Tdap) Never done   Hepatitis B Vaccine (1 of 3 - 19+ 3-dose series) Never done   Pap with HPV screening  Never done   Pneumococcal Vaccine (2 of 2 - PCV) 03/09/2014   Breast Cancer Screening  Never done   Colon Cancer Screening  Never done   Flu Shot  Never done   COVID-19 Vaccine (1 - 2025-26 season) Never done   HPV Vaccine (No Doses Required) Completed   Hepatitis C Screening  Completed   HIV Screening  Completed   Meningitis B Vaccine  Aged Out    Objective    BP 126/72   Pulse 80   Resp 16   Ht 5' 4 (1.626 m)   Wt 180 lb (81.6 kg)   LMP 08/04/2024   SpO2 100%   BMI 30.90 kg/m  BP Readings from Last 3 Encounters:  08/22/24 126/72  04/20/24 (!) 149/81  11/09/23 137/69       Physical Exam Vitals reviewed.  Constitutional:      Appearance: Normal appearance. She is obese.  HENT:     Head: Normocephalic.     Right Ear: Tympanic membrane, ear canal and external ear normal.     Left Ear: Tympanic membrane, ear canal and external ear normal.      Nose: Nose normal.     Mouth/Throat:     Mouth: Mucous membranes are moist.  Eyes:     Extraocular Movements: Extraocular movements intact.     Pupils: Pupils are equal, round, and reactive to light.  Cardiovascular:     Rate and Rhythm: Normal rate and regular rhythm.  Pulmonary:     Effort: Pulmonary effort is normal.     Breath sounds: Normal breath sounds.  Abdominal:     General: Bowel sounds are normal.     Palpations: Abdomen is soft.  Musculoskeletal:        General: Normal range of motion.     Cervical back: Normal range of motion and neck supple.  Skin:    General: Skin is warm and dry.  Neurological:     Mental Status: She is alert and oriented to person, place, and time.  Psychiatric:        Mood and Affect: Mood normal.        Behavior: Behavior normal.        Thought Content: Thought content normal.        Judgment: Judgment normal.    Assessment & Plan:  Chyenne was seen today for new patient (initial visit).  Diagnoses and all orders for this visit:  Encounter to establish care  Screening for colon cancer -     Ambulatory referral to Gastroenterology  Encounter for screening mammogram for malignant neoplasm of breast -     MM 3D SCREENING MAMMOGRAM BILATERAL BREAST; Future  Elevated glucose -     CBC with Differential/Platelet -     CMP14+EGFR -     Hemoglobin A1c  Iron deficiency anemia due to chronic blood loss -     Iron, TIBC and Ferritin Panel  Lipid screening -     Lipid panel  Need for hepatitis B screening test -     Hepatitis B surface antibody,qualitative  Fibroid tumor -     Ambulatory referral to Gynecology  Mild intermittent asthma with exacerbation Manage with SABA  Other orders 2/2 Medication refill  -     albuterol  (VENTOLIN  HFA) 108 (90 Base) MCG/ACT inhaler; Inhale 1-2 puffs into the lungs every 6 (six) hours as needed for wheezing or shortness of breath.      Follow-up:  Return in about 6 months (  around  02/19/2025).  The above assessment and management plan was discussed with the patient. The patient verbalized understanding of and has agreed to the management plan. Patient is aware to call the clinic if symptoms fail to improve or worsen. Patient is aware when to return to the clinic for a follow-up visit. Patient educated on when it is appropriate to go to the emergency department.   Rosaline Bohr, NP-C     [1]  Current Outpatient Medications on File Prior to Visit  Medication Sig Dispense Refill   Ascorbic Acid (VITAMIN C PO) Take 1 tablet by mouth daily.     ferrous sulfate  325 (65 FE) MG tablet Take 1 tablet (325 mg total) by mouth daily. 120 tablet 3   MAGNESIUM PO Take 1 tablet by mouth daily.     Multiple Vitamins-Minerals (ZINC PO) Take 1 tablet by mouth daily.     VITAMIN D PO Take 1 tablet by mouth daily.     No current facility-administered medications on file prior to visit.  [2]  Allergies Allergen Reactions   Shellfish Allergy Anaphylaxis and Hives   "

## 2024-08-23 LAB — CBC WITH DIFFERENTIAL/PLATELET
Basophils Absolute: 0 x10E3/uL (ref 0.0–0.2)
Basos: 1 %
EOS (ABSOLUTE): 0.1 x10E3/uL (ref 0.0–0.4)
Eos: 4 %
Hematocrit: 30.6 % — ABNORMAL LOW (ref 34.0–46.6)
Hemoglobin: 8.2 g/dL — ABNORMAL LOW (ref 11.1–15.9)
Immature Grans (Abs): 0 x10E3/uL (ref 0.0–0.1)
Immature Granulocytes: 0 %
Lymphocytes Absolute: 0.9 x10E3/uL (ref 0.7–3.1)
Lymphs: 28 %
MCH: 18.6 pg — ABNORMAL LOW (ref 26.6–33.0)
MCHC: 26.8 g/dL — ABNORMAL LOW (ref 31.5–35.7)
MCV: 69 fL — ABNORMAL LOW (ref 79–97)
Monocytes Absolute: 0.3 x10E3/uL (ref 0.1–0.9)
Monocytes: 10 %
Neutrophils Absolute: 1.8 x10E3/uL (ref 1.4–7.0)
Neutrophils: 56 %
Platelets: 243 x10E3/uL (ref 150–450)
RBC: 4.42 x10E6/uL (ref 3.77–5.28)
RDW: 19.7 % — ABNORMAL HIGH (ref 11.7–15.4)
WBC: 3.1 x10E3/uL — ABNORMAL LOW (ref 3.4–10.8)

## 2024-08-23 LAB — CMP14+EGFR
ALT: 11 IU/L (ref 0–32)
AST: 17 IU/L (ref 0–40)
Albumin: 4.7 g/dL (ref 3.9–4.9)
Alkaline Phosphatase: 46 IU/L (ref 41–116)
BUN/Creatinine Ratio: 14 (ref 9–23)
BUN: 10 mg/dL (ref 6–24)
Bilirubin Total: 0.6 mg/dL (ref 0.0–1.2)
CO2: 22 mmol/L (ref 20–29)
Calcium: 8.9 mg/dL (ref 8.7–10.2)
Chloride: 105 mmol/L (ref 96–106)
Creatinine, Ser: 0.73 mg/dL (ref 0.57–1.00)
Globulin, Total: 2 g/dL (ref 1.5–4.5)
Glucose: 89 mg/dL (ref 70–99)
Potassium: 4.6 mmol/L (ref 3.5–5.2)
Sodium: 140 mmol/L (ref 134–144)
Total Protein: 6.7 g/dL (ref 6.0–8.5)
eGFR: 101 mL/min/1.73

## 2024-08-23 LAB — LIPID PANEL
Chol/HDL Ratio: 3.1 ratio (ref 0.0–4.4)
Cholesterol, Total: 203 mg/dL — ABNORMAL HIGH (ref 100–199)
HDL: 65 mg/dL
LDL Chol Calc (NIH): 123 mg/dL — ABNORMAL HIGH (ref 0–99)
Triglycerides: 81 mg/dL (ref 0–149)
VLDL Cholesterol Cal: 15 mg/dL (ref 5–40)

## 2024-08-23 LAB — IRON,TIBC AND FERRITIN PANEL
Ferritin: 5 ng/mL — ABNORMAL LOW (ref 15–150)
Iron Saturation: 4 % — CL (ref 15–55)
Iron: 17 ug/dL — ABNORMAL LOW (ref 27–159)
Total Iron Binding Capacity: 421 ug/dL (ref 250–450)
UIBC: 404 ug/dL (ref 131–425)

## 2024-08-23 LAB — HEPATITIS B SURFACE ANTIBODY,QUALITATIVE: Hep B Surface Ab, Qual: NONREACTIVE

## 2024-08-23 LAB — HEMOGLOBIN A1C
Est. average glucose Bld gHb Est-mCnc: 126 mg/dL
Hgb A1c MFr Bld: 6 % — ABNORMAL HIGH (ref 4.8–5.6)

## 2024-08-25 ENCOUNTER — Ambulatory Visit (INDEPENDENT_AMBULATORY_CARE_PROVIDER_SITE_OTHER): Payer: Self-pay | Admitting: Primary Care

## 2024-08-25 DIAGNOSIS — E782 Mixed hyperlipidemia: Secondary | ICD-10-CM

## 2024-08-25 DIAGNOSIS — D649 Anemia, unspecified: Secondary | ICD-10-CM

## 2024-08-25 MED ORDER — FERROUS SULFATE 325 (65 FE) MG PO TABS: 325.0000 mg | ORAL_TABLET | Freq: Every day | ORAL | 3 refills | Status: AC

## 2024-08-25 MED ORDER — ATORVASTATIN CALCIUM 20 MG PO TABS: 20.0000 mg | ORAL_TABLET | Freq: Every day | ORAL | 3 refills | Status: AC

## 2024-08-26 ENCOUNTER — Encounter (INDEPENDENT_AMBULATORY_CARE_PROVIDER_SITE_OTHER): Payer: Self-pay

## 2024-10-03 ENCOUNTER — Ambulatory Visit (INDEPENDENT_AMBULATORY_CARE_PROVIDER_SITE_OTHER): Payer: Self-pay | Admitting: Primary Care

## 2025-02-20 ENCOUNTER — Ambulatory Visit (INDEPENDENT_AMBULATORY_CARE_PROVIDER_SITE_OTHER): Payer: Self-pay | Admitting: Primary Care
